# Patient Record
Sex: Male | Born: 1950 | Race: Black or African American | Hispanic: No | Marital: Single | Smoking: Never smoker
Health system: Southern US, Community
[De-identification: ages and names within clinical notes are randomized; demographics above are authoritative.]

## PROBLEM LIST (undated history)

## (undated) DIAGNOSIS — G629 Polyneuropathy, unspecified: Secondary | ICD-10-CM

## (undated) DIAGNOSIS — Z9989 Dependence on other enabling machines and devices: Secondary | ICD-10-CM

## (undated) DIAGNOSIS — I428 Other cardiomyopathies: Secondary | ICD-10-CM

## (undated) DIAGNOSIS — F329 Major depressive disorder, single episode, unspecified: Secondary | ICD-10-CM

## (undated) DIAGNOSIS — E875 Hyperkalemia: Secondary | ICD-10-CM

## (undated) DIAGNOSIS — R350 Frequency of micturition: Secondary | ICD-10-CM

## (undated) DIAGNOSIS — N400 Enlarged prostate without lower urinary tract symptoms: Secondary | ICD-10-CM

## (undated) DIAGNOSIS — G4733 Obstructive sleep apnea (adult) (pediatric): Secondary | ICD-10-CM

## (undated) DIAGNOSIS — N289 Disorder of kidney and ureter, unspecified: Secondary | ICD-10-CM

## (undated) DIAGNOSIS — E119 Type 2 diabetes mellitus without complications: Secondary | ICD-10-CM

## (undated) DIAGNOSIS — I10 Essential (primary) hypertension: Secondary | ICD-10-CM

## (undated) DIAGNOSIS — I5022 Chronic systolic (congestive) heart failure: Secondary | ICD-10-CM

## (undated) DIAGNOSIS — I509 Heart failure, unspecified: Secondary | ICD-10-CM

## (undated) DIAGNOSIS — R3915 Urgency of urination: Secondary | ICD-10-CM

## (undated) DIAGNOSIS — M2550 Pain in unspecified joint: Secondary | ICD-10-CM

## (undated) DIAGNOSIS — G4730 Sleep apnea, unspecified: Secondary | ICD-10-CM

## (undated) DIAGNOSIS — I4891 Unspecified atrial fibrillation: Principal | ICD-10-CM

## (undated) DIAGNOSIS — J9620 Acute and chronic respiratory failure, unspecified whether with hypoxia or hypercapnia: Secondary | ICD-10-CM

## (undated) DIAGNOSIS — I5032 Chronic diastolic (congestive) heart failure: Secondary | ICD-10-CM

## (undated) DIAGNOSIS — I259 Chronic ischemic heart disease, unspecified: Secondary | ICD-10-CM

## (undated) DIAGNOSIS — I2510 Atherosclerotic heart disease of native coronary artery without angina pectoris: Secondary | ICD-10-CM

## (undated) DIAGNOSIS — E785 Hyperlipidemia, unspecified: Secondary | ICD-10-CM

## (undated) DIAGNOSIS — F32A Depression, unspecified: Secondary | ICD-10-CM

## (undated) DIAGNOSIS — E0590 Thyrotoxicosis, unspecified without thyrotoxic crisis or storm: Secondary | ICD-10-CM

## (undated) HISTORY — DX: Atherosclerotic heart disease of native coronary artery without angina pectoris: I25.10

## (undated) HISTORY — DX: Sleep apnea, unspecified: G47.30

## (undated) HISTORY — DX: Disorder of kidney and ureter, unspecified: N28.9

## (undated) HISTORY — DX: Depression, unspecified: F32.A

## (undated) HISTORY — DX: Chronic ischemic heart disease, unspecified: I25.9

## (undated) HISTORY — DX: Major depressive disorder, single episode, unspecified: F32.9

## (undated) HISTORY — DX: Hyperkalemia: E87.5

## (undated) HISTORY — DX: Essential (primary) hypertension: I10

## (undated) HISTORY — DX: Hyperlipidemia, unspecified: E78.5

## (undated) HISTORY — DX: Unspecified atrial fibrillation: I48.91

## (undated) HISTORY — DX: Heart failure, unspecified: I50.9

## (undated) HISTORY — DX: Other cardiomyopathies: I42.8

## (undated) HISTORY — DX: Thyrotoxicosis, unspecified without thyrotoxic crisis or storm: E05.90

## (undated) HISTORY — DX: Chronic diastolic (congestive) heart failure: I50.32

## (undated) HISTORY — DX: Benign prostatic hyperplasia without lower urinary tract symptoms: N40.0

## (undated) HISTORY — DX: Acute and chronic respiratory failure, unspecified whether with hypoxia or hypercapnia: J96.20

## (undated) HISTORY — DX: Chronic systolic (congestive) heart failure: I50.22

---

## 2009-07-20 HISTORY — PX: CARDIAC CATHETERIZATION: SHX172

## 2009-08-04 ENCOUNTER — Ambulatory Visit: Payer: Self-pay | Admitting: Internal Medicine

## 2009-08-04 ENCOUNTER — Inpatient Hospital Stay (HOSPITAL_COMMUNITY): Admission: EM | Admit: 2009-08-04 | Discharge: 2009-08-12 | Payer: Self-pay | Admitting: Emergency Medicine

## 2009-08-05 ENCOUNTER — Encounter (INDEPENDENT_AMBULATORY_CARE_PROVIDER_SITE_OTHER): Payer: Self-pay | Admitting: Internal Medicine

## 2009-08-12 ENCOUNTER — Encounter: Payer: Self-pay | Admitting: Physician Assistant

## 2009-08-15 ENCOUNTER — Telehealth: Payer: Self-pay | Admitting: Internal Medicine

## 2009-08-15 ENCOUNTER — Encounter: Payer: Self-pay | Admitting: Internal Medicine

## 2009-08-26 ENCOUNTER — Telehealth: Payer: Self-pay | Admitting: Internal Medicine

## 2009-08-26 ENCOUNTER — Ambulatory Visit: Payer: Self-pay | Admitting: Physician Assistant

## 2009-08-26 DIAGNOSIS — I10 Essential (primary) hypertension: Secondary | ICD-10-CM | POA: Insufficient documentation

## 2009-08-26 DIAGNOSIS — E785 Hyperlipidemia, unspecified: Secondary | ICD-10-CM

## 2009-08-26 DIAGNOSIS — G4730 Sleep apnea, unspecified: Secondary | ICD-10-CM | POA: Insufficient documentation

## 2009-08-26 DIAGNOSIS — N259 Disorder resulting from impaired renal tubular function, unspecified: Secondary | ICD-10-CM | POA: Insufficient documentation

## 2009-08-26 DIAGNOSIS — I482 Chronic atrial fibrillation, unspecified: Secondary | ICD-10-CM

## 2009-08-26 DIAGNOSIS — I2510 Atherosclerotic heart disease of native coronary artery without angina pectoris: Secondary | ICD-10-CM | POA: Insufficient documentation

## 2009-08-26 DIAGNOSIS — I5022 Chronic systolic (congestive) heart failure: Secondary | ICD-10-CM

## 2009-08-26 DIAGNOSIS — E119 Type 2 diabetes mellitus without complications: Secondary | ICD-10-CM

## 2009-08-26 DIAGNOSIS — I4891 Unspecified atrial fibrillation: Secondary | ICD-10-CM

## 2009-08-26 HISTORY — DX: Sleep apnea, unspecified: G47.30

## 2009-08-26 HISTORY — DX: Chronic systolic (congestive) heart failure: I50.22

## 2009-08-26 HISTORY — DX: Unspecified atrial fibrillation: I48.91

## 2009-08-26 LAB — CONVERTED CEMR LAB: Hgb A1c MFr Bld: 7.4 %

## 2009-08-27 ENCOUNTER — Encounter: Payer: Self-pay | Admitting: Physician Assistant

## 2009-08-27 LAB — CONVERTED CEMR LAB
CO2: 22 meq/L (ref 19–32)
Calcium: 8.8 mg/dL (ref 8.4–10.5)
Glucose, Bld: 141 mg/dL — ABNORMAL HIGH (ref 70–99)
Potassium: 4.4 meq/L (ref 3.5–5.3)
Sodium: 143 meq/L (ref 135–145)

## 2009-08-31 ENCOUNTER — Encounter: Payer: Self-pay | Admitting: Internal Medicine

## 2009-09-01 ENCOUNTER — Encounter: Payer: Self-pay | Admitting: Internal Medicine

## 2009-09-02 ENCOUNTER — Telehealth: Payer: Self-pay | Admitting: Internal Medicine

## 2009-09-03 ENCOUNTER — Ambulatory Visit: Payer: Self-pay | Admitting: Internal Medicine

## 2009-09-12 ENCOUNTER — Telehealth: Payer: Self-pay | Admitting: Physician Assistant

## 2009-09-16 ENCOUNTER — Telehealth: Payer: Self-pay | Admitting: Physician Assistant

## 2009-09-23 ENCOUNTER — Telehealth (INDEPENDENT_AMBULATORY_CARE_PROVIDER_SITE_OTHER): Payer: Self-pay | Admitting: *Deleted

## 2009-09-23 ENCOUNTER — Ambulatory Visit: Payer: Self-pay | Admitting: Internal Medicine

## 2009-09-23 ENCOUNTER — Ambulatory Visit: Payer: Self-pay | Admitting: Physician Assistant

## 2009-09-23 DIAGNOSIS — R059 Cough, unspecified: Secondary | ICD-10-CM | POA: Insufficient documentation

## 2009-09-23 DIAGNOSIS — R05 Cough: Secondary | ICD-10-CM | POA: Insufficient documentation

## 2009-10-07 ENCOUNTER — Ambulatory Visit: Payer: Self-pay | Admitting: Physician Assistant

## 2009-10-07 ENCOUNTER — Encounter: Payer: Self-pay | Admitting: Physician Assistant

## 2009-10-07 DIAGNOSIS — R079 Chest pain, unspecified: Secondary | ICD-10-CM

## 2009-10-07 LAB — CONVERTED CEMR LAB
BUN: 17 mg/dL (ref 6–23)
CO2: 23 meq/L (ref 19–32)
Chloride: 101 meq/L (ref 96–112)
Creatinine, Ser: 1.06 mg/dL (ref 0.40–1.50)
Potassium: 4 meq/L (ref 3.5–5.3)

## 2009-10-08 ENCOUNTER — Encounter: Payer: Self-pay | Admitting: Physician Assistant

## 2009-10-08 ENCOUNTER — Ambulatory Visit: Payer: Self-pay | Admitting: Internal Medicine

## 2009-10-08 DIAGNOSIS — I259 Chronic ischemic heart disease, unspecified: Secondary | ICD-10-CM

## 2009-10-08 HISTORY — DX: Chronic ischemic heart disease, unspecified: I25.9

## 2009-10-17 ENCOUNTER — Encounter: Payer: Self-pay | Admitting: Physician Assistant

## 2009-10-19 ENCOUNTER — Encounter: Payer: Self-pay | Admitting: Physician Assistant

## 2009-10-21 ENCOUNTER — Encounter: Payer: Self-pay | Admitting: Physician Assistant

## 2009-10-27 ENCOUNTER — Encounter: Payer: Self-pay | Admitting: Physician Assistant

## 2009-11-11 ENCOUNTER — Encounter: Payer: Self-pay | Admitting: Physician Assistant

## 2009-12-08 ENCOUNTER — Telehealth: Payer: Self-pay | Admitting: Physician Assistant

## 2009-12-10 ENCOUNTER — Ambulatory Visit: Payer: Self-pay | Admitting: Physician Assistant

## 2009-12-10 DIAGNOSIS — L03019 Cellulitis of unspecified finger: Secondary | ICD-10-CM

## 2009-12-10 LAB — CONVERTED CEMR LAB: Hgb A1c MFr Bld: 7.7 %

## 2009-12-11 ENCOUNTER — Encounter: Payer: Self-pay | Admitting: Physician Assistant

## 2009-12-15 ENCOUNTER — Ambulatory Visit: Payer: Self-pay | Admitting: Physician Assistant

## 2009-12-15 ENCOUNTER — Telehealth: Payer: Self-pay | Admitting: Physician Assistant

## 2010-01-16 ENCOUNTER — Encounter: Payer: Self-pay | Admitting: Physician Assistant

## 2010-01-16 ENCOUNTER — Telehealth: Payer: Self-pay | Admitting: Physician Assistant

## 2010-02-11 ENCOUNTER — Telehealth: Payer: Self-pay | Admitting: Physician Assistant

## 2010-02-12 ENCOUNTER — Encounter (INDEPENDENT_AMBULATORY_CARE_PROVIDER_SITE_OTHER): Payer: Self-pay | Admitting: *Deleted

## 2010-03-09 ENCOUNTER — Encounter: Payer: Self-pay | Admitting: Physician Assistant

## 2010-03-30 ENCOUNTER — Encounter: Payer: Self-pay | Admitting: Internal Medicine

## 2010-05-04 ENCOUNTER — Telehealth: Payer: Self-pay | Admitting: Internal Medicine

## 2010-05-26 ENCOUNTER — Encounter: Payer: Self-pay | Admitting: Internal Medicine

## 2010-06-11 ENCOUNTER — Encounter (INDEPENDENT_AMBULATORY_CARE_PROVIDER_SITE_OTHER): Payer: Self-pay | Admitting: *Deleted

## 2010-07-21 ENCOUNTER — Encounter: Payer: Self-pay | Admitting: Physician Assistant

## 2010-07-30 ENCOUNTER — Telehealth: Payer: Self-pay | Admitting: Physician Assistant

## 2010-07-30 ENCOUNTER — Telehealth: Payer: Self-pay | Admitting: Internal Medicine

## 2010-08-03 ENCOUNTER — Encounter (INDEPENDENT_AMBULATORY_CARE_PROVIDER_SITE_OTHER): Payer: Self-pay | Admitting: *Deleted

## 2010-08-03 ENCOUNTER — Encounter: Payer: Self-pay | Admitting: Physician Assistant

## 2010-08-12 ENCOUNTER — Telehealth: Payer: Self-pay | Admitting: Physician Assistant

## 2010-08-13 ENCOUNTER — Encounter: Payer: Self-pay | Admitting: Physician Assistant

## 2010-08-13 ENCOUNTER — Ambulatory Visit: Payer: Self-pay | Admitting: Cardiology

## 2010-08-13 ENCOUNTER — Ambulatory Visit (HOSPITAL_COMMUNITY): Admission: RE | Admit: 2010-08-13 | Discharge: 2010-08-13 | Payer: Self-pay | Admitting: Internal Medicine

## 2010-08-13 ENCOUNTER — Encounter: Payer: Self-pay | Admitting: Internal Medicine

## 2010-08-13 ENCOUNTER — Ambulatory Visit: Payer: Self-pay

## 2010-08-21 ENCOUNTER — Encounter: Payer: Self-pay | Admitting: Physician Assistant

## 2010-09-03 ENCOUNTER — Ambulatory Visit: Payer: Self-pay | Admitting: Physician Assistant

## 2010-09-03 LAB — CONVERTED CEMR LAB: Blood Glucose, Fingerstick: 125

## 2010-09-04 LAB — CONVERTED CEMR LAB
ALT: 16 units/L (ref 0–53)
AST: 17 units/L (ref 0–37)
Albumin: 3.6 g/dL (ref 3.5–5.2)
Basophils Absolute: 0 10*3/uL (ref 0.0–0.1)
Basophils Relative: 0 % (ref 0–1)
CO2: 30 meq/L (ref 19–32)
Calcium: 8.7 mg/dL (ref 8.4–10.5)
Chloride: 103 meq/L (ref 96–112)
Creatinine, Urine: 231.7 mg/dL
Lymphocytes Relative: 33 % (ref 12–46)
MCHC: 32.3 g/dL (ref 30.0–36.0)
Microalb Creat Ratio: 39.5 mg/g — ABNORMAL HIGH (ref 0.0–30.0)
Microalb, Ur: 9.15 mg/dL — ABNORMAL HIGH (ref 0.00–1.89)
Neutro Abs: 3.4 10*3/uL (ref 1.7–7.7)
Neutrophils Relative %: 56 % (ref 43–77)
Potassium: 4 meq/L (ref 3.5–5.3)
RBC: 4.65 M/uL (ref 4.22–5.81)
RDW: 14.1 % (ref 11.5–15.5)
Total Protein: 7.6 g/dL (ref 6.0–8.3)

## 2010-09-07 ENCOUNTER — Encounter: Payer: Self-pay | Admitting: Physician Assistant

## 2010-09-23 ENCOUNTER — Ambulatory Visit: Payer: Self-pay | Admitting: Internal Medicine

## 2010-09-28 ENCOUNTER — Encounter: Payer: Self-pay | Admitting: Physician Assistant

## 2010-10-16 ENCOUNTER — Telehealth: Payer: Self-pay | Admitting: Internal Medicine

## 2010-10-22 ENCOUNTER — Telehealth (INDEPENDENT_AMBULATORY_CARE_PROVIDER_SITE_OTHER): Payer: Self-pay | Admitting: Nurse Practitioner

## 2010-11-24 ENCOUNTER — Ambulatory Visit: Payer: Self-pay | Admitting: Internal Medicine

## 2010-12-24 ENCOUNTER — Ambulatory Visit: Admit: 2010-12-24 | Payer: Self-pay | Admitting: Internal Medicine

## 2011-01-19 NOTE — Miscellaneous (Signed)
  Clinical Lists Changes  Observations: Added new observation of PAST MED HX: Current Problems:  CORONARY ARTERY DISEASE, S/P PTCA (ICD-414.9) 08/07/2009 RENAL INSUFFICIENCY (ICD-588.9) SLEEP APNEA (ICD-780.57) MORBID OBESITY (ICD-278.01) ATRIAL FIBRILLATION (ICD-427.31) CHRONIC SYSTOLIC HEART FAILURE (ICD-428.22) CARDIOMYOPATHY, PRIMARY (ICD-425.4) CORONARY ATHEROSCLEROSIS NATIVE CORONARY ARTERY (ICD-414.01) DYSLIPIDEMIA (ICD-272.4) ESSENTIAL HYPERTENSION, BENIGN (ICD-401.1) DIABETES MELLITUS, TYPE II (ICD-250.00) Echo 8.25.2011:  Mod LVH; EF 50%; Mod LAE; RV dilation and ? dysfxn; mild RAE;    (08/21/2010 16:13)       Past History:  Past Medical History: Current Problems:  CORONARY ARTERY DISEASE, S/P PTCA (ICD-414.9) 08/07/2009 RENAL INSUFFICIENCY (ICD-588.9) SLEEP APNEA (ICD-780.57) MORBID OBESITY (ICD-278.01) ATRIAL FIBRILLATION (ICD-427.31) CHRONIC SYSTOLIC HEART FAILURE (ICD-428.22) CARDIOMYOPATHY, PRIMARY (ICD-425.4) CORONARY ATHEROSCLEROSIS NATIVE CORONARY ARTERY (ICD-414.01) DYSLIPIDEMIA (ICD-272.4) ESSENTIAL HYPERTENSION, BENIGN (ICD-401.1) DIABETES MELLITUS, TYPE II (ICD-250.00) Echo 8.25.2011:  Mod LVH; EF 50%; Mod LAE; RV dilation and ? dysfxn; mild RAE;

## 2011-01-19 NOTE — Letter (Signed)
Summary: Appointment - Missed  [facility]     [place], [place]    Phone:   Fax:      June 11, 2010 MRN: [id number]   NISHANTH MCCAUGHAN [address] [place], [place]  [address]   Dear Mr. GUERCIO,  Our records indicate you missed your appointment on June 7,2011with Dr. Gala Romney. It is very important that we reach you to reschedule this appointment. We look forward to participating in your health care needs. Please contact us at the number listed above at your earliest convenience to reschedule this appointment.     Sincerely,   Lorne Skeens  [facility] Scheduling Team

## 2011-01-19 NOTE — Progress Notes (Signed)
Summary: prior auth for Pradaxa  Phone Note From Pharmacy   Summary of Call: received fax from [facility] that pts Pradaxa needed prior auth, called [phone] and received autherization, pharmacy is aware  Initial call taken by: Meredith Staggers, RN,  October 16, 2010 3:39 PM

## 2011-01-19 NOTE — Miscellaneous (Signed)
Summary: [facility]  [facility]   Imported By: Kassie Mends 12/23/2009 10:06:57  _____________________________________________________________________  External Attachment:    Type:   Image     Comment:   External Document

## 2011-01-19 NOTE — Medication Information (Signed)
Summary: [facility]  [facility]   Imported By: Arta Bruce 08/03/2010 11:17:20  _____________________________________________________________________  External Attachment:    Type:   Image     Comment:   External Document

## 2011-01-19 NOTE — Progress Notes (Signed)
Summary: set up for echo  Phone Note Call from Patient Call back at Home Phone [phone] Call back at [phone]   Caller: Patient Reason for Call: Talk to Nurse Details for Reason: pt would like set up for echo  Initial call taken by: Lorne Skeens,  May 04, 2010 2:39 PM  Follow-up for Phone Call        This pt needs to be scheduled for an echocardiogram (Dx 428.22, 414.9, 427.31, 401.1) and follow-up appt with Dr Gala Romney. Order placed for echo.  I will forward this information to [place] to contact the pt with an appt.  Follow-up by: Julieta Gutting, RN, BSN,  May 04, 2010 2:42 PM

## 2011-01-19 NOTE — Letter (Signed)
Summary: *HSN Results Follow up  [facility]  [address] [place], [place] [address]   Phone: [phone]  Fax: [phone]      08/03/2010   [name] [address] [place], [place]  [address]   Dear  [name],                            ____S.Drinkard,FNP   ____D. Gore,FNP       ____B. McPherson,MD   ____V. Rankins,MD    ____E. Mulberry,MD    ____N. Daphine Deutscher, FNP  ____D. Reche Dixon, MD    ____K. Philipp Deputy, MD    ____Other     This letter is to inform you that your recent test(s):  _______Pap Smear    _______Lab Test     _______X-ray    _______ is within acceptable limits  _______ requires a medication change  _______ requires a follow-up lab visit  ____X___ requires a follow-up visit with your provider   Comments: We have been trying to reach you.  Please contact the office for a schedule appointment.       _________________________________________________________ If you have any questions, please contact our office                     Sincerely,  [name] [facility]

## 2011-01-19 NOTE — Progress Notes (Signed)
  Phone Note Outgoing Call   Summary of Call: Rec'd DME form for diabetic supplies. I have not seen this patient in months and have requested a f/u. Will not complete forms until we know his status (i.e. is he going to come back for f/u?). Forms in your basket.  Initial call taken by: Brynda Rim,  August 12, 2010 10:37 PM  Follow-up for Phone Call        ATTEMPTING TO CONTACT # D/C Follow-up by: Michelle Nasuti,  August 13, 2010 4:44 PM  Additional Follow-up for Phone Call Additional follow up Details #1::        number is disconnected Additional Follow-up by: Armenia Shannon,  August 14, 2010 8:48 AM    Additional Follow-up for Phone Call Additional follow up Details #2::    spoke with pt and he will come see you on sept 15.... i will keep paper work Follow-up by: Armenia Shannon,  August 18, 2010 2:11 PM

## 2011-01-19 NOTE — Letter (Signed)
Summary: [facility]  [facility]   Imported By: Arta Bruce 07/21/2010 08:22:24  _____________________________________________________________________  External Attachment:    Type:   Image     Comment:   External Document

## 2011-01-19 NOTE — Assessment & Plan Note (Signed)
Summary: rov/ gd   Primary Provider:  Tereso Newcomer, PA-C   History of Present Illness: [name] is a 60 year old morbidly obese male with DM2, chronic AF, CAD, systolic HF returns for f/u.   Admitted in 8/10 found to be in rapid atrial fibrillation. Echo showed a depressed ejection fraction with mild to moderate reduced EF. 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 He had markedly elevated LV filling pressures and pulmonary venous hypertension with normal pulmonary vascular resistance suggesting he will normalize his pulmonary pressures with adequate diuresis. Diuresed > 50 pounds.  Doing well. Very compliant with medicines. Continues to watch diet closely. Denies chest pain or palpitations. No significant dyspnea or wheezing. Weight down to 371 (was previously over 400). No orthopnea, PND, orthopnea or palpitations. Not treated with coumadin due to poor f/u. Now on ASA 325.   Echo last month with EF 50% LV dilated with some RV dysfunction (not seen well)   Current Medications (verified): 1)  Potassium Chloride Crys Cr 20 Meq Cr-Tabs (Potassium Chloride Crys Cr) .... Take One Table Three Time Daily 2)  Furosemide 80 Mg Tabs (Furosemide) .... One Tab By Mouth Twice Daily 3)  Digoxin 0.25 Mg Tabs (Digoxin) .... One Tab By Mouth Everyday 4)  Aspirin 325 Mg Tabs (Aspirin) .... One Tab Daily 5)  Norvasc 5 Mg Tabs (Amlodipine Besylate) .... One Tab  By Mouth Every Day 6)  Cozaar 100 Mg Tabs (Losartan Potassium) .... Take 1 Tablet By Mouth Once A Day  (Stop Taking Lisinopril) 7)  Carvedilol 25 Mg Tabs (Carvedilol) .... Two Tabs By Mouth Twice Daily 8)  Lantus 100 Unit/ml Soln (Insulin Glargine) .... 40 Units At Bedtime 9)  Novolog 100 Unit/ml Soln (Insulin Aspart) .... As Directed in Sliding Scale (Stop Taking Novolin) 10)  Bd Insulin Syringe 27.5g X 5/8" 2 Ml Misc (Insulin Syringe-Needle U-100) .... As Directed 11)   Pravastatin Sodium 80 Mg Tabs (Pravastatin Sodium) .... Take 1 Tab By Mouth At Bedtime  Allergies (verified): No Known Drug Allergies  Past History:  Past Medical History: Last updated: 08/21/2010 Current Problems:  CORONARY ARTERY DISEASE, S/P PTCA (ICD-414.9) 08/07/2009 RENAL INSUFFICIENCY (ICD-588.9) SLEEP APNEA (ICD-780.57) MORBID OBESITY (ICD-278.01) ATRIAL FIBRILLATION (ICD-427.31) CHRONIC SYSTOLIC HEART FAILURE (ICD-428.22) CARDIOMYOPATHY, PRIMARY (ICD-425.4) CORONARY ATHEROSCLEROSIS NATIVE CORONARY ARTERY (ICD-414.01) DYSLIPIDEMIA (ICD-272.4) ESSENTIAL HYPERTENSION, BENIGN (ICD-401.1) DIABETES MELLITUS, TYPE II (ICD-250.00) Echo 8.25.2011:  Mod LVH; EF 50%; Mod LAE; RV dilation and ? dysfxn; mild RAE;   Review of Systems       As per HPI and past medical history; otherwise all systems negative.   Vital Signs:  Patient profile:   60 year old male Height:      64 inches Weight:      371 pounds BMI:     63.91 Pulse rate:   64 / minute Resp:     18 per minute BP sitting:   118 / 78  (right arm)  Vitals Entered By: Marrion Coy, CNA (September 23, 2010 2:33 PM)  Physical Exam  General:  Morbidly obese. No resp difficulty HEENT: normal Neck: supple. no obvious JVD. Carotids 2+ bilat; not bruits. No lymphadenopathy or thryomegaly appreciated. Cor: PMI nonpalpable. IrregularNo rubs, gallops, murmur. Lungs: clear Abdomen: obese. nontender, nondistended.  Extremities: no cyanosis, clubbing, rash, edema Neuro: alert & orientedx3, cranial nerves grossly intact. moves all 4 extremities w/o difficulty. affect pleasant    Impression & Recommendations:  Problem # 1:  CHRONIC SYSTOLIC HEART FAILURE (  ZHY-865.78) Doing very well. EF recovered. Volume status well controlled. On good meds. Continue current regimen.   Problem # 2:  ATRIAL FIBRILLATION (ICD-427.31) Well rate controlled. CHADS score 3. Given decreased need for f/u I think Pradaxa will be very good option for  him. Will stop ASA and start Pradaxa.  Problem # 3:  MORBID OBESITY (ICD-278.01) Congratulated him on his weight loss and urged him to continue these efforts.  Patient Instructions: 1)  Your physician recommends that you schedule a follow-up appointment in: 6 months with Dr Gala Romney 2)  Your physician recommends that you return for lab work in: 10 days and 6 months  CBC  427.31  v58.69 3)  Your physician has recommended you make the following change in your medication: Stop Asa and start Pradaxa 150 mg one twice a day Prescriptions: PRADAXA 150 MG CAPS (DABIGATRAN ETEXILATE MESYLATE) one twice a day  #60 x 11   Entered by:   Charolotte Capuchin, RN   Authorized by:   Dolores Patty, MD, [facility]   Signed by:   Charolotte Capuchin, RN on 09/23/2010   Method used:   Faxed to ...       [facility] (retail)       [address]       [place], [place]  [address]       Ph: [phone]       Fax: [phone]   RxID:   [id number]

## 2011-01-19 NOTE — Progress Notes (Signed)
  Phone Note Outgoing Call   Summary of Call: Got a form to sign so patient can get a hospital bed. Why does he need a hospital bed.? Initial call taken by: Brynda Rim,  February 11, 2010 9:44 PM  Follow-up for Phone Call        spoke with pt and disgard form pt said he doesn't need a bed but does need diabetic shoes Follow-up by: Armenia Shannon,  February 12, 2010 5:11 PM  Additional Follow-up for Phone Call Additional follow up Details #1::        Whoever he wants shoes from, they should send me a Rx to sign. Additional Follow-up by: Tereso Newcomer PA-C,  February 12, 2010 5:15 PM    Additional Follow-up for Phone Call Additional follow up Details #2::    spoke with pt and he will do Follow-up by: Armenia Shannon,  February 19, 2010 11:50 AM

## 2011-01-19 NOTE — Progress Notes (Signed)
  Phone Note Outgoing Call   Summary of Call: Pt getting meds thru [facility]. Mfg for Digoxin is different. Patient needs to come in for ECG 2 weeks after starting new bottle of Digoxin and have a digoxin level checked. Initial call taken by: Brynda Rim,  January 16, 2010 8:45 AM  Follow-up for Phone Call        pt is aware, pt has not picked up med yet Follow-up by: Armenia Shannon,  January 19, 2010 10:17 AM  Additional Follow-up for Phone Call Additional follow up Details #1::        Left message on answering machine for pt to return call  Additional Follow-up by: Vesta Mixer CMA,  January 19, 2010 10:18 AM

## 2011-01-19 NOTE — Letter (Signed)
Summary: DISABILITY VERIFICATION FORM/[facility]  DISABILITY VERIFICATION FORM/[facility]   Imported By: Arta Bruce 01/26/2010 16:48:47  _____________________________________________________________________  External Attachment:    Type:   Image     Comment:   External Document

## 2011-01-19 NOTE — Assessment & Plan Note (Signed)
Summary: F/U PER PA WEAVER / NS   Vital Signs:  Patient profile:   60 year old male Height:      64 inches Weight:      376 pounds BMI:     64.77 Temp:     98.2 degrees F oral Pulse rate:   76 / minute Pulse rhythm:   regular Resp:     18 per minute BP sitting:   132 / 82  (left arm) Cuff size:   large  Vitals Entered By: Armenia Shannon (September 03, 2010 2:37 PM) CC: f/u..... meds reviewed...Marland KitchenMarland Kitchen pt wants to know sugar ranges, Hypertension Management Is Patient Diabetic? Yes Pain Assessment Patient in pain? no      CBG Result 125  Does patient need assistance? Functional Status Self care Ambulation Normal   Primary Care Provider:  Tereso Newcomer, PA-C  CC:  f/u..... meds reviewed...Marland KitchenMarland Kitchen pt wants to know sugar ranges and Hypertension Management.  History of Present Illness: Here for f/u. Sugars less than 200 by his report. Just takes a set dose of Novolog . . .does not understand sliding scale.  Discussed with him and he seems to think he can follow. Seems to be following a good diet.  Wants to lose weight.  Discussed weight watchers and increasing activity.   Hypertension History:      He denies chest pain, dyspnea with exertion, PND, peripheral edema, and syncope.  He notes no problems with any antihypertensive medication side effects.        Positive major cardiovascular risk factors include male age 65 years old or older, diabetes, hyperlipidemia, and hypertension.  Negative major cardiovascular risk factors include non-tobacco-user status.        Positive history for target organ damage include ASHD (either angina/prior MI/prior CABG) and cardiac end organ damage (either CHF or LVH).     Problems Prior to Update: 1)  Paronychia, Finger  (ICD-681.02) 2)  Coronary Artery Disease, S/p Ptca  (ICD-414.9) 3)  Chest Pain  (ICD-786.50) 4)  Cough  (ICD-786.2) 5)  Renal Insufficiency  (ICD-588.9) 6)  Sleep Apnea  (ICD-780.57) 7)  Morbid Obesity  (ICD-278.01) 8)  Atrial  Fibrillation  (ICD-427.31) 9)  Chronic Systolic Heart Failure  (ICD-428.22) 10)  Cardiomyopathy, Primary  (ICD-425.4) 11)  Coronary Atherosclerosis Native Coronary Artery  (ICD-414.01) 12)  Dyslipidemia  (ICD-272.4) 13)  Essential Hypertension, Benign  (ICD-401.1) 14)  Diabetes Mellitus, Type II  (ICD-250.00)  Current Medications (verified): 1)  Potassium Chloride Crys Cr 20 Meq Cr-Tabs (Potassium Chloride Crys Cr) .... Take One Table Three Time Daily 2)  Furosemide 80 Mg Tabs (Furosemide) .... One Tab By Mouth Twice Daily 3)  Digoxin 0.25 Mg Tabs (Digoxin) .... One Tab By Mouth Everyday 4)  Aspirin 325 Mg Tabs (Aspirin) .... One Tab Daily 5)  Norvasc 5 Mg Tabs (Amlodipine Besylate) .... One Tab  By Mouth Every Day 6)  Cozaar 100 Mg Tabs (Losartan Potassium) .... Take 1 Tablet By Mouth Once A Day  (Stop Taking Lisinopril) 7)  Carvedilol 25 Mg Tabs (Carvedilol) .... Two Tabs By Mouth Twice Daily 8)  Lantus 100 Unit/ml Soln (Insulin Glargine) .... 35 Units At Bedtime 9)  Novolog 100 Unit/ml Soln (Insulin Aspart) .... As Directed in Sliding Scale (Stop Taking Novolin) 10)  Bd Insulin Syringe 27.5g X 5/8" 2 Ml Misc (Insulin Syringe-Needle U-100) .... As Directed 11)  Pravastatin Sodium 80 Mg Tabs (Pravastatin Sodium) .... Take 1 Tab By Mouth At Bedtime  Allergies (verified): No Known Drug  Allergies  Past History:  Past Medical History: Last updated: 08/21/2010 Current Problems:  CORONARY ARTERY DISEASE, S/P PTCA (ICD-414.9) 08/07/2009 RENAL INSUFFICIENCY (ICD-588.9) SLEEP APNEA (ICD-780.57) MORBID OBESITY (ICD-278.01) ATRIAL FIBRILLATION (ICD-427.31) CHRONIC SYSTOLIC HEART FAILURE (ICD-428.22) CARDIOMYOPATHY, PRIMARY (ICD-425.4) CORONARY ATHEROSCLEROSIS NATIVE CORONARY ARTERY (ICD-414.01) DYSLIPIDEMIA (ICD-272.4) ESSENTIAL HYPERTENSION, BENIGN (ICD-401.1) DIABETES MELLITUS, TYPE II (ICD-250.00) Echo 8.25.2011:  Mod LVH; EF 50%; Mod LAE; RV dilation and ? dysfxn; mild RAE;   Past  Surgical History: Last updated: 08/26/2009 Denies surgical history  Physical Exam  General:  alert, well-developed, and well-nourished.   Head:  normocephalic and atraumatic.   Neck:  no jvd at 90 degrees  Lungs:  normal breath sounds, no crackles, and no wheezes.   Heart:  normal rate and irregular rhythm.   Abdomen:  soft.   Neurologic:  alert & oriented X3 and cranial nerves II-XII intact.   Psych:  normally interactive.    Diabetes Management Exam:    Foot Exam (with socks and/or shoes not present):       Sensory-Monofilament:          Left foot: normal          Right foot: normal       Inspection:          Left foot: abnormal             Comments: minimal callus formation          Right foot: abnormal             Comments: minimal callus formation        Nails:          Left foot: fungal infection          Right foot: fungal infection   Impression & Recommendations:  Problem # 1:  DIABETES MELLITUS, TYPE II (ICD-250.00) A1C high with concomitant medical probs and CAD . . .goal 7-7.5 adjust Lantus to 40  His updated medication list for this problem includes:    Aspirin 325 Mg Tabs (Aspirin) ..... One tab daily    Cozaar 100 Mg Tabs (Losartan potassium) .Marland Kitchen... Take 1 tablet by mouth once a day  (stop taking lisinopril)    Lantus 100 Unit/ml Soln (Insulin glargine) .Marland KitchenMarland KitchenMarland KitchenMarland Kitchen 40 units at bedtime    Novolog 100 Unit/ml Soln (Insulin aspart) .Marland Kitchen... As directed in sliding scale (stop taking novolin)  Orders: Capillary Blood Glucose/CBG (04540) Hemoglobin A1C (98119) T-Comprehensive Metabolic Panel (14782-95621) T-Urinalysis (30865-78469) T-Urine Microalbumin w/creat. ratio [phone])  Problem # 2:  DYSLIPIDEMIA (ICD-272.4) schedule FLP in 3 mos  His updated medication list for this problem includes:    Pravastatin Sodium 80 Mg Tabs (Pravastatin sodium) .Marland Kitchen... Take 1 tab by mouth at bedtime  Orders: T-Comprehensive Metabolic Panel (02725-36644)  Problem # 3:   ESSENTIAL HYPERTENSION, BENIGN (ICD-401.1) close to goal  previously optimal continue current meds  His updated medication list for this problem includes:    Furosemide 80 Mg Tabs (Furosemide) ..... One tab by mouth twice daily    Norvasc 5 Mg Tabs (Amlodipine besylate) ..... One tab  by mouth every day    Cozaar 100 Mg Tabs (Losartan potassium) .Marland Kitchen... Take 1 tablet by mouth once a day  (stop taking lisinopril)    Carvedilol 25 Mg Tabs (Carvedilol) .Marland Kitchen..Marland Kitchen Two tabs by mouth twice daily  Orders: T-CBC w/Diff (03474-25956) T-TSH [phone])  Complete Medication List: 1)  Potassium Chloride Crys Cr 20 Meq Cr-tabs (Potassium chloride crys cr) .... Take one table three time daily  2)  Furosemide 80 Mg Tabs (Furosemide) .... One tab by mouth twice daily 3)  Digoxin 0.25 Mg Tabs (Digoxin) .... One tab by mouth everyday 4)  Aspirin 325 Mg Tabs (Aspirin) .... One tab daily 5)  Norvasc 5 Mg Tabs (Amlodipine besylate) .... One tab  by mouth every day 6)  Cozaar 100 Mg Tabs (Losartan potassium) .... Take 1 tablet by mouth once a day  (stop taking lisinopril) 7)  Carvedilol 25 Mg Tabs (Carvedilol) .... Two tabs by mouth twice daily 8)  Lantus 100 Unit/ml Soln (Insulin glargine) .... 40 units at bedtime 9)  Novolog 100 Unit/ml Soln (Insulin aspart) .... As directed in sliding scale (stop taking novolin) 10)  Bd Insulin Syringe 27.5g X 5/8" 2 Ml Misc (Insulin syringe-needle u-100) .... As directed 11)  Pravastatin Sodium 80 Mg Tabs (Pravastatin sodium) .... Take 1 tab by mouth at bedtime  Hypertension Assessment/Plan:      The patient's hypertensive risk group is category C: Target organ damage and/or diabetes.  Today's blood pressure is 132/82.    Patient Instructions: 1)  Schedule FLP and LFTs in 3 mos (272.4) 2)  Schedule CPE with Scott in 4 months. 3)  Increase Lantus to 40 units at bedtime. 4)  Follow the sliding scale sheet for your Novolog.  Check sugar before each meal and give yourself  the units listed on your sheet. Prescriptions: LANTUS 100 UNIT/ML SOLN (INSULIN GLARGINE) 40 units at bedtime  #1 mo supply x 11   Entered and Authorized by:   Tereso Newcomer PA-C   Signed by:   Tereso Newcomer PA-C on 09/03/2010   Method used:   Print then Give to Patient   RxID:   [id number]           Diabetic Foot Exam    10-g (5.07) Semmes-Weinstein Monofilament Test Performed by: Armenia Shannon          Right Foot          Left Foot Visual Inspection               Test Control      normal         normal Site 1         normal         normal Site 2         normal         normal Site 3         normal         normal Site 4         normal         normal Site 5         normal         normal Site 6         normal         normal Site 7         normal         normal Site 8         normal         normal Site 9         normal         normal Site 10         normal         normal  Impression      normal         normal   Laboratory Results   Blood Tests  HGBA1C: 8.0%   (Normal Range: Non-Diabetic - 3-6%   Control Diabetic - 6-8%) CBG Random:: 125mg /dL

## 2011-01-19 NOTE — Letter (Signed)
Summary: [facility]  [facility]   Imported By: Arta Bruce 10/07/2010 10:35:36  _____________________________________________________________________  External Attachment:    Type:   Image     Comment:   External Document

## 2011-01-19 NOTE — Progress Notes (Signed)
Summary: Query:  Refill mail-order six months?  Phone Note Outgoing Call   Summary of Call: Do you want to refill his meds for 6 months from [facility]?  Last seen 9/11. Initial call taken by: Dutch Quint RN,  October 22, 2010 5:29 PM  Follow-up for Phone Call        yes, ok to refill rx in your basket - fax back Follow-up by: Lehman Prom FNP,  October 22, 2010 6:00 PM  Additional Follow-up for Phone Call Additional follow up Details #1::        Rxs faxed.  Dutch Quint RN  October 26, 2010 11:08 AM     Prescriptions: PRAVASTATIN SODIUM 80 MG TABS (PRAVASTATIN SODIUM) Take 1 tab by mouth at bedtime  #30 x 5   Entered and Authorized by:   Lehman Prom FNP   Signed by:   Lehman Prom FNP on 10/23/2010   Method used:   Historical   RxID:   [id number] POTASSIUM CHLORIDE CRYS CR 20 MEQ CR-TABS (POTASSIUM CHLORIDE CRYS CR) Take one table three time daily  #90 x 5   Entered and Authorized by:   Lehman Prom FNP   Signed by:   Lehman Prom FNP on 10/23/2010   Method used:   Historical   RxID:   [id number] NOVOLOG 100 UNIT/ML SOLN (INSULIN ASPART) as directed in sliding scale (stop taking Novolin)  #1 vial x 5   Entered and Authorized by:   Lehman Prom FNP   Signed by:   Lehman Prom FNP on 10/23/2010   Method used:   Historical   RxID:   [id number] LANTUS 100 UNIT/ML SOLN (INSULIN GLARGINE) 40 units at bedtime  #1 mo supply x 5   Entered and Authorized by:   Lehman Prom FNP   Signed by:   Lehman Prom FNP on 10/23/2010   Method used:   Historical   RxID:   [id number] FUROSEMIDE 80 MG TABS (FUROSEMIDE) one tab by mouth twice daily  #60 x 5   Entered and Authorized by:   Lehman Prom FNP   Signed by:   Lehman Prom FNP on 10/23/2010   Method used:   Historical   RxID:   [id number] DIGOXIN 0.25 MG TABS (DIGOXIN) one tab by mouth everyday  #30 x 5   Entered and Authorized by:   Lehman Prom  FNP   Signed by:   Lehman Prom FNP on 10/23/2010   Method used:   Historical   RxID:   [id number] COZAAR 100 MG TABS (LOSARTAN POTASSIUM) Take 1 tablet by mouth once a day  (stop taking lisinopril)  #30 x 5   Entered and Authorized by:   Lehman Prom FNP   Signed by:   Lehman Prom FNP on 10/23/2010   Method used:   Historical   RxID:   [id number] CARVEDILOL 25 MG TABS (CARVEDILOL) two tabs by mouth twice daily  #120 x 5   Entered and Authorized by:   Lehman Prom FNP   Signed by:   Lehman Prom FNP on 10/23/2010   Method used:   Historical   RxID:   [id number] NORVASC 5 MG TABS (AMLODIPINE BESYLATE) one tab  by mouth every day  #30 x 5   Entered and Authorized by:   Lehman Prom FNP   Signed by:   Lehman Prom FNP on 10/23/2010   Method used:   Historical   RxID:   [id number]

## 2011-01-19 NOTE — Letter (Signed)
Summary: DISABILITY VERIFICATION FORM FOR SECTION 8 PROPERTIES  DISABILITY VERIFICATION FORM FOR SECTION 8 PROPERTIES   Imported By: Arta Bruce 02/03/2010 11:15:13  _____________________________________________________________________  External Attachment:    Type:   Image     Comment:   External Document

## 2011-01-19 NOTE — Miscellaneous (Signed)
Summary: echo orders  Clinical Lists Changes  Orders: Added new Referral order of Echocardiogram (Echo) - Signed

## 2011-01-19 NOTE — Letter (Signed)
Summary: Appointment - Missed  [facility]     [place], [place]    Phone:   Fax:      February 12, 2010 MRN: [id number]   [name] [address] [place], [place]  [address]   Dear [name],  Our records indicate you missed your appointment on  2-22-2011with  Dr.  Gala Romney  It is very important that we reach you to reschedule this appointment. We look forward to participating in your health care needs. Please contact us at the number listed above at your earliest convenience to reschedule this appointment.     Sincerely,      Lorne Skeens  [facility] Scheduling Team

## 2011-01-19 NOTE — Progress Notes (Signed)
Summary: needs f/u  Phone Note Outgoing Call   Summary of Call: I have not seen him since Dec. He needs follow up for his diabetes. Sent refills to his pharmacy for 3 mos only. Rx in you basket. Initial call taken by: Tereso Newcomer PA-C,  July 30, 2010 1:17 PM  Follow-up for Phone Call        Left message on answering machine for pt to call back.Marland KitchenMarland KitchenMarland KitchenArmenia Shannon  July 30, 2010 4:40 PM  number is disconnected. ... [place] Shannon  July 31, 2010 4:10 PM  number is disconnected. will mail letter [place] Shannon  August 03, 2010 9:15 AM     New/Updated Medications: PRAVASTATIN SODIUM 80 MG TABS (PRAVASTATIN SODIUM) Take 1 tab by mouth at bedtime Prescriptions: ASPIRIN 325 MG TABS (ASPIRIN) one tab daily  #30 x 5   Entered and Authorized by:   Tereso Newcomer PA-C   Signed by:   Tereso Newcomer PA-C on 07/30/2010   Method used:   Printed then faxed to ...         RxID:   [id number] PRAVASTATIN SODIUM 80 MG TABS (PRAVASTATIN SODIUM) Take 1 tab by mouth at bedtime  #30 x 2   Entered and Authorized by:   Tereso Newcomer PA-C   Signed by:   Tereso Newcomer PA-C on 07/30/2010   Method used:   Printed then faxed to ...         RxID:   [id number]

## 2011-01-19 NOTE — Progress Notes (Signed)
Summary: Want to R/S echo  Phone Note Call from Patient Call back at [phone]   Caller: Patient Summary of Call: Pt calling want to R/S his echo Initial call taken by: Judie Grieve,  July 30, 2010 10:43 AM  Follow-up for Phone Call        he missed his echo appt on 6/2, can you please call him and resch it he just needs it before his appt w/Dr Bensimhon in Oct. thanks Meredith Staggers, RN  July 30, 2010 10:51 AM   echo resch to 8/25

## 2011-01-19 NOTE — Letter (Signed)
Summary: NOVOLOG INSULIN SLIDING SCALE  NOVOLOG INSULIN SLIDING SCALE   Imported By: Arta Bruce 09/04/2010 12:32:12  _____________________________________________________________________  External Attachment:    Type:   Image     Comment:   External Document

## 2011-01-19 NOTE — Letter (Signed)
Summary: Generic Letter  Architectural technologist, Main Office  [address]   [place], [place] [address]   Phone: [phone]  Fax: [phone]        May 26, 2010 MRN: [id number]    [name] [address] [place], [place]  [address]    Dear [name],  Our records indicate that you missed your appointment for your echocardiogram and your follow-up with Dr Gala Romney.  It is Dr Chloeann Alfred's recommendation that you have this test and follow-up with him.  Please call our office as soon as possible to reschedule your appointments.    Sincerely,  Meredith Staggers, RN Arvilla Meres, MD  This letter has been electronically signed by your physician.

## 2011-01-19 NOTE — Letter (Signed)
Summary: [employer]//SHOES  [employer]//SHOES   Imported By: Arta Bruce 03/09/2010 11:32:47  _____________________________________________________________________  External Attachment:    Type:   Image     Comment:   External Document

## 2011-01-19 NOTE — Medication Information (Signed)
Summary: RX Folder//[facility]  RX F[facility]//[facility]   Imported By: Arta Bruce 02/09/2010 12:37:44  _____________________________________________________________________  External Attachment:    Type:   Image     Comment:   External Document

## 2011-01-19 NOTE — Letter (Signed)
Summary: Generic Letter  Architectural technologist, Main Office  [address]   [place], [place] [address]   Phone: [phone]  Fax: [phone]        March 30, 2010 MRN: [id number]    [name] [address] [place], [place]  [address]    Dear Mr. CUTRONE,  Our records indicate that you missed your appointment for your echocardiogram.  It is Dr Jerrell Hart's recommendation that you have this test.  Please give our office a call to reschedule the appointment.      Sincerely,  Meredith Staggers, RN Arvilla Meres, MD  This letter has been electronically signed by your physician.

## 2011-01-19 NOTE — Letter (Signed)
Summary: [place]/TRANSTHORACIC ECHOCARDIOGRAM  [place]/TRANSTHORACIC ECHOCARDIOGRAM   Imported By: Arta Bruce 08/27/2010 12:48:19  _____________________________________________________________________  External Attachment:    Type:   Image     Comment:   External Document

## 2011-02-22 ENCOUNTER — Encounter: Payer: Self-pay | Admitting: Nurse Practitioner

## 2011-02-22 ENCOUNTER — Encounter (INDEPENDENT_AMBULATORY_CARE_PROVIDER_SITE_OTHER): Payer: Self-pay | Admitting: Nurse Practitioner

## 2011-02-22 DIAGNOSIS — M25519 Pain in unspecified shoulder: Secondary | ICD-10-CM | POA: Insufficient documentation

## 2011-02-22 LAB — CONVERTED CEMR LAB
Blood in Urine, dipstick: NEGATIVE
HDL goal, serum: 40 mg/dL
Nitrite: NEGATIVE
OCCULT 1: NEGATIVE
Protein, U semiquant: NEGATIVE
Specific Gravity, Urine: 1.015
Urobilinogen, UA: 0.2

## 2011-02-23 ENCOUNTER — Encounter (INDEPENDENT_AMBULATORY_CARE_PROVIDER_SITE_OTHER): Payer: Self-pay | Admitting: Internal Medicine

## 2011-02-23 ENCOUNTER — Encounter (INDEPENDENT_AMBULATORY_CARE_PROVIDER_SITE_OTHER): Payer: Self-pay | Admitting: Nurse Practitioner

## 2011-02-23 DIAGNOSIS — E0590 Thyrotoxicosis, unspecified without thyrotoxic crisis or storm: Secondary | ICD-10-CM | POA: Insufficient documentation

## 2011-02-23 HISTORY — DX: Thyrotoxicosis, unspecified without thyrotoxic crisis or storm: E05.90

## 2011-02-23 LAB — CONVERTED CEMR LAB
PSA: 0.81 ng/mL (ref <=4.00)
TSH: <0.008 microintl units/mL — ABNORMAL LOW (ref 0.350–4.500)

## 2011-02-24 ENCOUNTER — Other Ambulatory Visit (HOSPITAL_COMMUNITY): Payer: Self-pay | Admitting: Internal Medicine

## 2011-02-24 DIAGNOSIS — E0590 Thyrotoxicosis, unspecified without thyrotoxic crisis or storm: Secondary | ICD-10-CM

## 2011-02-25 ENCOUNTER — Encounter (INDEPENDENT_AMBULATORY_CARE_PROVIDER_SITE_OTHER): Payer: Self-pay | Admitting: Nurse Practitioner

## 2011-02-26 ENCOUNTER — Encounter (INDEPENDENT_AMBULATORY_CARE_PROVIDER_SITE_OTHER): Payer: Self-pay | Admitting: Nurse Practitioner

## 2011-02-26 LAB — CONVERTED CEMR LAB
LDL Cholesterol: 73 mg/dL (ref 0–99)
Triglycerides: 90 mg/dL (ref <150)
VLDL: 18 mg/dL (ref 0–40)

## 2011-03-01 ENCOUNTER — Encounter (INDEPENDENT_AMBULATORY_CARE_PROVIDER_SITE_OTHER): Payer: Self-pay | Admitting: Nurse Practitioner

## 2011-03-02 NOTE — Letter (Signed)
Summary: TEST ORDER FORM//ULTRASOUND  TEST ORDER FORM//ULTRASOUND   Imported By: Arta Bruce 02/25/2011 10:53:15  _____________________________________________________________________  External Attachment:    Type:   Image     Comment:   External Document

## 2011-03-02 NOTE — Letter (Signed)
Summary: MEDICAID TRAMSPORTATION VERIFICATION  MEDICAID TRAMSPORTATION VERIFICATION   Imported By: Arta Bruce 02/24/2011 10:48:47  _____________________________________________________________________  External Attachment:    Type:   Image     Comment:   External Document

## 2011-03-02 NOTE — Letter (Signed)
Summary: MED/SOLUTIONS APPROVED  MED/SOLUTIONS APPROVED   Imported By: Arta Bruce 02/24/2011 10:38:21  _____________________________________________________________________  External Attachment:    Type:   Image     Comment:   External Document

## 2011-03-02 NOTE — Letter (Signed)
Summary: TRANSPORTATION MEDICAID  TRANSPORTATION MEDICAID   Imported By: Arta Bruce 02/26/2011 11:09:46  _____________________________________________________________________  External Attachment:    Type:   Image     Comment:   External Document

## 2011-03-02 NOTE — Assessment & Plan Note (Signed)
Summary: Complete Physical Exam   Vital Signs:  Patient profile:   60 year old male Weight:      367.4 pounds BMI:     63.29 Temp:     97.5 degrees F oral Pulse rate:   60 / minute Pulse rhythm:   regular Resp:     20 per minute BP sitting:   138 / 84  (left arm) Cuff size:   large  Vitals Entered By: Levon Hedger (February 22, 2011 2:12 PM)  Nutrition Counseling: Patient's BMI is greater than 25 and therefore counseled on weight management options. CC: CPE, Hypertension Management, Lipid Management, CHF Management Is Patient Diabetic? Yes Pain Assessment Patient in pain? yes     Location: shoulder CBG Result 209  Does patient need assistance? Functional Status Self care Ambulation Normal  Vision Screening:Left eye w/o correction: 20 / 20-1 Right Eye w/o correction: 20 / 20 Both eyes w/o correction:  20/ 20+5        Vision Entered By: Levon Hedger (February 22, 2011 2:31 PM)   Primary Care Provider:  Tereso Newcomer, PA-C  CC:  CPE, Hypertension Management, Lipid Management, and CHF Management.  History of Present Illness:  Pt into the office for a complete physical exam  Optho - pt wears glasses for reading glasses.  no c/o blurred vision or problems with his eye.  He has not had a recent eye exam  Dental - no recent dental exam.  no current problems with teeth  tdap - unsure of the exact date of last injection  Colonscopy - never had before.  No family history of colon cancer.   No problems with bowel.    CHF History:      He denies headache, chest pain, palpitations, and peripheral edema.  Daily weights are being checked.  He understands fluid management and sodium restriction and is following this regimen.  The patient expresses understanding of the treatment plan and his medications.  He admits to being compliant with his medications.  ADL's are being done without symptoms or restrictions.  The patient has not been enrolled in the CHF Eduction Program.   He admits to side effects from medications.    Diabetes Management History:      The patient is a 60 years old male who comes in for evaluation of DM Type 2.  He is (or has been) enrolled in the "Diabetic Education Program".  He states lack of understanding of dietary principles and is not following his diet appropriately.  No sensory loss is reported.  Self foot exams are not being performed.  He is checking home blood sugars.  He says that he is exercising.        Hypoglycemic symptoms are not occurring.  No hyperglycemic symptoms are reported.  Other comments include: pt checks his blood sugar at least once per day.        No changes have been made to his treatment plan since last visit.    Hypertension History:      He complains of palpitations, but denies headache and chest pain.  He notes no problems with any antihypertensive medication side effects.        Positive major cardiovascular risk factors include male age 61 years old or older, diabetes, hyperlipidemia, and hypertension.  Negative major cardiovascular risk factors include non-tobacco-user status.        Positive history for target organ damage include ASHD (either angina/prior MI/prior CABG) and cardiac end organ damage (  either CHF or LVH).  Further assessment for target organ damage reveals no history of stroke/TIA, peripheral vascular disease, renal insufficiency, or hypertensive retinopathy.    Lipid Management History:      Positive NCEP/ATP III risk factors include male age 4 years old or older, diabetes, hypertension, and ASHD (either angina/prior MI/prior CABG).  Negative NCEP/ATP III risk factors include non-tobacco-user status, no prior stroke/TIA, and no peripheral vascular disease.        He expresses no side effects from his lipid-lowering medication.  The patient denies any symptoms to suggest myopathy or liver disease.  Comments: pt is NOT fasting today for labs.     Habits & Providers  Alcohol-Tobacco-Diet      Alcohol drinks/day: 0     Tobacco Status: never  Exercise-Depression-Behavior     Does Patient Exercise: no     Exercise Counseling: to improve exercise regimen     Have you felt down or hopeless? no     Have you felt little pleasure in things? no     Depression Counseling: not indicated; screening negative for depression     Drug Use: no  Medications Prior to Update: 1)  Potassium Chloride Crys Cr 20 Meq Cr-Tabs (Potassium Chloride Crys Cr) .... Take One Table Three Time Daily 2)  Furosemide 80 Mg Tabs (Furosemide) .... One Tab By Mouth Twice Daily 3)  Digoxin 0.25 Mg Tabs (Digoxin) .... One Tab By Mouth Everyday 4)  Norvasc 5 Mg Tabs (Amlodipine Besylate) .... One Tab  By Mouth Every Day 5)  Cozaar 100 Mg Tabs (Losartan Potassium) .... Take 1 Tablet By Mouth Once A Day  (Stop Taking Lisinopril) 6)  Carvedilol 25 Mg Tabs (Carvedilol) .... Two Tabs By Mouth Twice Daily 7)  Lantus 100 Unit/ml Soln (Insulin Glargine) .... 40 Units At Bedtime 8)  Novolog 100 Unit/ml Soln (Insulin Aspart) .... As Directed in Sliding Scale (Stop Taking Novolin) 9)  Bd Insulin Syringe 27.5g X 5/8" 2 Ml Misc (Insulin Syringe-Needle U-100) .... As Directed 10)  Pravastatin Sodium 80 Mg Tabs (Pravastatin Sodium) .... Take 1 Tab By Mouth At Bedtime 11)  Pradaxa 150 Mg Caps (Dabigatran Etexilate Mesylate) .... One Twice A Day  Current Medications (verified): 1)  Potassium Chloride Crys Cr 20 Meq Cr-Tabs (Potassium Chloride Crys Cr) .... Take One Table Three Time Daily 2)  Furosemide 80 Mg Tabs (Furosemide) .... One Tab By Mouth Twice Daily 3)  Digoxin 0.25 Mg Tabs (Digoxin) .... One Tab By Mouth Everyday 4)  Norvasc 5 Mg Tabs (Amlodipine Besylate) .... One Tab  By Mouth Every Day 5)  Cozaar 100 Mg Tabs (Losartan Potassium) .... Take 1 Tablet By Mouth Once A Day  (Stop Taking Lisinopril) 6)  Carvedilol 25 Mg Tabs (Carvedilol) .... Two Tabs By Mouth Twice Daily 7)  Lantus 100 Unit/ml Soln (Insulin Glargine) .... 40  Units At Bedtime 8)  Novolog 100 Unit/ml Soln (Insulin Aspart) .... As Directed in Sliding Scale (Stop Taking Novolin) 9)  Bd Insulin Syringe 27.5g X 5/8" 2 Ml Misc (Insulin Syringe-Needle U-100) .... As Directed 10)  Pravastatin Sodium 80 Mg Tabs (Pravastatin Sodium) .... Take 1 Tab By Mouth At Bedtime 11)  Pradaxa 150 Mg Caps (Dabigatran Etexilate Mesylate) .... One Twice A Day  Allergies (verified): No Known Drug Allergies  Social History: Does Patient Exercise:  no  Review of Systems General:  Denies fever. Eyes:  Denies blurring. ENT:  Denies earache. CV:  Denies chest pain or discomfort. Resp:  Denies cough.  GI:  Denies abdominal pain, constipation, nausea, and vomiting. GU:  Complains of nocturia and urinary frequency; denies discharge; takes lasix twice per day. MS:  Complains of joint pain; bil shoulder R>L. Derm:  Denies dryness. Neuro:  Denies headaches. Psych:  Denies anxiety and depression.  Physical Exam  General:  alert.  morbid obesity Head:  normocephalic.   Eyes:  pupils round.   Ears:  R ear normal and L ear normal.   Nose:  no nasal discharge.   Mouth:  fair dentition.   Neck:  supple.   Chest Wall:  no masses.   Breasts:  no gynecomastia.   Lungs:  normal breath sounds.   Heart:  normal rate and irregular rhythm.   Abdomen:  obese Rectal:  no external abnormalities.   Genitalia:  uncircumcised and no scrotal masses.  difficult exam due to increased body habitus Prostate:  no gland enlargement and tender.   Extremities:  trace left pedal edema and trace right pedal edema.   Neurologic:  alert & oriented X3.   Skin:  varicosities to lower extremities dry skin to lower legs  Psych:  Oriented X3.     Shoulder/Elbow Exam  General:    obese.    Shoulder Exam:    Right:    Inspection:  Normal    Palpation:  Abnormal       Location:  right deltoid    Stability:  stable    Tenderness:  no    Swelling:  no    Erythema:  no    Left:     Inspection:  Normal       Location:  left deltoid    Stability:  stable    Tenderness:  no    Swelling:  no    Erythema:  no   Impression & Recommendations:  Problem # 1:  HEALTH MAINTENANCE EXAM (ICD-V70.0) rec vision - pt can come get retasure routine dental exam Orders: Hemoccult Guaiac-1 spec.(in office) (82270) Vision Screening (04540)  Problem # 2:  DIABETES MELLITUS, TYPE II (ICD-250.00)  His updated medication list for this problem includes:    Cozaar 100 Mg Tabs (Losartan potassium) .Marland Kitchen... Take 1 tablet by mouth once a day  (stop taking lisinopril)    Lantus 100 Unit/ml Soln (Insulin glargine) .Marland KitchenMarland KitchenMarland KitchenMarland Kitchen 40 units at bedtime    Novolog 100 Unit/ml Soln (Insulin aspart) .Marland Kitchen... As directed in sliding scale (stop taking novolin)  Orders: UA Dipstick w/o Micro (manual) (98119) T-PSA [phone]) T- Hemoglobin A1C (30865-78469) Capillary Blood Glucose/CBG (62952)  Problem # 3:  ESSENTIAL HYPERTENSION, BENIGN (ICD-401.1)  His updated medication list for this problem includes:    Furosemide 80 Mg Tabs (Furosemide) ..... One tab by mouth twice daily    Norvasc 5 Mg Tabs (Amlodipine besylate) ..... One tab  by mouth every day    Cozaar 100 Mg Tabs (Losartan potassium) .Marland Kitchen... Take 1 tablet by mouth once a day  (stop taking lisinopril)    Carvedilol 25 Mg Tabs (Carvedilol) .Marland Kitchen..Marland Kitchen Two tabs by mouth twice daily  Orders: EKG w/ Interpretation (93000)  Problem # 4:  ATRIAL FIBRILLATION (ICD-427.31)  His updated medication list for this problem includes:    Digoxin 0.25 Mg Tabs (Digoxin) ..... One tab by mouth everyday    Norvasc 5 Mg Tabs (Amlodipine besylate) ..... One tab  by mouth every day    Carvedilol 25 Mg Tabs (Carvedilol) .Marland Kitchen..Marland Kitchen Two tabs by mouth twice daily  Problem # 5:  MORBID OBESITY (ICD-278.01)  Orders:  T-TSH [id number]) T- Hemoglobin A1C (09811-91478)  Problem # 6:  SHOULDER PAIN, BILATERAL (ICD-719.41) will give short course of anti-inflammatories His  updated medication list for this problem includes:    Diclofenac Sodium 75 Mg Tbec (Diclofenac sodium) ..... One tablet by mouth two times a day for shoulder for 1 week  Complete Medication List: 1)  Potassium Chloride Crys Cr 20 Meq Cr-tabs (Potassium chloride crys cr) .... Take one table three time daily 2)  Furosemide 80 Mg Tabs (Furosemide) .... One tab by mouth twice daily 3)  Digoxin 0.25 Mg Tabs (Digoxin) .... One tab by mouth everyday 4)  Norvasc 5 Mg Tabs (Amlodipine besylate) .... One tab  by mouth every day 5)  Cozaar 100 Mg Tabs (Losartan potassium) .... Take 1 tablet by mouth once a day  (stop taking lisinopril) 6)  Carvedilol 25 Mg Tabs (Carvedilol) .... Two tabs by mouth twice daily 7)  Lantus 100 Unit/ml Soln (Insulin glargine) .... 40 units at bedtime 8)  Novolog 100 Unit/ml Soln (Insulin aspart) .... As directed in sliding scale (stop taking novolin) 9)  Bd Insulin Syringe 27.5g X 5/8" 2 Ml Misc (Insulin syringe-needle u-100) .... As directed 10)  Pravastatin Sodium 80 Mg Tabs (Pravastatin sodium) .... Take 1 tab by mouth at bedtime 11)  Pradaxa 150 Mg Caps (Dabigatran etexilate mesylate) .... One twice a day 12)  Diclofenac Sodium 75 Mg Tbec (Diclofenac sodium) .... One tablet by mouth two times a day for shoulder for 1 week  CHF Assessment/Plan:      The patient's current weight is 367.4 pounds.  His previous weight was 371 pounds.    Diabetes Management Assessment/Plan:      The following lipid goals have been established for the patient: Total cholesterol goal of 200; LDL cholesterol goal of 70; HDL cholesterol goal of 40; Triglyceride goal of 150.  His blood pressure goal is < 130/80.    Hypertension Assessment/Plan:      The patient's hypertensive risk group is category C: Target organ damage and/or diabetes.  Today's blood pressure is 138/84.  His blood pressure goal is < 130/80.  Lipid Assessment/Plan:      Based on NCEP/ATP III, the patient's risk factor category  is "history of coronary disease, peripheral vascular disease, cerebrovascular disease, or aortic aneurysm along with either diabetes, current smoker, or LDL > 130 plus HDL < 40 plus triglycerides > 200".  The patient's lipid goals are as follows: Total cholesterol goal is 200; LDL cholesterol goal is 70; HDL cholesterol goal is 40; Triglyceride goal is 150.    Patient Instructions: 1)  Schedule a fasting lab appointment for lipids - 272.4 2)  No food after midnight before this visit. 3)  Schedule an appointment for retasure testing.   4)  (both lab and retasure can be done on the same day if possible) 5)  Diabetes - Your Hgba1c will be checked today. 6)  it should be less than 8.0 7)  Blood pressure - 138/84 today.  keep taking medication as ordered 8)  Remember that you are due for a colonscopy.  This is a screening test that is due at age 69 so you are a few years behind.  When you are ready for this test then inform this office  9)  Tetanus - likely this is due.  You should get every 10 years. none available in office today so you can get at next visist. 10)  Follow up with provider in 4 months for  diabetes.  will need u/a, hgba1c, cbg.  will also need tdap. Prescriptions: DICLOFENAC SODIUM 75 MG TBEC (DICLOFENAC SODIUM) One tablet by mouth two times a day for shoulder for 1 week  #14 x 0   Entered and Authorized by:   Lehman Prom FNP   Signed by:   Lehman Prom FNP on 02/22/2011   Method used:   Printed then faxed to ...       [facility] (retail)       [address]       [place], [place]  [address]       Ph: [phone]       Fax: [phone]   RxID:   [id number]    Orders Added: 1)  Est. Patient age 54-64 [43] 2)  UA Dipstick w/o Micro (manual) [81002] 3)  T-PSA [84132-44010] 4)  T-TSH [27253-66440] 5)  T- Hemoglobin A1C [83036-23375] 6)  EKG w/ Interpretation [93000] 7)  Hemoccult Guaiac-1 spec.(in office) [82270] 8)  Vision Screening  [99173] 9)  Capillary Blood Glucose/CBG [82948]    Laboratory Results   Urine Tests  Date/Time Received: February 22, 2011 2:39 PM   Routine Urinalysis   Color: yellow Glucose: negative   (Normal Range: Negative) Bilirubin: negative   (Normal Range: Negative) Ketone: trace (5)   (Normal Range: Negative) Spec. Gravity: 1.015   (Normal Range: 1.003-1.035) Blood: negative   (Normal Range: Negative) pH: 5.0   (Normal Range: 5.0-8.0) Protein: negative   (Normal Range: Negative) Urobilinogen: 0.2   (Normal Range: 0-1) Nitrite: negative   (Normal Range: Negative) Leukocyte Esterace: negative   (Normal Range: Negative)     Blood Tests     CBG Random:: 209mg /dL  Date/Time Received: February 22, 2011 3:18 PM   Stool - Occult Blood Hemmoccult #1: negative Date: 02/22/2011    Prevention & Chronic Care Immunizations   Influenza vaccine: Fluvax 3+  (09/23/2009)   Influenza vaccine deferral: Not available  (02/22/2011)    Tetanus booster: Not documented   Td booster deferral: Not available  (02/22/2011)    Pneumococcal vaccine: Not documented  Colorectal Screening   Hemoccult: Not documented    Colonoscopy: Not documented   Colonoscopy action/deferral: Deferred  (02/22/2011)  Other Screening   PSA: Not documented   PSA ordered.   Smoking status: never  (02/22/2011)  Diabetes Mellitus   HgbA1C: 8.0  (09/03/2010)    Eye exam: Not documented    Foot exam: yes  (09/03/2010)   High risk foot: Not documented   Foot care education: Not documented    Urine microalbumin/creatinine ratio: 39.5  (09/03/2010)  Lipids   Total Cholesterol: Not documented   LDL: Not documented   LDL Direct: Not documented   HDL: Not documented   Triglycerides: Not documented    SGOT (AST): 17  (09/03/2010)   SGPT (ALT): 16  (09/03/2010)   Alkaline phosphatase: 49  (09/03/2010)   Total bilirubin: 0.7  (09/03/2010)  Hypertension   Last Blood Pressure: 138 / 84  (02/22/2011)    Serum creatinine: 0.60  (09/03/2010)   Serum potassium 4.0  (09/03/2010)  Self-Management Support :    Diabetes self-management support: Not documented    Hypertension self-management support: Not documented    Lipid self-management support: Not documented     EKG  Procedure date:  02/22/2011  Findings:      atrial fibrillation abnormal left axis deviation incomplete right bundle branch block

## 2011-03-03 ENCOUNTER — Ambulatory Visit (HOSPITAL_COMMUNITY)
Admission: RE | Admit: 2011-03-03 | Discharge: 2011-03-03 | Disposition: A | Discharge status: Home / Self Care | Payer: Medicaid Other | Source: Ambulatory Visit | Attending: Internal Medicine | Admitting: Internal Medicine

## 2011-03-03 DIAGNOSIS — E041 Nontoxic single thyroid nodule: Secondary | ICD-10-CM | POA: Insufficient documentation

## 2011-03-03 DIAGNOSIS — E0590 Thyrotoxicosis, unspecified without thyrotoxic crisis or storm: Secondary | ICD-10-CM

## 2011-03-09 ENCOUNTER — Telehealth (INDEPENDENT_AMBULATORY_CARE_PROVIDER_SITE_OTHER): Payer: Self-pay | Admitting: Nurse Practitioner

## 2011-03-09 NOTE — Letter (Signed)
Summary: Lipid Letter  [facility]  [address] [place], [place] [address]   Phone: [phone]  Fax: [phone]    03/01/2011  [name] [address] [place], [place]  [address]  Dear [name]:  We have carefully reviewed your last lipid profile from 02/26/2011 and the results are noted below with a summary of recommendations for lipid management.    Cholesterol:       115     Goal: less than 200   HDL "good" Cholesterol:   24     Goal: greater than 40   LDL "bad" Cholesterol:   73     Goal: less than 70   Triglycerides:       90     Goal: less than 150     Cholesterol labs done during recent office visit are ok.  Keep taking cholesterol medications as ordered.     Current Medications: 1)    Potassium Chloride Crys Cr 20 Meq Cr-tabs (Potassium chloride crys cr) .... Take one table three time daily 2)    Furosemide 80 Mg Tabs (Furosemide) .... One tab by mouth twice daily 3)    Digoxin 0.25 Mg Tabs (Digoxin) .... One tab by mouth everyday 4)    Norvasc 5 Mg Tabs (Amlodipine besylate) .... One tab  by mouth every day 5)    Cozaar 100 Mg Tabs (Losartan potassium) .... Take 1 tablet by mouth once a day  (stop taking lisinopril) 6)    Carvedilol 25 Mg Tabs (Carvedilol) .... Two tabs by mouth twice daily 7)    Lantus 100 Unit/ml Soln (Insulin glargine) .... 40 units at bedtime 8)    Novolog 100 Unit/ml Soln (Insulin aspart) .... As directed in sliding scale (stop taking novolin) 9)    Bd Insulin Syringe 27.5g X 5/8" 2 Ml Misc (Insulin syringe-needle u-100) .... As directed 10)    Pravastatin Sodium 80 Mg Tabs (Pravastatin sodium) .... Take 1 tab by mouth at bedtime 11)    Pradaxa 150 Mg Caps (Dabigatran etexilate mesylate) .... One twice a day 12)    Diclofenac Sodium 75 Mg Tbec (Diclofenac sodium) .... One tablet by mouth two times a day for shoulder for 1 week  If you have any questions, please call. We appreciate being  able to work with you.   Sincerely,    [facility] Lehman Prom, FNP

## 2011-03-15 ENCOUNTER — Encounter: Payer: Self-pay | Admitting: Internal Medicine

## 2011-03-18 NOTE — Progress Notes (Signed)
Summary: Query:  Refill diclofenac?  Phone Note Outgoing Call   Summary of Call: Seen 02/22/11.  Diclofenac only filled x1.  Refill per protocol?  Received request for refills from [facility].   Initial call taken by: Dutch Quint RN,  March 09, 2011 4:33 PM  Follow-up for Phone Call        yes, ok to refill for 30 days supply. also pt has an open phone note - i need to see him to review u/s results Follow-up by: Lehman Prom FNP,  March 09, 2011 4:41 PM  Additional Follow-up for Phone Call Additional follow up Details #1::        Refill faxed to [facility].  Pt. notified.  Has f/u appt. 03/15/11.   Additional Follow-up by: Dutch Quint RN,  March 10, 2011 4:18 PM    New/Updated Medications: DICLOFENAC SODIUM 75 MG TBEC (DICLOFENAC SODIUM) One tablet by mouth two times a day for shoulder Prescriptions: DICLOFENAC SODIUM 75 MG TBEC (DICLOFENAC SODIUM) One tablet by mouth two times a day for shoulder  #30 x 0   Entered by:   Dutch Quint RN   Authorized by:   Lehman Prom FNP   Signed by:   Dutch Quint RN on 03/10/2011   Method used:   Historical   RxID:   [id number]

## 2011-03-22 ENCOUNTER — Other Ambulatory Visit: Payer: Self-pay | Admitting: *Deleted

## 2011-03-23 ENCOUNTER — Encounter: Payer: Self-pay | Admitting: Internal Medicine

## 2011-03-24 ENCOUNTER — Ambulatory Visit (INDEPENDENT_AMBULATORY_CARE_PROVIDER_SITE_OTHER): Payer: Medicaid Other | Admitting: Internal Medicine

## 2011-03-24 ENCOUNTER — Encounter: Payer: Self-pay | Admitting: Internal Medicine

## 2011-03-24 ENCOUNTER — Other Ambulatory Visit (INDEPENDENT_AMBULATORY_CARE_PROVIDER_SITE_OTHER): Payer: Medicaid Other | Admitting: *Deleted

## 2011-03-24 VITALS — BP 128/82 | HR 60 | Resp 18 | Ht 64.0 in | Wt 365.0 lb

## 2011-03-24 DIAGNOSIS — I4891 Unspecified atrial fibrillation: Secondary | ICD-10-CM

## 2011-03-24 DIAGNOSIS — I5022 Chronic systolic (congestive) heart failure: Secondary | ICD-10-CM

## 2011-03-24 DIAGNOSIS — I10 Essential (primary) hypertension: Secondary | ICD-10-CM

## 2011-03-24 DIAGNOSIS — I2510 Atherosclerotic heart disease of native coronary artery without angina pectoris: Secondary | ICD-10-CM

## 2011-03-24 DIAGNOSIS — Z79899 Other long term (current) drug therapy: Secondary | ICD-10-CM

## 2011-03-24 LAB — CBC WITH DIFFERENTIAL/PLATELET
Basophils Absolute: 0 10*3/uL (ref 0.0–0.1)
Hemoglobin: 13.4 g/dL (ref 13.0–17.0)
Lymphocytes Relative: 27.3 % (ref 12.0–46.0)
Monocytes Relative: 10.3 % (ref 3.0–12.0)
Neutro Abs: 3.5 10*3/uL (ref 1.4–7.7)
Platelets: 207 10*3/uL (ref 150.0–400.0)
RDW: 14.1 % (ref 11.5–14.6)

## 2011-03-24 NOTE — Assessment & Plan Note (Signed)
EF recovered. 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 Volume status looks good. Continue current medications.

## 2011-03-24 NOTE — Patient Instructions (Signed)
Your physician recommends that you schedule a follow-up appointment in: 6 months with Dr Bensimhon  

## 2011-03-24 NOTE — Progress Notes (Signed)
HPI: [name] is a 60 year old morbidly obese male with DM2, chronic AF, CAD, systolic HF returns for f/u.   Admitted in 8/10 found to be in rapid atrial fibrillation. Echo showed a depressed ejection fraction with mild to moderate reduced EF. 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 He had markedly elevated LV filling pressures and pulmonary venous hypertension with normal pulmonary vascular resistance.. Diuresed > 50 pounds. Echo 8/11 with EF 50% LV dilated with some RV dysfunction (not seen well)  Overall doing well. Very compliant with medicines. Continues to watch diet closely and not eating salt. Lost 10 pounds. Denies chest pain or palpitations. No significant dyspnea or wheezing. Weight down to 365 (was previously over 400). No orthopnea, PND, orthopnea or palpitations. Not treated with coumadin due to poor f/u. Now on Pradaxa. Not interested in CPAP.   Thyroid u/s March 12 showed complex left thyroid nodule. Has f/u with Dr. Daphine Deutscher later this month.   ROS: All systems negative except as listed in HPI, PMH and Problem List.  Past Medical History  Diagnosis Date  . 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 He had markedly elevated LV filling pressures and pulmonary venous hypertension with normal pulmonary vascular resistance suggesting he will normalize his pulmonary pressures with adequate diuresis.   . Renal insufficiency   . Sleep apnea   . Morbid obesity   . A-fib   . Chronic diastolic heart failure     Echo 8.25.2011:  Mod LVH; EF 50%; Mod LAE; RV dilation and ? dysfxn; mild RAE;   . Other primary cardiomyopathies   . Dyslipidemia   . Essential hypertension, benign   . Diabetes mellitus without mention of complication     Current Outpatient Prescriptions  Medication Sig Dispense Refill    . amLODipine (NORVASC) 5 MG tablet Take 5 mg by mouth daily.        . carvedilol (COREG) 25 MG tablet 25 mg. 2 tab po bid       . dabigatran (PRADAXA) 150 MG CAPS Take 150 mg by mouth every 12 (twelve) hours.        . diclofenac (VOLTAREN) 75 MG EC tablet Take 75 mg by mouth 2 (two) times daily.        . digoxin (LANOXIN) 0.25 MG tablet Take 250 mcg by mouth daily.        . furosemide (LASIX) 80 MG tablet Take 80 mg by mouth 2 (two) times daily.        . insulin aspart protamine-insulin aspart (NOVOLOG 70/30) (70-30) 100 UNIT/ML injection Inject into the skin. As directed       . insulin glargine (LANTUS) 100 UNIT/ML injection Inject into the skin. As directed       . losartan (COZAAR) 100 MG tablet Take 100 mg by mouth daily.        . potassium chloride SA (K-DUR,KLOR-CON) 20 MEQ tablet Take 20 mEq by mouth 3 (three) times daily.       . pravastatin (PRAVACHOL) 80 MG tablet Take 80 mg by mouth daily.           PHYSICAL EXAM: Filed Vitals:   03/24/11 0958  BP: 128/82  Pulse: 60  Resp: 18   General:  Well appearing. No resp difficulty HEENT: normal Neck: supple. JVP flat. Carotids 2+ bilaterally; no bruits. No lymphadenopathy or thryomegaly appreciated. Cor: PMI normal. Regular  rate & rhythm. No rubs, gallops or murmurs. Lungs: clear Abdomen: soft, nontender, nondistended. No hepatosplenomegaly. No bruits or masses. Good bowel sounds. Extremities: no cyanosis, clubbing, rash, edema Neuro: alert & orientedx3, cranial nerves grossly intact. Moves all 4 extremities w/o difficulty. Affect pleasant.    ECG: Atrial fib 62 iRBBB. Non-specific ST-T wave abnormalities.     ASSESSMENT & PLAN:

## 2011-03-24 NOTE — Assessment & Plan Note (Signed)
Chads-Vasc = 4. Rate controlled. On Pradaxa. Continue regimen.

## 2011-03-24 NOTE — Assessment & Plan Note (Signed)
Blood pressure well controlled. Continue current regimen.  

## 2011-03-24 NOTE — Assessment & Plan Note (Signed)
Non-obstructive. Continue RF management.

## 2011-03-24 NOTE — Assessment & Plan Note (Signed)
Congratulated him on weight loss to date and urged him to continue efforts with goal of 1/2 pound loss per week.

## 2011-03-27 LAB — POCT I-STAT 3, VENOUS BLOOD GAS (G3P V)
Acid-Base Excess: 9 mmol/L — ABNORMAL HIGH (ref 0.0–2.0)
Bicarbonate: 35.5 mEq/L — ABNORMAL HIGH (ref 20.0–24.0)
Bicarbonate: 36.4 mEq/L — ABNORMAL HIGH (ref 20.0–24.0)
O2 Saturation: 49 %
TCO2: 37 mmol/L (ref 0–100)
TCO2: 38 mmol/L (ref 0–100)
pH, Ven: 7.401 — ABNORMAL HIGH (ref 7.250–7.300)
pO2, Ven: 28 mmHg — CL (ref 30.0–45.0)

## 2011-03-27 LAB — BASIC METABOLIC PANEL
BUN: 14 mg/dL (ref 6–23)
BUN: 15 mg/dL (ref 6–23)
BUN: 16 mg/dL (ref 6–23)
BUN: 17 mg/dL (ref 6–23)
BUN: 25 mg/dL — ABNORMAL HIGH (ref 6–23)
CO2: 30 mEq/L (ref 19–32)
CO2: 31 mEq/L (ref 19–32)
CO2: 32 mEq/L (ref 19–32)
CO2: 32 mEq/L (ref 19–32)
CO2: 32 mEq/L (ref 19–32)
CO2: 35 mEq/L — ABNORMAL HIGH (ref 19–32)
Calcium: 8.9 mg/dL (ref 8.4–10.5)
Calcium: 8.9 mg/dL (ref 8.4–10.5)
Calcium: 9 mg/dL (ref 8.4–10.5)
Calcium: 9 mg/dL (ref 8.4–10.5)
Chloride: 100 mEq/L (ref 96–112)
Chloride: 102 mEq/L (ref 96–112)
Chloride: 91 mEq/L — ABNORMAL LOW (ref 96–112)
Chloride: 93 mEq/L — ABNORMAL LOW (ref 96–112)
Chloride: 97 mEq/L (ref 96–112)
Creatinine, Ser: 1.11 mg/dL (ref 0.4–1.5)
Creatinine, Ser: 1.21 mg/dL (ref 0.4–1.5)
Creatinine, Ser: 1.27 mg/dL (ref 0.4–1.5)
Creatinine, Ser: 1.66 mg/dL — ABNORMAL HIGH (ref 0.4–1.5)
GFR calc Af Amer: 52 mL/min — ABNORMAL LOW (ref >60)
GFR calc Af Amer: >60 mL/min (ref >60)
GFR calc Af Amer: >60 mL/min (ref >60)
GFR calc non Af Amer: 43 mL/min — ABNORMAL LOW (ref >60)
GFR calc non Af Amer: >60 mL/min (ref >60)
GFR calc non Af Amer: >60 mL/min (ref >60)
Glucose, Bld: 114 mg/dL — ABNORMAL HIGH (ref 70–99)
Glucose, Bld: 117 mg/dL — ABNORMAL HIGH (ref 70–99)
Glucose, Bld: 123 mg/dL — ABNORMAL HIGH (ref 70–99)
Glucose, Bld: 125 mg/dL — ABNORMAL HIGH (ref 70–99)
Glucose, Bld: 148 mg/dL — ABNORMAL HIGH (ref 70–99)
Potassium: 2.9 mEq/L — ABNORMAL LOW (ref 3.5–5.1)
Potassium: 3.6 mEq/L (ref 3.5–5.1)
Potassium: 3.7 mEq/L (ref 3.5–5.1)
Potassium: 3.7 mEq/L (ref 3.5–5.1)
Potassium: 4.1 mEq/L (ref 3.5–5.1)
Sodium: 135 mEq/L (ref 135–145)
Sodium: 138 mEq/L (ref 135–145)
Sodium: 138 mEq/L (ref 135–145)
Sodium: 139 mEq/L (ref 135–145)
Sodium: 141 mEq/L (ref 135–145)

## 2011-03-27 LAB — GLUCOSE, CAPILLARY
Glucose-Capillary: 104 mg/dL — ABNORMAL HIGH (ref 70–99)
Glucose-Capillary: 106 mg/dL — ABNORMAL HIGH (ref 70–99)
Glucose-Capillary: 106 mg/dL — ABNORMAL HIGH (ref 70–99)
Glucose-Capillary: 111 mg/dL — ABNORMAL HIGH (ref 70–99)
Glucose-Capillary: 122 mg/dL — ABNORMAL HIGH (ref 70–99)
Glucose-Capillary: 123 mg/dL — ABNORMAL HIGH (ref 70–99)
Glucose-Capillary: 129 mg/dL — ABNORMAL HIGH (ref 70–99)
Glucose-Capillary: 143 mg/dL — ABNORMAL HIGH (ref 70–99)
Glucose-Capillary: 144 mg/dL — ABNORMAL HIGH (ref 70–99)
Glucose-Capillary: 149 mg/dL — ABNORMAL HIGH (ref 70–99)
Glucose-Capillary: 160 mg/dL — ABNORMAL HIGH (ref 70–99)
Glucose-Capillary: 165 mg/dL — ABNORMAL HIGH (ref 70–99)

## 2011-03-27 LAB — CBC
HCT: 39.9 % (ref 39.0–52.0)
HCT: 40 % (ref 39.0–52.0)
HCT: 41.3 % (ref 39.0–52.0)
HCT: 43.9 % (ref 39.0–52.0)
HCT: 44.1 % (ref 39.0–52.0)
Hemoglobin: 12.9 g/dL — ABNORMAL LOW (ref 13.0–17.0)
Hemoglobin: 13 g/dL (ref 13.0–17.0)
Hemoglobin: 14.1 g/dL (ref 13.0–17.0)
Hemoglobin: 14.4 g/dL (ref 13.0–17.0)
MCHC: 32.2 g/dL (ref 30.0–36.0)
MCHC: 32.3 g/dL (ref 30.0–36.0)
MCHC: 32.6 g/dL (ref 30.0–36.0)
MCV: 93.7 fL (ref 78.0–100.0)
MCV: 93.9 fL (ref 78.0–100.0)
MCV: 94.1 fL (ref 78.0–100.0)
Platelets: 204 10*3/uL (ref 150–400)
Platelets: 214 10*3/uL (ref 150–400)
RBC: 4.68 MIL/uL (ref 4.22–5.81)
RDW: 15 % (ref 11.5–15.5)
RDW: 15.1 % (ref 11.5–15.5)
RDW: 15.3 % (ref 11.5–15.5)
RDW: 15.3 % (ref 11.5–15.5)
WBC: 5.8 10*3/uL (ref 4.0–10.5)
WBC: 7.2 10*3/uL (ref 4.0–10.5)

## 2011-03-27 LAB — POCT I-STAT 3, ART BLOOD GAS (G3+)
Bicarbonate: 31.8 mEq/L — ABNORMAL HIGH (ref 20.0–24.0)
TCO2: 33 mmol/L (ref 0–100)
pCO2 arterial: 44.7 mmHg (ref 35.0–45.0)
pH, Arterial: 7.46 — ABNORMAL HIGH (ref 7.350–7.450)

## 2011-03-27 LAB — MAGNESIUM: Magnesium: 1.8 mg/dL (ref 1.5–2.5)

## 2011-03-27 LAB — DIFFERENTIAL
Lymphocytes Relative: 19 % (ref 12–46)
Lymphs Abs: 1.4 10*3/uL (ref 0.7–4.0)
Neutrophils Relative %: 69 % (ref 43–77)

## 2011-03-27 LAB — BRAIN NATRIURETIC PEPTIDE: Pro B Natriuretic peptide (BNP): 299 pg/mL — ABNORMAL HIGH (ref 0.0–100.0)

## 2011-03-27 LAB — CK TOTAL AND CKMB (NOT AT ARMC): Relative Index: 1.4 (ref 0.0–2.5)

## 2011-03-27 LAB — LIPID PANEL
Total CHOL/HDL Ratio: 5.9 RATIO
VLDL: 16 mg/dL (ref 0–40)

## 2011-03-27 LAB — CARDIAC PANEL(CRET KIN+CKTOT+MB+TROPI): Troponin I: 0.06 ng/mL (ref 0.00–0.06)

## 2011-03-27 LAB — PHOSPHORUS: Phosphorus: 4 mg/dL (ref 2.3–4.6)

## 2011-03-27 LAB — PROTIME-INR
INR: 1.2 (ref 0.00–1.49)
Prothrombin Time: 15.5 seconds — ABNORMAL HIGH (ref 11.6–15.2)

## 2011-05-04 NOTE — H&P (Signed)
NAMEASHON, ROSENBERG            ACCOUNT NO.:  [id number]   MEDICAL RECORD NO.:  [id number]          PATIENT TYPE:  EMS   LOCATION:  MAJO                         FACILITY:  [facility]   PHYSICIAN:  Cherene Altes, MD     DATE OF BIRTH:  03-Nov-1951   DATE OF ADMISSION:  08/04/2009  DATE OF DISCHARGE:                              HISTORY & PHYSICAL   REASON FOR ADMISSION:  Atrial fibrillation with rapid ventricular  response.   CHIEF COMPLAINT:  I had left arm discomfort for 2 days.   HISTORY OF PRESENT ILLNESS:  Mr. Guttman is a 60 year old African  American man with a past medical history of congestive heart failure,  atrial fibrillation with RVR, diabetes, and medication noncompliance.  The patient had an admission in [place], [place] approximately 3 weeks  ago for congestive heart failure and atrial fibrillation with rapid  ventricular response.  He states he had been doing well initially.  However, when he relocated to the [place] area approximately 5 days  ago, he left all of his medications with his son.  His son has just  brought those medications today.  He does state that he thinks he took  his simvastatin and Lasix for the past 2 days because he had a few of  those on hand but did not have any others.  The patient states his left  arm discomfort has been intermittent and not associated with any  activity.  He denies any chest pain.  He does complain of shortness of  breath and peripheral edema, but states his edema is not as severe as  that prompting his hospitalization at the end of July.  He denies  palpitations, syncope, or near-syncope.  He does complain of orthopnea  and PND that are stable and chronic.   REVIEW OF SYSTEMS:  All 14 systems reviewed in detail and are negative  except as described in the HPI.   PAST MEDICAL HISTORY:  1. Type 2 diabetes on insulin.  2. Congestive heart failure by history, unknown EF.  3. Atrial fibrillation with rapid ventricular  response.  The patient      states that he was stopped on Coumadin due to inability to keep      appointments to follow INR.  4. Hypertension.  5. Hyperlipidemia.  6. Morbid obesity.  7. Medication noncompliance.   SOCIAL HISTORY:  Denies tobacco, alcohol, or drugs.  Recently was  relocated to the [place] area.   MEDICATIONS:  1. Lasix 80 mg a day.  2. Simvastatin 80 mg nightly.  3. Lisinopril 10 mg daily.  4. Diovan 80 mg daily.  5. Kerlix 50 mg b.i.d.  6. Klor-Con daily.  7. Lantus insulin 30 units nightly.  8. NovoLog sliding scale.  The patient states he rarely takes this.   ALLERGIES:  No known drug allergies.   PHYSICAL EXAM:  VITAL SIGNS:  Temperature 97.9, blood pressure 141/122,  pulse 81, respiratory rate 22, O2 saturation 92% on room air.  GENERAL:  A chronically ill-appearing disheveled morbidly obese African  American male in mild respiratory distress.  HEENT:  Oropharynx clear.  Mucous  membranes are moist.  NECK:  Unable to assess JVP due to redundant neck tissue.  CARDIOVASCULAR:  Irregularly irregular rhythm.  No murmur, gallop, or  rub.  LUNGS:  Poor air entry bilaterally.  Faint rales in the bilateral bases.  ABDOMEN:  Obese, distended, nontender.  Bowel sounds normoactive x4.  EXTREMITIES:  Skin changes consistent with chronic venous stasis.  3+  pitting peripheral edema bilaterally.  NEURO:  No gross deficits.  SKIN:  Warm and dry.  Chronic venous stasis changes as above.  PULSES:  2+ DP pulses bilaterally.   LABORATORY STUDIES:  BNP 299, troponin 0.08, CK-MB and CPK normal.  BMP  sodium 138, potassium 2.9, chloride 96, bicarb 33, glucose 89, BUN 14,  creatinine 1.18.  Calcium 8.7.  INR 1.2.  CBC with differential is  within normal limits.  EKG shows atrial fibrillation with a rate of 110.   IMPRESSION AND PLAN:  1. Atrial fibrillation with rapid ventricular response.  Mr. Leyda      states that he has been out of his medicines for at least the  past      5 days due to his moving and leaving his medicines at his previous      address.  His son has just delivered these.  However, he has not      taken any at all.  His rate has been easily controlled with a      Diltiazem bolus at 20 mg and now a drip at 5 mg an hour with a      heart rate in the 80s to 90s.  We will restart his home dose of the      Coreg 25 mg p.o. b.i.d.  The patient is on aspirin 325 mg daily.      He states he was discontinued from Coumadin due to his inability to      keep appointments to follow his INR.  We will therefore not start      full dose anticoagulation here.  However, if the patient shows that      he can consistently follow up in clinic as he wishes to establish a      primary care physician and cardiologist, we will readdress Coumadin      at that time.  Due to his comorbidities, he is at increased risk      for cerebrovascular accident.  We will discontinue the Diltiazem      drip once the patient's heart rate is normal on his home Coreg.  2. Congestive heart failure.  The patient was admitted approximately 3      weeks ago to a hospital in [place] with this.  The details of this      are unknown and the patient's ejection fraction is unknown.  His      BNP is mildly elevated at 299 and the patient does have total body      fluid overload, as evident by peripheral edema and faint bibasilar      rales.  He has been noncompliant with his Lasix, his ACE inhibitor,      and his ARB.  We will restart his lisinopril at 10 mg and his      Diovan at 80 mg.  We will give him Lasix 80 mg IV q.12 hours x2      doses and then restart his home maintenance dose at 80 mg daily.      Echocardiogram is pending for the morning.  3. Diabetes.  The patient states that he takes Lantus insulin 30 units      nightly and sliding scale NovoLog, though he rarely takes sliding      scale.  We will put him on Lantus 30 units nightly and put him on a      glycemic control  protocol with NovoLog 6 units with each meal and      sliding scale as needed.  The patient will be placed on a moderate      consistent carbohydrate diet and we will obtain diabetes education      while in house.  A hemoglobin A1c is pending.  4. Hyperlipidemia.  The patient will continue his simvastatin and      fasting lipid panel is pending for the morning.  5. Hypertension.  The patient's blood pressure is elevated most likely      due to him not taking his medicines for several days.  We will      resume his home medication regimen and titrate as needed for a goal      blood pressure      less than 130/80.  6. Morbid obesity.  The patient does express desire to lose weight.      We will obtain a dietician consult while in house, and if the      patient is willing to follow up as an outpatient, we will continue      on an outpatient basis.      Cherene Altes, MD  Electronically Signed     PS/MEDQ  D:  08/04/2009  T:  08/04/2009  Job:  [id number]

## 2011-05-04 NOTE — Cardiovascular Report (Signed)
NAMEEMARI, DEMMER            ACCOUNT NO.:  [id number]   MEDICAL RECORD NO.:  [id number]          PATIENT TYPE:  INP   LOCATION:  2007                         FACILITY:  [facility]   PHYSICIAN:  Bevelyn Buckles. Bensimhon, MDDATE OF BIRTH:  1950/12/23   DATE OF PROCEDURE:  08/06/2009  DATE OF DISCHARGE:                            CARDIAC CATHETERIZATION   PATIENT IDENTIFICATION:  Mr. Goshert is a 60 year old male with a history  of morbid obesity, diabetes, paroxysmal atrial fibrillation, and heart  failure.  He was admitted with chest pain and dyspnea.  He was found to  be in rapid AFib.  Echocardiogram showed a depressed ejection fraction  with mild to moderately reduced EF.  He is thus referred for cardiac  catheterization.   PROCEDURES PERFORMED:  1. Right heart catheterization.  2. Selective coronary angiography.  3. Left heart catheterization.  4. Left ventriculogram.  5. StarClose femoral artery closure.   DESCRIPTION OF PROCEDURE:  The risks and indication were explained to  him and his family.  Consent was signed and placed on the chart.  The  right groin area was prepped and draped in routine sterile fashion.  Given the size, we did have some difficulty cannulating femoral vessels.  We put a long 5-French arterial sheath in the right femoral artery using  a modified Seldinger technique.  Standard catheters including JL-5, JR-  4, and angled pigtail were used for the procedure.  All catheter  exchanges made over wire.  We used a 7-French venous sheath for the  right femoral vein and a standard Swan-Ganz catheter was used for right  heart cath.  There were no apparent complications.   At the end of the procedure, we attempted to close his right femoral  artery using a StarClose device.  We got an incomplete closure.  I held  manual pressure personally for nearly 15 minutes.  There was good  hemostasis.   Right atrial pressure mean of 24, RV pressure 63/22 with an EDP of 24,  pulmonary artery pressure 67/32 with a mean of 40, central aortic  pressure 139/109 with a mean of 128, LV pressure 155/28 with an EDP of  44.  Fick cardiac output 4.7 liters with a cardiac index of 1.8 liters  per minute per meter square.  Pulmonary vascular resistance 1.9 Woods  units.   Left main was a large-calibered vessel and was angiographically normal.   LAD was a large-calibered vessel, gave off a large first diagonal and a  small second diagonal.  There were minor luminal irregularities  throughout the proximal and midsection.  In the distal LAD, there was  evidence of diabetic vasculopathy with significant distal tapering of  about 50%.   Left circumflex gave off a large OM-1 and a small OM-2.  There was a 30%  lesion in the mid left circumflex.  In the OM-1 and the distal left  circumflex, there was again diabetic vasculopathy with distal tapering  of about 50-60%.   Right coronary artery was a large dominant vessel gave off a very large  branching acute marginal branch and a moderate-sized PDA.  There was  a  30% lesion in the midsection of the RCA and a 40% lesion in the large  acute marginal branch.   Left ventriculogram was done by hand injection and we were unable to  opacify the vessel well.  Thus, we could not estimate the ejection  fraction.   ASSESSMENT:  1. 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  2. Markedly elevated left ventricular filling pressures.  3. 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   PLAN:  Medical therapy of his coronary artery disease and aggressive  diuresis.      Bevelyn Buckles. Bensimhon, MD  Electronically Signed     DRB/MEDQ  D:  08/06/2009  T:  08/07/2009  Job:  [id number]

## 2011-05-04 NOTE — Discharge Summary (Signed)
[name], [name]            ACCOUNT NO.:  [id number]   MEDICAL RECORD NO.:  [id number]          PATIENT TYPE:  INP   LOCATION:  2007                         FACILITY:  [facility]   PHYSICIAN:  Beckey Rutter, MD  DATE OF BIRTH:  [birth year]   DATE OF ADMISSION:  08/04/2009  DATE OF DISCHARGE:  08/12/2009                               DISCHARGE SUMMARY   PRIMARY CARE PHYSICIAN:  Unassigned.   CHIEF COMPLAINT ON ADMISSION:  Left arm discomfort.   BRIEF HISTORY OF PRESENT ILLNESS:  A 60 year old African American male  who has had decreased arm discomfort, was found to have AFib with RVR,  diabetes, noncompliance.   HOSPITAL PROCEDURES:  1. Cardiac cath on August 06, 2009, by Dr. Gala Romney.  Impression is      nonobstructive coronary artery disease described as cardiac      vasculopathy.  2. Marked elevation in left ventricular filling pressure.  3. Pulmonary venous hypertension.  Normal vascular system suggesting      that he will normalize with pulmonary pressure with adequate      diuresis.  The suggested plan is medical therapy for his coronary      artery disease and aggressive diuresis.   His BNP today is 111, his sodium is 133, potassium 3.6, chloride 91,  bicarb 32, glucose 125, BUN is 32, creatinine is 1.6.  Last CBC showing  white blood cell count 2.3, hemoglobin 14.1, hematocrit  43.9, platelet  count is 224.   HOSPITAL COURSE BY PROBLEM:  1. The patient was found to have CHF with RVR as discussed above.  The      patient is known to have atrial fibrillation and he was a candidate      for chronic anticoagulation because he is noncompliance.  At this      time, the patient had a 2-D echo on August 05, 2009, and the      patient's ejection fraction appeared mild-to-moderately reduced.      The cavity size was normal.  Wall thickness was increased in      pattern of mild LDH.  The patient then was taken to the cardiac      cath lab on the 18th to rule out coronary  artery disease and      further evaluate the results as discussed above.  The patient was      kept in the hospital with diuresis, he was taking 80 mg Lasix 3      times.  The diuresis is minimal lately.  Zaroxolyn medicine was      added by cardiologist for further diuresis benefit, but his      creatinine has started to increase to 1.5, today is 1.6, so      Zaroxolyn was discontinued.  The plan now is to continue Lasix.      The patient should have his renal function tested by the end of the      week and ensure the patient does not have acute renal failure.  ID      to evaluate renal function.   HOSPITAL CONSULTATION:  Cardiology  kindly done by  Dr. Gala Romney with  [facility].   Obstructive sleep apnea.  The patient was diagnosed with obstructive  sleep apnea before and he was advised continuing CPAP in [place],  [place].  The patient nevertheless noncompliant and had a lengthy  discussion with him in this regard.  He does not require CPAP at this  time.   The patient would benefit from full pulmonary function test and sleep  study, but again he is noncompliant and at this time he is not  interested in doing it.  I had the patient monitored overnight his pulse  oximetry during sleep and he had dropped his oxygen saturation to 85%  around 3 o'clock in the morning while he was asleep.  The patient has  numbers as follows at 12 noon is 91% on room air and at 1:30 in the  morning the saturation is 93% on room air, 2:30 is 88% on room air, 3:30  is 85% on room air, 4 o'clock is 89% on room air, 4:30 is 92% with 2 L  of oxygen.  The patient was was ordered for CPAP but he was not wanting  to use it in the hospital so it was discontinued.   DISCHARGE MEDICATIONS:  1. Lasix 80 mg b.i.d.  2. Potassium chloride 20 mEq b.i.d.  3. Coreg 50 mg b.i.d.  4. Norvasc 5 mg daily.  5. Aspirin 81 mg p.o. daily.  6. Digoxin 0.25 daily.  7. Zocor 40 mg daily.  8. Lisinopril 30 mg  daily.   The patient is stable for discharge today.  The patient was residing in  a hotel currently. He will be discharged to the hotel with home health  PT.  The patient is aware of the need to followup of his renal function.  There is home health PT and home care to follow up with his physician  and the home care should followup with CHF program on August 15, 2009 as  scheduled.     Time spent more than 40 minutes.      Beckey Rutter, MD  Electronically Signed     EME/MEDQ  D:  08/12/2009  T:  08/13/2009  Job:  [id number]

## 2011-05-04 NOTE — Consult Note (Signed)
NAMEROLDAN, LAFOREST            ACCOUNT NO.:  [id number]   MEDICAL RECORD NO.:  [id number]          PATIENT TYPE:  INP   LOCATION:  2007                         FACILITY:  [facility]   PHYSICIAN:  Bevelyn Buckles. Bensimhon, MDDATE OF BIRTH:  1951/06/01   DATE OF CONSULTATION:  DATE OF DISCHARGE:                                 CONSULTATION   PRIMARY CARDIOLOGIST:  New to [facility], being seen by Dr.  Gala Romney.   PRIMARY CARE PHYSICIAN:  Salil J. Allena Katz, MD, in [place], [place].   PATIENT PROFILE:  A 60 year old obese African American male with prior  history of atrial fibrillation and CHF who presents with left arm and  chest discomfort as well as atrial fibrillation with rapid ventricular  response.   PROBLEM LIST:  1. History of CHF.  2. History of atrial fibrillation.      a.     The patient has never been cardioverted.      b.     Previously maintained on Coumadin which was discontinued       secondary to noncompliance with Coumadin followup or INR followup.  3. Hypertension.  4. Hyperlipidemia.  5. Diabetes mellitus.  6. History of noncompliance.  7. Morbid obesity.   HISTORY OF PRESENT ILLNESS:  A 60 year old African American male with  the above problem list.  His cardiac history dates back approximately 2-  3 years ago when he was diagnosed with heart failure and also atrial  fibrillation.  He has been followed by Dr. Allena Katz in [place],  [place] since that time.  He says he has never undergone cardiac  catheterization and also has never been cardioverted.  He is not sure  whether or not he stays in atrial fibrillation or has come out of it at  some point in the past.  He was recently admitted to a hospital in  [place], [place] in July of this year for CHF.  He recently moved to  [facility] and 2-3 days ago he began having constant left upper arm  throbbing and intermittent chest pain associated with mild shortness of  breath.  He denies tachy  palpitations or presyncope.  As symptoms  persisted, he presented to the [facility] ED on August 04, 2009, and was  found to be in atrial fibrillation at a rate of 110.  He was started on  IV diltiazem with improved rate control and even bradycardia with rates  in the 30s causing the diltiazem to be discontinued.  Currently, his  heart rates in 80s to 100s range.  He denies any arm or chest pain.  His  troponin I is mildly elevated at 0.08.   ALLERGIES:  No known drug allergies.   CURRENT MEDICATIONS:  1. Aspirin 325 mg daily.  2. Coreg 50 mg b.i.d.  3. Lasix 80 mg daily.  4. Sliding scale insulin Lantus 30 units nightly.  5. Lisinopril 20 mg daily.  6. Benicar 10 mg daily.   FAMILY HISTORY:  His mother died in her 52s or 64s, he is not sure of  what.  Father died of unknown cause at unknown age.  Brother  died at  unknown age of unknown cause.   SOCIAL HISTORY:  He lives in [place] by himself, currently living in  a hotel until Social Security places him in an apartment.  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  He denies tobacco, alcohol, or drug use.  He is  not routinely exercising.   REVIEW OF SYSTEMS:  Positive chest and arm pain with shortness of  breath.  He also has chronic dyspnea on exertion after walking  approximately 50 yards and has occurred over the past 2-3 years.  He  does experience lower extremity edema which he says has improved.  He is  diabetic.  Otherwise, all systems reviewed and negative.  He is a full  code.   PHYSICAL EXAMINATION:  VITAL SIGNS:  Temperature 97.7, heart rate 103,  respirations 20, blood pressure 141/105, and pulse ox 94%.  GENERAL:  A pleasant African American male, in no acute distress, awake,  alert, and oriented x3.  HEENT:  Normal.  NEURO:  Grossly intact and nonfocal.  SKIN:  Warm and dry with chronic venous stasis changes of bilateral  lower extremities.  MUSCULOSKELETAL:  Grossly normal without deformity or effusion.  NECK:   Obese and supple.  There are no bruits.  Difficult to assess JVP  secondary to obesity.  LUNGS:  Respirations regular and unlabored, diminished breath sounds  bilaterally.  Otherwise, clear to auscultation.  CARDIAC:  Irregularly irregular, distant S1 and S2.  No S3 or S4  murmurs.  ABDOMEN:  Obese, soft, nontender, and nondistended.  Bowel sounds  present x4.  EXTREMITIES:  Warm and dry.  No clubbing or cyanosis.  He has trace  bilateral lower extremity edema in his ankles.  Dorsalis pedis and  posterior tibial pulses 1+ and equal bilaterally.   His chest x-ray showed moderate cardiomegaly with suboptimal inspiration  and mild basilar atelectasis.  No definite active process.  EKG shows  atrial fibrillation at a rate of 110 and poor R-wave progression.   LAB WORK:  Hemoglobin 13.0, hematocrit 39.9, WBC 7.2, and platelets 335.  Sodium 141, potassium 4.2, chloride 100, CO2 of 36, BUN 14, creatinine  1.14, and glucose 148.  BNP 299.  CK 143, MB 2.0, and troponin I of  0.08.  Calcium 8.9.  Total cholesterol 118, triglycerides 81, HDL 20,  and LDL 82.   ASSESSMENT/PLAN:  1. Atrial fibrillation, question duration:  The patient does not feel      palpitations and it is possible that he is always in atrial      fibrillation which has previously been rate controlled.  We need      outside records to confirm.  Continue high-dose beta-blocker and      add heparin and digoxin for improved rate control.  Check thyroid      function.  Echocardiogram performed today at the bedside      preliminary shows an EF of 35-40%.  He has CHADS score up to 3.      Unfortunately, he is a poor Coumadin candidate secondary to history      of skipping followup.  At this point, we would prefer to continue      rate control and to that end we will add digoxin therapy.  2. Chest and left arm pain:  This is now resolved.  He has typical and      atypical features.  In the setting of mildly elevated troponin with       reduced  EF as well as multiple risk factors, we would plan right      and left heart cardiac catheterization if the patient is willing.      He wants to think about this.  We will add heparin for now.      Continue beta-blocker and ACE inhibitor.  We will add back statin      therapy.  His LDL was 82.  3. Nonsustained ventricular tachycardia:  The patient had a 9-beat run      of asymptomatic nonsustained ventricular tachycardia earlier this      morning.  Continue beta-blocker therapy.  Again, recommend cath to      evaluate for possibility of obstructive coronary artery disease      leading to ischemia.  4. Hypertension:  Blood pressure remains elevated.  Notes that he is      on both low-dose ACE and ARB.  To consolidate this and try and      improve compliance, we will discontinue his ARB in favor of the      generic ACE inhibitor.  We will increase his lisinopril to 40 mg      daily.  If pressure remains elevated, we would plan to add      amlodipine next.  5. Hyperlipidemia:  LDL of 82.  He was previously taking simvastatin      80 mg.  We will add Crestor while he is in-house.  6. Diabetes mellitus:  Per primary team.  7. Morbid obesity:  The patient has been seen by Diet Education and he      will need continued education reinforcement.      Nicolasa Ducking, ANP      Bevelyn Buckles. Bensimhon, MD  Electronically Signed    CB/MEDQ  D:  08/05/2009  T:  08/06/2009  Job:  [id number]

## 2011-08-26 ENCOUNTER — Other Ambulatory Visit: Payer: Self-pay | Admitting: *Deleted

## 2011-08-26 MED ORDER — DABIGATRAN ETEXILATE MESYLATE 150 MG PO CAPS: 150.0000 mg | ORAL_CAPSULE | Freq: Two times a day (BID) | ORAL | Status: DC

## 2011-11-22 ENCOUNTER — Emergency Department (HOSPITAL_BASED_OUTPATIENT_CLINIC_OR_DEPARTMENT_OTHER)
Admission: EM | Admit: 2011-11-22 | Discharge: 2011-11-22 | Disposition: A | Discharge status: Home / Self Care | Payer: Medicaid Other | Attending: Emergency Medicine | Admitting: Emergency Medicine

## 2011-11-22 ENCOUNTER — Emergency Department (INDEPENDENT_AMBULATORY_CARE_PROVIDER_SITE_OTHER): Payer: Medicaid Other

## 2011-11-22 ENCOUNTER — Other Ambulatory Visit: Payer: Self-pay

## 2011-11-22 ENCOUNTER — Encounter (HOSPITAL_BASED_OUTPATIENT_CLINIC_OR_DEPARTMENT_OTHER): Payer: Self-pay | Admitting: *Deleted

## 2011-11-22 DIAGNOSIS — Z79899 Other long term (current) drug therapy: Secondary | ICD-10-CM | POA: Insufficient documentation

## 2011-11-22 DIAGNOSIS — I10 Essential (primary) hypertension: Secondary | ICD-10-CM

## 2011-11-22 DIAGNOSIS — G4730 Sleep apnea, unspecified: Secondary | ICD-10-CM | POA: Insufficient documentation

## 2011-11-22 DIAGNOSIS — E785 Hyperlipidemia, unspecified: Secondary | ICD-10-CM | POA: Insufficient documentation

## 2011-11-22 DIAGNOSIS — M7989 Other specified soft tissue disorders: Secondary | ICD-10-CM | POA: Insufficient documentation

## 2011-11-22 DIAGNOSIS — I2510 Atherosclerotic heart disease of native coronary artery without angina pectoris: Secondary | ICD-10-CM | POA: Insufficient documentation

## 2011-11-22 DIAGNOSIS — R05 Cough: Secondary | ICD-10-CM

## 2011-11-22 DIAGNOSIS — I509 Heart failure, unspecified: Secondary | ICD-10-CM

## 2011-11-22 DIAGNOSIS — R0989 Other specified symptoms and signs involving the circulatory and respiratory systems: Secondary | ICD-10-CM

## 2011-11-22 DIAGNOSIS — J40 Bronchitis, not specified as acute or chronic: Secondary | ICD-10-CM | POA: Insufficient documentation

## 2011-11-22 DIAGNOSIS — R059 Cough, unspecified: Secondary | ICD-10-CM | POA: Insufficient documentation

## 2011-11-22 DIAGNOSIS — I517 Cardiomegaly: Secondary | ICD-10-CM

## 2011-11-22 LAB — DIFFERENTIAL
Basophils Absolute: 0 10*3/uL (ref 0.0–0.1)
Basophils Relative: 0 % (ref 0–1)
Eosinophils Relative: 1 % (ref 0–5)
Lymphocytes Relative: 20 % (ref 12–46)
Monocytes Relative: 12 % (ref 3–12)
Neutro Abs: 5.2 10*3/uL (ref 1.7–7.7)

## 2011-11-22 LAB — CARDIAC PANEL(CRET KIN+CKTOT+MB+TROPI)
CK, MB: 1.9 ng/mL (ref 0.3–4.0)
Total CK: 48 U/L (ref 7–232)

## 2011-11-22 LAB — BASIC METABOLIC PANEL
BUN: 14 mg/dL (ref 6–23)
CO2: 31 mEq/L (ref 19–32)
Chloride: 101 mEq/L (ref 96–112)
GFR calc Af Amer: >90 mL/min (ref >90)
Glucose, Bld: 201 mg/dL — ABNORMAL HIGH (ref 70–99)
Potassium: 4.1 mEq/L (ref 3.5–5.1)

## 2011-11-22 LAB — CBC
HCT: 35.7 % — ABNORMAL LOW (ref 39.0–52.0)
Hemoglobin: 11.7 g/dL — ABNORMAL LOW (ref 13.0–17.0)
WBC: 7.7 10*3/uL (ref 4.0–10.5)

## 2011-11-22 LAB — GLUCOSE, CAPILLARY: Glucose-Capillary: 347 mg/dL — ABNORMAL HIGH (ref 70–99)

## 2011-11-22 LAB — PRO B NATRIURETIC PEPTIDE: Pro B Natriuretic peptide (BNP): 1999 pg/mL — ABNORMAL HIGH (ref 0–125)

## 2011-11-22 MED ORDER — DOXYCYCLINE HYCLATE 100 MG PO TABS
100.0000 mg | ORAL_TABLET | Freq: Once | ORAL | Status: AC
  Administered 2011-11-22: 100 mg via ORAL
  Filled 2011-11-22: qty 1

## 2011-11-22 MED ORDER — DOXYCYCLINE HYCLATE 100 MG PO CAPS: 100.0000 mg | ORAL_CAPSULE | Freq: Two times a day (BID) | ORAL | Status: AC

## 2011-11-22 MED ORDER — ALBUTEROL SULFATE HFA 108 (90 BASE) MCG/ACT IN AERS
2.0000 | INHALATION_SPRAY | Freq: Once | RESPIRATORY_TRACT | Status: AC
  Administered 2011-11-22: 2 via RESPIRATORY_TRACT
  Filled 2011-11-22: qty 6.7

## 2011-11-22 MED ORDER — ALBUTEROL SULFATE HFA 108 (90 BASE) MCG/ACT IN AERS: 1.0000 | INHALATION_SPRAY | Freq: Four times a day (QID) | RESPIRATORY_TRACT | Status: DC | PRN

## 2011-11-22 NOTE — ED Notes (Signed)
BS 347

## 2011-11-22 NOTE — ED Notes (Signed)
Pt c/o cough, fever, and body aches x 3 weeks

## 2011-11-22 NOTE — ED Provider Notes (Signed)
History  This chart was scribed for Dustin Octave, MD by Bennett Scrape. This patient was seen in room MH09/MH09 and the patient's care was started at 5:18PM.  CSN: [id number] Arrival date & time: 11/22/2011  4:54 PM   First MD Initiated Contact with Patient 11/22/11 1715      Chief Complaint  Patient presents with  . Cough  . Fever     The history is provided by the patient. No language interpreter was used.   [name] is a 60 y.o. male who presents to the Emergency Department complaining of 3 weeks of gradual onset, gradually improving productive cough described as white sputum with associated chest pain while coughing. Pt states that he took robitussin with no improvement in his symptoms. Pt states that had fevers and myalgias, but that they have resolved over the past 7 days. Temperature was measured at 99.2 in the ED. Pt denies nausea, vomiting, diarrhea, SOB, and abdominal pain as associated symptoms.Pt also c/o left leg swelling but with no pain. Pt states that at baseline both of his lower legs are swollen, but that the left leg has become more swollen than usual over the past 3 days.  Pt has a h/o diabetes. Pt states that he normally takes his diabetic medication as prescribed but forgot to take it last night. Pt states that he took his normal dose this morning.   Past Medical History  Diagnosis Date  . 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 He had markedly elevated LV filling pressures and pulmonary venous hypertension with normal pulmonary vascular resistance suggesting he will normalize his pulmonary pressures with adequate diuresis.   . Renal insufficiency   . Sleep apnea   . Morbid obesity   . A-fib   . Chronic diastolic heart failure     Echo 8.25.2011:  Mod LVH; EF 50%; Mod LAE; RV dilation and ? dysfxn; mild RAE;   . Other primary cardiomyopathies     . Dyslipidemia   . Essential hypertension, benign   . Diabetes mellitus without mention of complication     History reviewed. No pertinent past surgical history.  Family History  Problem Relation Age of Onset  . Hypertension      Aunts    History  Substance Use Topics  . Smoking status: Never Smoker   . Smokeless tobacco: Not on file  . Alcohol Use: No      Review of Systems A complete 10 system review of systems was obtained and is otherwise negative except as noted in the HPI.   Allergies  Review of patient's allergies indicates no known allergies.  Home Medications   Current Outpatient Rx  Name Route Sig Dispense Refill  . AMLODIPINE BESYLATE 5 MG PO TABS Oral Take 5 mg by mouth daily.      Marland Kitchen CARVEDILOL 25 MG PO TABS Oral Take 50 mg by mouth 2 (two) times daily with a meal.     . DABIGATRAN ETEXILATE MESYLATE 150 MG PO CAPS Oral Take 1 capsule (150 mg total) by mouth every 12 (twelve) hours. 60 capsule 3  . DICLOFENAC SODIUM 75 MG PO TBEC Oral Take 75 mg by mouth daily.     Marland Kitchen DIGOXIN 0.25 MG PO TABS Oral Take 250 mcg by mouth daily.      . FUROSEMIDE 80 MG PO TABS Oral Take 80 mg by mouth 2 (two) times daily.      Marland Kitchen  GUAIFENESIN 100 MG/5ML PO SYRP Oral Take 200 mg by mouth 3 (three) times daily as needed. For congestion     . GUAIFENESIN 100 MG/5ML PO SYRP Oral Take 200 mg by mouth 3 (three) times daily as needed. For congestion     . INSULIN ASPART 100 UNIT/ML [place] SOLN Subcutaneous Inject 3-10 Units into the skin 3 (three) times daily before meals. On Sliding Scale     . INSULIN GLARGINE 100 UNIT/ML [place] SOLN Subcutaneous Inject 4 Units into the skin at bedtime. As directed    . LOSARTAN POTASSIUM 100 MG PO TABS Oral Take 100 mg by mouth daily.      Marland Kitchen ONE-DAILY MULTI VITAMINS PO TABS Oral Take 1 tablet by mouth daily.      Marland Kitchen POTASSIUM CHLORIDE CRYS CR 20 MEQ PO TBCR Oral Take 20 mEq by mouth 3 (three) times daily.     Marland Kitchen PRAVASTATIN SODIUM 80 MG PO TABS Oral Take 80 mg  by mouth daily.      . ALBUTEROL SULFATE HFA 108 (90 BASE) MCG/ACT IN AERS Inhalation Inhale 1-2 puffs into the lungs every 6 (six) hours as needed for wheezing. 1 Inhaler 0  . DOXYCYCLINE HYCLATE 100 MG PO CAPS Oral Take 1 capsule (100 mg total) by mouth 2 (two) times daily. 20 capsule 0  . INSULIN ASPART PROT & ASPART (70-30) 100 UNIT/ML [place] SUSP Subcutaneous Inject into the skin. As directed      Triage Vitals: BP 129/86  Pulse 101  Temp(Src) 99.2 F (37.3 C) (Oral)  Resp 16  Ht 5\' 3"  (1.6 m)  Wt 364 lb (165.109 kg)  BMI 64.48 kg/m2  SpO2 99%  Physical Exam  Nursing note and vitals reviewed. Constitutional: He is oriented to person, place, and time. He appears well-developed and well-nourished.       Pt is obese  HENT:  Head: Normocephalic and atraumatic.  Eyes: Conjunctivae and EOM are normal. Pupils are equal, round, and reactive to light.  Neck: Normal range of motion. Neck supple.  Cardiovascular: Normal rate, regular rhythm and normal heart sounds.        Good distal pulses.  Pulmonary/Chest: Effort normal and breath sounds normal.  Abdominal: Soft. Bowel sounds are normal. There is no tenderness.  Musculoskeletal: Normal range of motion. He exhibits edema (swollen legs at baseline, left leg is more swollen than usual).  Neurological: He is alert and oriented to person, place, and time.  Skin: Skin is warm and dry.    ED Course  Procedures (including critical care time)  DIAGNOSTIC STUDIES: Oxygen Saturation is 99% on room air, normal by my interpretation.    COORDINATION OF CARE: 5:20PM-Discussed treatment plan with pt at bedside and pt agreed to plan. 8:29PM-Pt states that he feels better and is comfortable being discharged home. Will be sent home with inhaler.    Labs Reviewed  GLUCOSE, CAPILLARY - Abnormal; Notable for the following:    Glucose-Capillary 347 (*)    All other components within normal limits  CBC - Abnormal; Notable for the following:     Hemoglobin 11.7 (*)    HCT 35.7 (*)    All other components within normal limits  BASIC METABOLIC PANEL - Abnormal; Notable for the following:    Glucose, Bld 201 (*)    All other components within normal limits  PROTIME-INR - Abnormal; Notable for the following:    Prothrombin Time 16.3 (*)    All other components within normal limits  PRO B  NATRIURETIC PEPTIDE - Abnormal; Notable for the following:    BNP, POC 1999.0 (*)    All other components within normal limits  DIFFERENTIAL  CARDIAC PANEL(CRET KIN+CKTOT+MB+TROPI)  D-DIMER, QUANTITATIVE  POCT CBG MONITORING  PATHOLOGIST SMEAR REVIEW   Dg Chest 2 View  11/22/2011  *RADIOLOGY REPORT*  Clinical Data: Cough.  Congestion.  History of congestive heart failure.  CHEST - 2 VIEW  Comparison: 08/04/2009  Findings: Cardiac silhouette is enlarged.  Mediastinal shadows are otherwise unremarkable.  There is venous hypertension possibly with mild interstitial edema.  No effusions.  No consolidation or collapse.  No significant bony finding.  IMPRESSION: Cardiomegaly and pulmonary venous hypertension.  Possible mild interstitial edema.  Original Report Authenticated By: Thomasenia Sales, M.D.   US Venous Img Lower Bilateral  11/22/2011  *RADIOLOGY REPORT*  Clinical Data: Bilateral leg swelling with cough and congestion. History of congestive heart failure, hypertension, diabetes and obesity.  BILATERAL LOWER EXTREMITY VENOUS DUPLEX ULTRASOUND  Technique:  Gray-scale sonography with graded compression, as well as color Doppler and duplex ultrasound, were performed to evaluate the deep venous system of both lower extremities from the level of the common femoral vein through the popliteal and proximal calf veins.  Spectral Doppler was utilized to evaluate flow at rest and with distal augmentation maneuvers.  Comparison:  None.  Findings:  Study is limited by body habitus.  The calf veins are suboptimally evaluated.  Normal compressibility of bilateral common  femoral, superficial femoral, and popliteal veins is demonstrated, as well as the visualized proximal calf veins.  No filling defects to suggest DVT on grayscale or color Doppler imaging.  Doppler waveforms show normal direction of venous flow, normal respiratory phasicity and response to augmentation.  IMPRESSION: No evidence of deep vein thrombosis in either lower extremity.  The examination is limited by body habitus.  Original Report Authenticated By: Gerrianne Scale, M.D.     1. Bronchitis   2. Cough       MDM  Dry nonproductive cough for the past 3 weeks. Patient does admit to slight chest pain with coughing. He reports increase in his chronic left lower showing swelling.  Contrary to triage note he denies fever or body aches.  He does have a history of diastolic heart failure, nonobstructive coronary disease, morbid obesity, pulmonary hypertension. We'll obtain chest x-ray.  He is anticoagulated on pradaxa for Afib which should also lower his risk VTE.  Dopplers negative for DVTs.  Lungs clear.  Able to ambulate in ED without desaturation.   Mild interstitial edema on Xray.     Date: 11/22/2011  Rate: 90  Rhythm: atrial fibrillation  QRS Axis: normal  Intervals: normal  ST/T Wave abnormalities: nonspecific ST/T changes  Conduction Disutrbances:none  Narrative Interpretation:   Old EKG Reviewed: none available     I personally performed the services described in this documentation, which was scribed in my presence.  The recorded information has been reviewed and considered.    Dustin Octave, MD 11/23/11 [place]

## 2011-12-22 ENCOUNTER — Emergency Department (HOSPITAL_BASED_OUTPATIENT_CLINIC_OR_DEPARTMENT_OTHER)
Admission: EM | Admit: 2011-12-22 | Discharge: 2011-12-22 | Disposition: A | Discharge status: Home / Self Care | Payer: Medicaid Other | Attending: Emergency Medicine | Admitting: Emergency Medicine

## 2011-12-22 ENCOUNTER — Encounter (HOSPITAL_BASED_OUTPATIENT_CLINIC_OR_DEPARTMENT_OTHER): Payer: Self-pay | Admitting: *Deleted

## 2011-12-22 ENCOUNTER — Emergency Department (INDEPENDENT_AMBULATORY_CARE_PROVIDER_SITE_OTHER): Payer: Medicaid Other

## 2011-12-22 DIAGNOSIS — I517 Cardiomegaly: Secondary | ICD-10-CM

## 2011-12-22 DIAGNOSIS — G4730 Sleep apnea, unspecified: Secondary | ICD-10-CM | POA: Insufficient documentation

## 2011-12-22 DIAGNOSIS — E785 Hyperlipidemia, unspecified: Secondary | ICD-10-CM | POA: Insufficient documentation

## 2011-12-22 DIAGNOSIS — I2510 Atherosclerotic heart disease of native coronary artery without angina pectoris: Secondary | ICD-10-CM | POA: Insufficient documentation

## 2011-12-22 DIAGNOSIS — J811 Chronic pulmonary edema: Secondary | ICD-10-CM

## 2011-12-22 DIAGNOSIS — R079 Chest pain, unspecified: Secondary | ICD-10-CM

## 2011-12-22 DIAGNOSIS — M79609 Pain in unspecified limb: Secondary | ICD-10-CM | POA: Insufficient documentation

## 2011-12-22 DIAGNOSIS — I10 Essential (primary) hypertension: Secondary | ICD-10-CM | POA: Insufficient documentation

## 2011-12-22 DIAGNOSIS — Z79899 Other long term (current) drug therapy: Secondary | ICD-10-CM | POA: Insufficient documentation

## 2011-12-22 DIAGNOSIS — E119 Type 2 diabetes mellitus without complications: Secondary | ICD-10-CM | POA: Insufficient documentation

## 2011-12-22 DIAGNOSIS — M79606 Pain in leg, unspecified: Secondary | ICD-10-CM

## 2011-12-22 LAB — BASIC METABOLIC PANEL
BUN: 17 mg/dL (ref 6–23)
Calcium: 9.3 mg/dL (ref 8.4–10.5)
Chloride: 103 mEq/L (ref 96–112)
Creatinine, Ser: 0.9 mg/dL (ref 0.50–1.35)
GFR calc Af Amer: >90 mL/min (ref >90)
GFR calc non Af Amer: >90 mL/min (ref >90)

## 2011-12-22 MED ORDER — HYDROCODONE-ACETAMINOPHEN 5-500 MG PO TABS: 1.0000 | ORAL_TABLET | Freq: Four times a day (QID) | ORAL | Status: AC | PRN

## 2011-12-22 NOTE — ED Provider Notes (Signed)
Medical screening examination/treatment/procedure(s) were performed by non-physician practitioner and as supervising physician I was immediately available for consultation/collaboration.   Kailah Pennel, MD 12/22/11 2306 

## 2011-12-22 NOTE — Discharge Instructions (Signed)
Pain of Unknown Etiology (Pain Without a Known Cause) You have come to your caregiver because of pain. Pain can occur in any part of the body. Often there is not a definite cause. If your laboratory (blood or urine) work was normal and x-rays or other studies were normal, your caregiver may treat you without knowing the cause of the pain. An example of this is the headache. Most headaches are diagnosed by taking a history. This means your caregiver asks you questions about your headaches. Your caregiver determines a treatment based on your answers. Usually testing done for headaches is normal. Often testing is not done unless there is no response to medications. Regardless of where your pain is located today, you can be given medications to make you comfortable. If no physical cause of pain can be found, most cases of pain will gradually leave as suddenly as they came.  If you have a painful condition and no reason can be found for the pain, It is importantthat you follow up with your caregiver. If the pain becomes worse or does not go away, it may be necessary to repeat tests and look further for a possible cause.  Only take over-the-counter or prescription medicines for pain, discomfort, or fever as directed by your caregiver.   For the protection of your privacy, test results can not be given over the phone. Make sure you receive the results of your test. Ask as to how these results are to be obtained if you have not been informed. It is your responsibility to obtain your test results.   You may continue all activities unless the activities cause more pain. When the pain lessens, it is important to gradually resume normal activities. Resume activities by beginning slowly and gradually increasing the intensity and duration of the activities or exercise. During periods of severe pain, bed-rest may be helpful. Lay or sit in any position that is comfortable.   Ice used for acute (sudden) conditions may be  effective. Use a large plastic bag filled with ice and wrapped in a towel. This may provide pain relief.   See your caregiver for continued problems. They can help or refer you for exercises or physical therapy if necessary.  If you were given medications for your condition, do not drive, operate machinery or power tools, or sign legal documents for 24 hours. Do not drink alcohol, take sleeping pills, or take other medications that may interfere with treatment. See your caregiver immediately if you have pain that is becoming worse and not relieved by medications. Document Released: 08/31/2001 Document Revised: 08/18/2011 Document Reviewed: 12/06/2005 [employer] Patient Information 2012 [employer]. 

## 2011-12-22 NOTE — ED Provider Notes (Signed)
History     CSN: [id number]  Arrival date & time 12/22/11  1657   First MD Initiated Contact with Patient 12/22/11 1754      Chief Complaint  Patient presents with  . Leg Pain    (Consider location/radiation/quality/duration/timing/severity/associated sxs/prior treatment) HPI Comments: Pt states that the pain has been going on for 2 weeks, but he decided to come in today to make sure nothing serious is going on:pt states that he has not had any swelling redness or injury to the area:pt denies cp or sob  Patient is a 61 y.o. male presenting with leg pain. The history is provided by the patient. No language interpreter was used.  Leg Pain  The incident occurred more than 1 week ago. The incident occurred at home. There was no injury mechanism. The pain location is generalized. The quality of the pain is described as aching. The pain is mild. The pain has been constant since onset. Pertinent negatives include no numbness, no inability to bear weight, no loss of motion, no muscle weakness, no loss of sensation and no tingling. He reports no foreign bodies present. The symptoms are aggravated by activity. He has tried nothing for the symptoms.    Past Medical History  Diagnosis Date  . 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 He had markedly elevated LV filling pressures and pulmonary venous hypertension with normal pulmonary vascular resistance suggesting he will normalize his pulmonary pressures with adequate diuresis.   . Renal insufficiency   . Sleep apnea   . Morbid obesity   . A-fib   . Chronic diastolic heart failure     Echo 8.25.2011:  Mod LVH; EF 50%; Mod LAE; RV dilation and ? dysfxn; mild RAE;   . Other primary cardiomyopathies   . Dyslipidemia   . Essential hypertension, benign   . Diabetes mellitus without mention of complication     History reviewed. No pertinent  past surgical history.  Family History  Problem Relation Age of Onset  . Hypertension      Aunts    History  Substance Use Topics  . Smoking status: Never Smoker   . Smokeless tobacco: Not on file  . Alcohol Use: No      Review of Systems  Neurological: Negative for tingling and numbness.  All other systems reviewed and are negative.    Allergies  Review of patient's allergies indicates no known allergies.  Home Medications   Current Outpatient Rx  Name Route Sig Dispense Refill  . ALBUTEROL SULFATE HFA 108 (90 BASE) MCG/ACT IN AERS Inhalation Inhale 1-2 puffs into the lungs every 6 (six) hours as needed for wheezing. 1 Inhaler 0  . AMLODIPINE BESYLATE 5 MG PO TABS Oral Take 5 mg by mouth daily.      Marland Kitchen CARVEDILOL 25 MG PO TABS Oral Take 50 mg by mouth 2 (two) times daily with a meal.     . DABIGATRAN ETEXILATE MESYLATE 150 MG PO CAPS Oral Take 1 capsule (150 mg total) by mouth every 12 (twelve) hours. 60 capsule 3  . DICLOFENAC SODIUM 75 MG PO TBEC Oral Take 75 mg by mouth daily.     Marland Kitchen DIGOXIN 0.25 MG PO TABS Oral Take 250 mcg by mouth daily.      . FUROSEMIDE 80 MG PO TABS Oral Take 80 mg by mouth 2 (two) times daily.      . INSULIN ASPART  100 UNIT/ML [place] SOLN Subcutaneous Inject 3-10 Units into the skin 3 (three) times daily before meals. On Sliding Scale     . INSULIN GLARGINE 100 UNIT/ML [place] SOLN Subcutaneous Inject 40 Units into the skin at bedtime. As directed    . LOSARTAN POTASSIUM 100 MG PO TABS Oral Take 100 mg by mouth daily.      Marland Kitchen ONE-DAILY MULTI VITAMINS PO TABS Oral Take 1 tablet by mouth daily.      Marland Kitchen POTASSIUM CHLORIDE CRYS ER 20 MEQ PO TBCR Oral Take 20 mEq by mouth 3 (three) times daily.     Marland Kitchen PRAVASTATIN SODIUM 80 MG PO TABS Oral Take 80 mg by mouth daily.      Marland Kitchen HYDROCODONE-ACETAMINOPHEN 5-500 MG PO TABS Oral Take 1-2 tablets by mouth every 6 (six) hours as needed for pain. 6 tablet 0  . INSULIN ASPART PROT & ASPART (70-30) 100 UNIT/ML [place] SUSP  Subcutaneous Inject into the skin. As directed      BP 155/74  Pulse 64  Temp(Src) 97.8 F (36.6 C) (Oral)  Resp 22  Wt 353 lb 3 oz (160.205 kg)  SpO2 96%  Physical Exam  Nursing note and vitals reviewed. Constitutional: He is oriented to person, place, and time. He appears well-developed and well-nourished.  HENT:  Head: Normocephalic and atraumatic.  Cardiovascular: Normal rate and regular rhythm.   Pulmonary/Chest: Effort normal and breath sounds normal.  Musculoskeletal: Normal range of motion. He exhibits no edema and no tenderness.  Neurological: He is alert and oriented to person, place, and time. No cranial nerve deficit. He exhibits normal muscle tone. Coordination normal.  Skin: Skin is warm and dry.  Psychiatric: He has a normal mood and affect.    ED Course  Procedures (including critical care time)  Labs Reviewed  BASIC METABOLIC PANEL - Abnormal; Notable for the following:    Potassium 3.4 (*)    All other components within normal limits   Dg Chest 2 View  12/22/2011  *RADIOLOGY REPORT*  Clinical Data: This and chest pain.  History of diabetes, hypertension and congestive heart failure.  CHEST - 2 VIEW  Comparison: 11/22/2011 and 08/04/2009.  Findings: Cardiomegaly, chronic vascular congestion and probable mild interstitial edema are similar to the prior examination. There is no confluent airspace opacity or significant pleural effusion.  The mediastinal contours are unchanged.  IMPRESSION: Stable cardiomegaly and interstitial pulmonary edema.  No new findings identified.  Original Report Authenticated By: Gerrianne Scale, M.D.     1. Leg pain       MDM  No swelling noted:neurovascularly intact;electrolytes WUJ:WJXB treat symptomatically, will have follow up for continued symptoms        Teressa Lower, NP 12/22/11 2112

## 2012-01-27 ENCOUNTER — Encounter: Payer: Self-pay | Admitting: Cardiology

## 2012-01-31 ENCOUNTER — Telehealth: Payer: Self-pay | Admitting: Internal Medicine

## 2012-01-31 NOTE — Telephone Encounter (Signed)
LOV,12,Echo faxed to [facility] @ [phone] 01/31/12/KM

## 2012-02-21 ENCOUNTER — Ambulatory Visit: Payer: Medicaid Other | Admitting: Cardiology

## 2012-02-21 ENCOUNTER — Encounter: Payer: Self-pay | Admitting: Cardiology

## 2012-03-03 ENCOUNTER — Encounter: Payer: Self-pay | Admitting: Cardiology

## 2012-05-18 ENCOUNTER — Encounter: Payer: Medicaid Other | Admitting: Cardiology

## 2012-05-29 ENCOUNTER — Encounter: Payer: Self-pay | Admitting: Cardiology

## 2012-06-09 ENCOUNTER — Ambulatory Visit (INDEPENDENT_AMBULATORY_CARE_PROVIDER_SITE_OTHER): Payer: Medicaid Other | Admitting: Cardiology

## 2012-06-09 ENCOUNTER — Encounter: Payer: Self-pay | Admitting: Cardiology

## 2012-06-09 VITALS — BP 180/120 | HR 84

## 2012-06-09 DIAGNOSIS — I5022 Chronic systolic (congestive) heart failure: Secondary | ICD-10-CM

## 2012-06-09 DIAGNOSIS — N259 Disorder resulting from impaired renal tubular function, unspecified: Secondary | ICD-10-CM

## 2012-06-09 DIAGNOSIS — I10 Essential (primary) hypertension: Secondary | ICD-10-CM

## 2012-06-09 NOTE — Assessment & Plan Note (Signed)
He seems to be euvolemic.  At this point, no change in therapy is indicated.  We have reviewed salt and fluid restrictions.  No further cardiovascular testing is indicated.   

## 2012-06-09 NOTE — Patient Instructions (Addendum)
The current medical regimen is effective;  continue present plan and medications.  Please have blood pressure checked every day for one week and faxed to Dr Antoine Poche at [phone].  Follow up in 1 year with Dr Antoine Poche.  You will receive a letter in the mail 2 months before you are due.  Please call us when you receive this letter to schedule your follow up appointment.

## 2012-06-09 NOTE — Assessment & Plan Note (Signed)
His blood pressure is quite elevated today but he didn't take his medications.  I have given written instructions for his rehabilitation center to take his blood pressure twice daily for a week and give these readings to me to further suggest med changes.

## 2012-06-09 NOTE — Progress Notes (Signed)
HPI The patient presents for followup of diastolic and systolic heart failure. He actually lives right now at a rehabilitation center as he's had inability to walk towards related to neuropathy. He came in a stretcher today via an ambulance service. He says he's had no acute cardiovascular problems since he was last seen. He says he'll occasionally get short of breath but this is mild. He does not describe PND or orthopnea. He does not describe chest pressure, neck or arm discomfort. He doesn't have any palpitations, presyncope or syncope. He says he weighs about 338 pounds. This will be down 13 pounds from previous but we were unable to weigh him today.  No Known Allergies  Current Outpatient Prescriptions  Medication Sig Dispense Refill  . acetaminophen (TYLENOL 8 HOUR) 650 MG CR tablet Take 650 mg by mouth every 8 (eight) hours as needed.      Marland Kitchen albuterol (PROVENTIL HFA;VENTOLIN HFA) 108 (90 BASE) MCG/ACT inhaler Inhale 1-2 puffs into the lungs every 6 (six) hours as needed for wheezing.  1 Inhaler  0  . bisacodyl (BISACODYL) 5 MG EC tablet Take 5 mg by mouth daily as needed.      . carvedilol (COREG) 25 MG tablet Take 50 mg by mouth 2 (two) times daily with a meal.       . Cholecalciferol 1000 UNITS tablet Take 1,000 Units by mouth daily.      . cloNIDine (CATAPRES) 0.1 MG tablet Take 0.1 mg by mouth 2 (two) times daily.      . cyanocobalamin 1000 MCG tablet Take 100 mcg by mouth daily.      . dabigatran (PRADAXA) 150 MG CAPS Take 1 capsule (150 mg total) by mouth every 12 (twelve) hours.  60 capsule  3  . dextrose (GLUTOSE) 40 % GEL Take 1 Tube by mouth.      . digoxin (LANOXIN) 0.25 MG tablet Take 250 mcg by mouth daily.        Marland Kitchen docusate sodium (COLACE) 100 MG capsule Take 100 mg by mouth 2 (two) times daily.      . DULoxetine HCl (CYMBALTA PO) Take 40 mg by mouth daily.      . ferrous sulfate 325 (65 FE) MG EC tablet Take 325 mg by mouth 3 (three) times daily with meals.      .  fexofenadine (ALLEGRA) 60 MG tablet Take 60 mg by mouth 2 (two) times daily.      . fish oil-omega-3 fatty acids 1000 MG capsule Take 2 g by mouth daily.      . fluticasone (FLONASE) 50 MCG/ACT nasal spray Place 2 sprays into the nose daily.      Marland Kitchen glucagon 1 MG injection Inject 1 mg into the vein once as needed.      Marland Kitchen HYDROcodone-acetaminophen (VICODIN) 5-500 MG per tablet Take 1 tablet by mouth every 6 (six) hours as needed.      . insulin aspart (NOVOLOG) 100 UNIT/ML injection Inject 3-10 Units into the skin 3 (three) times daily before meals. On Sliding Scale       . insulin aspart protamine-insulin aspart (NOVOLOG 70/30) (70-30) 100 UNIT/ML injection Inject into the skin. As directed      . insulin glargine (LANTUS) 100 UNIT/ML injection Inject 40 Units into the skin at bedtime. As directed      . lidocaine (LIDODERM) 5 % Place 1 patch onto the skin daily. Remove & Discard patch within 12 hours or as directed by MD      .  lisinopril (PRINIVIL,ZESTRIL) 10 MG tablet Take 10 mg by mouth daily.      . magnesium oxide (MAG-OX) 400 MG tablet Take 400 mg by mouth 2 (two) times daily.      . methimazole (TAPAZOLE) 10 MG tablet Take 10 mg by mouth 2 (two) times daily.      . Multiple Vitamin (MULTIVITAMIN) tablet Take 1 tablet by mouth daily.        . nitroGLYCERIN (NITROSTAT) 0.4 MG SL tablet Place 0.4 mg under the tongue every 5 (five) minutes as needed.      Marland Kitchen omeprazole (PRILOSEC) 20 MG capsule Take 20 mg by mouth daily.      . polyethylene glycol (MIRALAX / GLYCOLAX) packet Take 17 g by mouth daily.      . potassium chloride SA (K-DUR,KLOR-CON) 20 MEQ tablet Take 20 mEq by mouth 3 (three) times daily.       . promethazine (PHENERGAN) 25 MG tablet Take 25 mg by mouth every 6 (six) hours as needed.      . Thiamine HCl (THIAMINE PO) Take 10 mg by mouth.        Past Medical History  Diagnosis Date  . Coronary artery disease     Cardiac catheterization August 06, 2009, showed nonobstructive  coronary artery disease with distal diabetic vasculopathy. He had markedly elevated LV filling pressures and pulmonary venous hypertension with normal pulmonary vascular resistance suggesting he will normalize his pulmonary pressures with adequate diuresis.   . Renal insufficiency   . Sleep apnea   . Morbid obesity   . A-fib   . Chronic diastolic heart failure     Echo 8.25.2011:  Mod LVH; EF 50%; Mod LAE; RV dilation and ? dysfxn; mild RAE;   . Other primary cardiomyopathies   . Dyslipidemia   . Essential hypertension, benign   . Diabetes mellitus without mention of complication     Past Surgical History  Procedure Date  . None     ROS:  As stated in the HPI and negative for all other systems.  PHYSICAL EXAM BP 180/120  Pulse 84 GENERAL:  Well appearing NECK:  No jugular venous distention, waveform within normal limits, carotid upstroke brisk and symmetric, no bruits, no thyromegaly LYMPHATICS:  No cervical, inguinal adenopathy LUNGS:  Clear to auscultation bilaterally BACK:  No CVA tenderness CHEST:  Unremarkable HEART:  PMI not displaced or sustained,S1 and S2 within normal limits, no S3, no S4, no clicks, no rubs, no murmurs ABD:  Flat, positive bowel sounds normal in frequency in pitch, no bruits, no rebound, no guarding, no midline pulsatile mass, no hepatomegaly, no splenomegaly morbidly obese,  EXT:  2 plus pulses throughout, no edema, no cyanosis no clubbing SKIN:  No rashes no nodules   EKG:  Atrial fibrillation, rate 84, left axis deviation, probable old inferior infarct, premature ventricular contractions, no acute ST-T wave changes. 06/09/2012   ASSESSMENT AND PLAN

## 2012-06-09 NOTE — Assessment & Plan Note (Signed)
He said he had labs done recently at [facility]. This was prior apparently to being in rehabilitation. I will defer followup of previous renal insufficiency to his primary providers. He should also have a CBC if one has not been done recently. I will try to look for these results.

## 2013-02-06 ENCOUNTER — Encounter (HOSPITAL_COMMUNITY): Payer: Self-pay | Admitting: Emergency Medicine

## 2013-02-06 ENCOUNTER — Inpatient Hospital Stay (HOSPITAL_COMMUNITY)
Admission: EM | Admit: 2013-02-06 | Discharge: 2013-02-15 | DRG: 291 | Disposition: A | Discharge status: Skilled Nursing Facility | Payer: Medicaid Other | Attending: Internal Medicine | Admitting: Internal Medicine

## 2013-02-06 ENCOUNTER — Emergency Department (HOSPITAL_COMMUNITY): Payer: Medicaid Other

## 2013-02-06 DIAGNOSIS — G9341 Metabolic encephalopathy: Secondary | ICD-10-CM | POA: Diagnosis present

## 2013-02-06 DIAGNOSIS — I10 Essential (primary) hypertension: Secondary | ICD-10-CM | POA: Diagnosis present

## 2013-02-06 DIAGNOSIS — I259 Chronic ischemic heart disease, unspecified: Secondary | ICD-10-CM

## 2013-02-06 DIAGNOSIS — I2510 Atherosclerotic heart disease of native coronary artery without angina pectoris: Secondary | ICD-10-CM | POA: Diagnosis present

## 2013-02-06 DIAGNOSIS — Z794 Long term (current) use of insulin: Secondary | ICD-10-CM

## 2013-02-06 DIAGNOSIS — Z79899 Other long term (current) drug therapy: Secondary | ICD-10-CM

## 2013-02-06 DIAGNOSIS — L03019 Cellulitis of unspecified finger: Secondary | ICD-10-CM

## 2013-02-06 DIAGNOSIS — G4730 Sleep apnea, unspecified: Secondary | ICD-10-CM | POA: Diagnosis present

## 2013-02-06 DIAGNOSIS — E119 Type 2 diabetes mellitus without complications: Secondary | ICD-10-CM | POA: Diagnosis present

## 2013-02-06 DIAGNOSIS — I482 Chronic atrial fibrillation, unspecified: Secondary | ICD-10-CM | POA: Diagnosis present

## 2013-02-06 DIAGNOSIS — I4891 Unspecified atrial fibrillation: Secondary | ICD-10-CM | POA: Diagnosis present

## 2013-02-06 DIAGNOSIS — N189 Chronic kidney disease, unspecified: Secondary | ICD-10-CM | POA: Diagnosis present

## 2013-02-06 DIAGNOSIS — E0590 Thyrotoxicosis, unspecified without thyrotoxic crisis or storm: Secondary | ICD-10-CM

## 2013-02-06 DIAGNOSIS — I5032 Chronic diastolic (congestive) heart failure: Secondary | ICD-10-CM | POA: Diagnosis present

## 2013-02-06 DIAGNOSIS — I5022 Chronic systolic (congestive) heart failure: Secondary | ICD-10-CM

## 2013-02-06 DIAGNOSIS — G4733 Obstructive sleep apnea (adult) (pediatric): Secondary | ICD-10-CM | POA: Diagnosis present

## 2013-02-06 DIAGNOSIS — I428 Other cardiomyopathies: Secondary | ICD-10-CM

## 2013-02-06 DIAGNOSIS — E785 Hyperlipidemia, unspecified: Secondary | ICD-10-CM

## 2013-02-06 DIAGNOSIS — J9610 Chronic respiratory failure, unspecified whether with hypoxia or hypercapnia: Secondary | ICD-10-CM

## 2013-02-06 DIAGNOSIS — I509 Heart failure, unspecified: Secondary | ICD-10-CM | POA: Diagnosis present

## 2013-02-06 DIAGNOSIS — Z6841 Body Mass Index (BMI) 40.0 and over, adult: Secondary | ICD-10-CM

## 2013-02-06 DIAGNOSIS — R05 Cough: Secondary | ICD-10-CM

## 2013-02-06 DIAGNOSIS — R001 Bradycardia, unspecified: Secondary | ICD-10-CM | POA: Diagnosis present

## 2013-02-06 DIAGNOSIS — M25519 Pain in unspecified shoulder: Secondary | ICD-10-CM

## 2013-02-06 DIAGNOSIS — J9600 Acute respiratory failure, unspecified whether with hypoxia or hypercapnia: Secondary | ICD-10-CM

## 2013-02-06 DIAGNOSIS — I129 Hypertensive chronic kidney disease with stage 1 through stage 4 chronic kidney disease, or unspecified chronic kidney disease: Secondary | ICD-10-CM | POA: Diagnosis present

## 2013-02-06 DIAGNOSIS — R079 Chest pain, unspecified: Secondary | ICD-10-CM

## 2013-02-06 DIAGNOSIS — Z7902 Long term (current) use of antithrombotics/antiplatelets: Secondary | ICD-10-CM

## 2013-02-06 DIAGNOSIS — I498 Other specified cardiac arrhythmias: Secondary | ICD-10-CM | POA: Diagnosis present

## 2013-02-06 DIAGNOSIS — I5033 Acute on chronic diastolic (congestive) heart failure: Secondary | ICD-10-CM

## 2013-02-06 DIAGNOSIS — E669 Obesity, unspecified: Secondary | ICD-10-CM | POA: Diagnosis present

## 2013-02-06 DIAGNOSIS — N259 Disorder resulting from impaired renal tubular function, unspecified: Secondary | ICD-10-CM

## 2013-02-06 DIAGNOSIS — J9620 Acute and chronic respiratory failure, unspecified whether with hypoxia or hypercapnia: Secondary | ICD-10-CM | POA: Diagnosis present

## 2013-02-06 DIAGNOSIS — E875 Hyperkalemia: Secondary | ICD-10-CM | POA: Diagnosis present

## 2013-02-06 LAB — CBC WITH DIFFERENTIAL/PLATELET
Basophils Absolute: 0 10*3/uL (ref 0.0–0.1)
Lymphocytes Relative: 28 % (ref 12–46)
Neutro Abs: 2.8 10*3/uL (ref 1.7–7.7)
Neutrophils Relative %: 63 % (ref 43–77)
Platelets: 191 10*3/uL (ref 150–400)
RDW: 17.5 % — ABNORMAL HIGH (ref 11.5–15.5)
WBC: 4.5 10*3/uL (ref 4.0–10.5)

## 2013-02-06 LAB — POCT I-STAT 3, ART BLOOD GAS (G3+)
Bicarbonate: 31.3 mEq/L — ABNORMAL HIGH (ref 20.0–24.0)
Patient temperature: 37
TCO2: 33 mmol/L (ref 0–100)
pH, Arterial: 7.363 (ref 7.350–7.450)
pO2, Arterial: 82 mmHg (ref 80.0–100.0)

## 2013-02-06 LAB — COMPREHENSIVE METABOLIC PANEL
ALT: 9 U/L (ref 0–53)
AST: 19 U/L (ref 0–37)
CO2: 30 mEq/L (ref 19–32)
Calcium: 9.1 mg/dL (ref 8.4–10.5)
Chloride: 103 mEq/L (ref 96–112)
GFR calc non Af Amer: 58 mL/min — ABNORMAL LOW (ref >90)
Sodium: 142 mEq/L (ref 135–145)
Total Bilirubin: 0.5 mg/dL (ref 0.3–1.2)

## 2013-02-06 LAB — GLUCOSE, CAPILLARY: Glucose-Capillary: 101 mg/dL — ABNORMAL HIGH (ref 70–99)

## 2013-02-06 MED ORDER — ALBUTEROL SULFATE (5 MG/ML) 0.5% IN NEBU: 2.5000 mg | INHALATION_SOLUTION | RESPIRATORY_TRACT | Status: DC | PRN

## 2013-02-06 MED ORDER — LABETALOL HCL 5 MG/ML IV SOLN: 20.0000 mg | INTRAVENOUS | Status: DC | PRN

## 2013-02-06 MED ORDER — IPRATROPIUM BROMIDE 0.02 % IN SOLN: 0.5000 mg | RESPIRATORY_TRACT | Status: DC | PRN

## 2013-02-06 MED ORDER — HYDRALAZINE HCL 20 MG/ML IJ SOLN
20.0000 mg | INTRAMUSCULAR | Status: DC | PRN
  Filled 2013-02-06: qty 1

## 2013-02-06 MED ORDER — GLUCAGON HCL (RDNA) 1 MG IJ SOLR
INTRAVENOUS | Status: DC
  Filled 2013-02-06 (×6): qty 50

## 2013-02-06 MED ORDER — ATROPINE SULFATE 1 MG/ML IJ SOLN
INTRAMUSCULAR | Status: AC
  Filled 2013-02-06: qty 1

## 2013-02-06 MED ORDER — GLUCAGON HCL (RDNA) 1 MG IJ SOLR
5.0000 mg | INTRAVENOUS | Status: AC
  Administered 2013-02-06: 5 mg via INTRAVENOUS
  Filled 2013-02-06: qty 5

## 2013-02-06 MED ORDER — GLUCAGON HCL (RDNA) 1 MG IJ SOLR
INTRAVENOUS | Status: DC
  Administered 2013-02-07 – 2013-02-08 (×4): via INTRAVENOUS
  Filled 2013-02-06 (×15): qty 250

## 2013-02-06 NOTE — Progress Notes (Signed)
Patient removed from BiPAP and placed on 4L nasal cannula per order Zubielevitsky, MD.

## 2013-02-06 NOTE — ED Notes (Signed)
Dr. Patsi Sears called and stated that he would insert foley catheter on the 2300 unit

## 2013-02-06 NOTE — ED Provider Notes (Signed)
History     CSN: [id number]  Arrival date & time 02/06/13  1707   First MD Initiated Contact with Patient 02/06/13 1708      Chief Complaint  Patient presents with  . Shortness of Breath    (Consider location/radiation/quality/duration/timing/severity/associated sxs/prior treatment) HPI Comments: 62 y/o male with a history of CHF, A-fib, morbid obesity, renal insufficiency, CAD among other medical problems presents to the ED via EMS from nursing facility where he resides due to AMS. Report from EMS states he was found this morning in his bed non-responsive verbally with increasing upper and lower extremity edema. Gave Lasix 80mg  this morning with no output and tried to insert a Catheter without success. O2 sat 80% with EMS and was started on 2L O2 via nasal cannula increasing O2 sat to 95%. EMS found a-dib on EKG with HR varying from 40-110. BP was stable at 127/94. Patient is un-responsive and qualifies as a level 5 caveat due to AMS.  Patient is a 62 y.o. male presenting with shortness of breath. The history is provided by the EMS personnel. The history is limited by the condition of the patient (AMS).  Shortness of Breath   Past Medical History  Diagnosis Date  . 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 He had markedly elevated LV filling pressures and pulmonary venous hypertension with normal pulmonary vascular resistance suggesting he will normalize his pulmonary pressures with adequate diuresis.   . Renal insufficiency   . Sleep apnea   . Morbid obesity   . A-fib   . Chronic diastolic heart failure     Echo 8.25.2011:  Mod LVH; EF 50%; Mod LAE; RV dilation and ? dysfxn; mild RAE;   . Other primary cardiomyopathies   . Dyslipidemia   . Essential hypertension, benign   . Diabetes mellitus without mention of complication     Past Surgical History  Procedure Laterality  Date  . None      Family History  Problem Relation Age of Onset  . Hypertension      Aunts    History  Substance Use Topics  . Smoking status: Never Smoker   . Smokeless tobacco: Not on file  . Alcohol Use: No      Review of Systems  Unable to perform ROS: Mental status change  Respiratory: Positive for shortness of breath.     Allergies  Review of patient's allergies indicates no known allergies.  Home Medications   Current Outpatient Rx  Name  Route  Sig  Dispense  Refill  . carvedilol (COREG) 25 MG tablet   Oral   Take 25 mg by mouth 2 (two) times daily with a meal.          . Cholecalciferol 1000 UNITS tablet   Oral   Take 1,000 Units by mouth daily.         . cloNIDine (CATAPRES) 0.1 MG tablet   Oral   Take 0.1 mg by mouth 2 (two) times daily.         . cyanocobalamin 1000 MCG tablet   Oral   Take 1,000 mcg by mouth daily.          . dabigatran (PRADAXA) 150 MG CAPS   Oral   Take 1 capsule (150 mg total) by mouth every 12 (twelve) hours.   60 capsule   3   . DIGOXIN PO   Oral   Take  0.375 mg by mouth daily.         . DULoxetine HCl (CYMBALTA PO)   Oral   Take 40 mg by mouth daily.         . ferrous sulfate 325 (65 FE) MG EC tablet   Oral   Take 325 mg by mouth daily.          . fexofenadine (ALLEGRA) 30 MG tablet   Oral   Take 30 mg by mouth as needed. For allergies         . furosemide (LASIX) 40 MG tablet   Oral   Take 40 mg by mouth daily.         Marland Kitchen gabapentin (NEURONTIN) 300 MG capsule   Oral   Take 300 mg by mouth 3 (three) times daily.         . hydrALAZINE (APRESOLINE) 10 MG tablet   Oral   Take 10 mg by mouth as needed.         . insulin glargine (LANTUS) 100 UNIT/ML injection   Subcutaneous   Inject 25 Units into the skin at bedtime.          Marland Kitchen ipratropium-albuterol (DUONEB) 0.5-2.5 (3) MG/3ML SOLN   Nebulization   Take 3 mLs by nebulization every 6 (six) hours as needed. For shortness of  breath or wheezing         . lidocaine (LIDODERM) 5 %   Transdermal   Place 1 patch onto the skin daily. Remove & Discard patch within 12 hours or as directed by MD         . lisinopril (PRINIVIL,ZESTRIL) 30 MG tablet   Oral   Take 30 mg by mouth daily.         . metFORMIN (GLUCOPHAGE) 500 MG tablet   Oral   Take 1,000 mg by mouth 2 (two) times daily with a meal.         . methimazole (TAPAZOLE) 10 MG tablet   Oral   Take 10 mg by mouth daily.          Marland Kitchen omeprazole (PRILOSEC) 20 MG capsule   Oral   Take 20 mg by mouth daily.         . potassium chloride SA (K-DUR,KLOR-CON) 20 MEQ tablet   Oral   Take 20 mEq by mouth daily.          . promethazine (PHENERGAN) 25 MG tablet   Oral   Take 25 mg by mouth every 6 (six) hours as needed for nausea.          . sodium chloride (OCEAN) 0.65 % nasal spray   Nasal   Place 1 spray into the nose 2 (two) times daily.         . Thiamine HCl (THIAMINE PO)   Oral   Take 10 mg by mouth daily.         Marland Kitchen acetaminophen (TYLENOL 8 HOUR) 650 MG CR tablet   Oral   Take 650 mg by mouth every 8 (eight) hours as needed.         . bisacodyl (BISACODYL) 5 MG EC tablet   Oral   Take 5 mg by mouth daily as needed.         . docusate sodium (COLACE) 100 MG capsule   Oral   Take 100 mg by mouth 2 (two) times daily.         . magnesium oxide (MAG-OX) 400 MG tablet   Oral  Take 400 mg by mouth 2 (two) times daily.         . nitroGLYCERIN (NITROSTAT) 0.4 MG SL tablet   Sublingual   Place 0.4 mg under the tongue every 5 (five) minutes as needed.         . polyethylene glycol (MIRALAX / GLYCOLAX) packet   Oral   Take 17 g by mouth daily.           BP 129/103  Pulse 60  SpO2 97%  Physical Exam  Nursing note and vitals reviewed. Constitutional: He appears distressed. Nasal cannula in place.  Morbidly obese, responsive to painful stimuli, non-communicative.  HENT:  Head: Normocephalic and atraumatic.   Mouth/Throat: Mucous membranes are dry.  Eyes: Conjunctivae are normal.  Neck: Neck supple.  Cardiovascular: An irregularly irregular rhythm present. Bradycardia present.  Exam reveals decreased pulses (distal).   +3 pitting edema LE bilateral and bilateral wrists  Pulmonary/Chest: Tachypnea noted. He is in respiratory distress. He has wheezes (scattered). He has rales (scattered).  Abdominal: Bowel sounds are normal.  Morbid obesity.  Neurological: He is unresponsive. GCS eye subscore is 2. GCS verbal subscore is 2. GCS motor subscore is 2.  Skin: Skin is warm and dry.  Psychiatric: He is noncommunicative.    ED Course  Procedures (including critical care time)  Labs Reviewed  CULTURE, BLOOD (ROUTINE X 2)  CULTURE, BLOOD (ROUTINE X 2)  CBC WITH DIFFERENTIAL  COMPREHENSIVE METABOLIC PANEL  LACTIC ACID, PLASMA  URINALYSIS, ROUTINE W REFLEX MICROSCOPIC  PRO B NATRIURETIC PEPTIDE  DIGOXIN LEVEL  BLOOD GAS, ARTERIAL   Date: 02/06/2013  Rate: 72  Rhythm: atrial fibrillation  QRS Axis: right  Intervals: low voltage  ST/T Wave abnormalities: nonspecific T wave changes  Conduction Disutrbances:none  Narrative Interpretation: afib, ventricular bigeminy, low voltage throughout, probable RVH, borderline t abnormalities, inferior leads  Old EKG Reviewed: unchanged   Dg Chest Port 1 View  02/06/2013  *RADIOLOGY REPORT*  Clinical Data: CHF  PORTABLE CHEST - 1 VIEW  Comparison: Prior chest x-ray 12/22/2011  Findings: Increased pulmonary vascular congestion now with mild interstitial edema.  Marked enlargement of the cardiopericardial silhouette similar to prior.  Enlarged at venous vascular stripe along the right the tracheal contour.  Small right pleural effusion.  No acute osseous abnormality.  IMPRESSION:  1.  Pulmonary edema and small right effusion consistent with CHF. 2.  Stable cardiomegaly.   Original Report Authenticated By: Malachy Moan, M.D.      1. CHF (congestive heart  failure)       MDM  62 y/o male in CHF. He was placed on biPAP. He will be admitted to critical care. Dr. Blinda Leatherwood also took part in this patient's care and agrees with plan of care.   CRITICAL CARE Performed by: Johnnette Gourd   Total critical care time: 47  Critical care time was exclusive of separately billable procedures and treating other patients.  Critical care was necessary to treat or prevent imminent or life-threatening deterioration.  Critical care was time spent personally by me on the following activities: development of treatment plan with patient and/or surrogate as well as nursing, discussions with consultants, evaluation of patient's response to treatment, examination of patient, obtaining history from patient or surrogate, ordering and performing treatments and interventions, ordering and review of laboratory studies, ordering and review of radiographic studies, pulse oximetry and re-evaluation of patient's condition.    Trevor Mace, PA-C 02/06/13 2131

## 2013-02-06 NOTE — ED Notes (Signed)
[facility] reports patient comes from nursing home facility with SOB and increased confused and not talking to nursing home staff.  Pt was reported to have increaesd edema in legs and hands.  80 mg PO of Lasix was given with no relief in swelling or urine output/pt also had decreased O2 saturation in the 80s.  Upon [facility] arrival pt was in a-fib with a CBG of 140; pt denies pain at this time.

## 2013-02-06 NOTE — ED Provider Notes (Signed)
Medical screening examination/treatment/procedure(s) were conducted as a shared visit with non-physician practitioner(s) and myself.  I personally evaluated the patient during the encounter.  Patient seen for shortness of breath and mental status changes. Workup is consistent with congestive heart failure decompensation. Patient to be admitted to the hospital for further management.  Gilda Crease, MD 02/06/13 2151

## 2013-02-06 NOTE — ED Notes (Signed)
Conversation with [facility] ([place]) charge RN in reference to DNR or POA records for patient. No records available [facility] [phone]

## 2013-02-06 NOTE — Progress Notes (Signed)
Patient has low title volumes and minute ventilation. Patient also in bradycardia. Felipa Eth, RN notified.

## 2013-02-07 ENCOUNTER — Inpatient Hospital Stay (HOSPITAL_COMMUNITY): Payer: Medicaid Other

## 2013-02-07 DIAGNOSIS — J9620 Acute and chronic respiratory failure, unspecified whether with hypoxia or hypercapnia: Secondary | ICD-10-CM

## 2013-02-07 DIAGNOSIS — R001 Bradycardia, unspecified: Secondary | ICD-10-CM | POA: Diagnosis present

## 2013-02-07 DIAGNOSIS — I5033 Acute on chronic diastolic (congestive) heart failure: Principal | ICD-10-CM

## 2013-02-07 DIAGNOSIS — J9610 Chronic respiratory failure, unspecified whether with hypoxia or hypercapnia: Secondary | ICD-10-CM | POA: Diagnosis present

## 2013-02-07 DIAGNOSIS — E875 Hyperkalemia: Secondary | ICD-10-CM

## 2013-02-07 DIAGNOSIS — I5032 Chronic diastolic (congestive) heart failure: Secondary | ICD-10-CM | POA: Diagnosis present

## 2013-02-07 HISTORY — DX: Acute and chronic respiratory failure, unspecified whether with hypoxia or hypercapnia: J96.20

## 2013-02-07 HISTORY — DX: Hyperkalemia: E87.5

## 2013-02-07 LAB — CBC
MCH: 29.1 pg (ref 26.0–34.0)
Platelets: 178 10*3/uL (ref 150–400)
RBC: 5.29 MIL/uL (ref 4.22–5.81)

## 2013-02-07 LAB — POCT I-STAT 3, ART BLOOD GAS (G3+)
Acid-Base Excess: 6 mmol/L — ABNORMAL HIGH (ref 0.0–2.0)
Acid-Base Excess: 7 mmol/L — ABNORMAL HIGH (ref 0.0–2.0)
Bicarbonate: 34.6 mEq/L — ABNORMAL HIGH (ref 20.0–24.0)
Bicarbonate: 34.2 mEq/L — ABNORMAL HIGH (ref 20.0–24.0)
O2 Saturation: 96 %
Patient temperature: 97.5
TCO2: 37 mmol/L (ref 0–100)
TCO2: 36 mmol/L (ref 0–100)

## 2013-02-07 LAB — PHOSPHORUS: Phosphorus: 4.1 mg/dL (ref 2.3–4.6)

## 2013-02-07 LAB — URINE MICROSCOPIC-ADD ON

## 2013-02-07 LAB — URINALYSIS, ROUTINE W REFLEX MICROSCOPIC
Bilirubin Urine: NEGATIVE
Protein, ur: 100 mg/dL — AB
Urobilinogen, UA: 1 mg/dL (ref 0.0–1.0)

## 2013-02-07 LAB — BASIC METABOLIC PANEL
BUN: 30 mg/dL — ABNORMAL HIGH (ref 6–23)
CO2: 27 mEq/L (ref 19–32)
Calcium: 8.9 mg/dL (ref 8.4–10.5)
Calcium: 8.6 mg/dL (ref 8.4–10.5)
GFR calc non Af Amer: 67 mL/min — ABNORMAL LOW (ref >90)
GFR calc non Af Amer: 63 mL/min — ABNORMAL LOW (ref >90)
Glucose, Bld: 80 mg/dL (ref 70–99)
Sodium: 140 mEq/L (ref 135–145)
Sodium: 144 mEq/L (ref 135–145)

## 2013-02-07 LAB — MAGNESIUM: Magnesium: 1.7 mg/dL (ref 1.5–2.5)

## 2013-02-07 LAB — GLUCOSE, CAPILLARY
Glucose-Capillary: 118 mg/dL — ABNORMAL HIGH (ref 70–99)
Glucose-Capillary: 97 mg/dL (ref 70–99)

## 2013-02-07 LAB — RAPID URINE DRUG SCREEN, HOSP PERFORMED
Barbiturates: NOT DETECTED
Opiates: NOT DETECTED
Tetrahydrocannabinol: NOT DETECTED

## 2013-02-07 MED ORDER — SODIUM CHLORIDE 0.9 % IJ SOLN
10.0000 mL | INTRAMUSCULAR | Status: DC | PRN
  Administered 2013-02-07 – 2013-02-09 (×3): 10 mL
  Administered 2013-02-09: 20 mL

## 2013-02-07 MED ORDER — CHLORHEXIDINE GLUCONATE 0.12 % MT SOLN
15.0000 mL | Freq: Two times a day (BID) | OROMUCOSAL | Status: DC
  Administered 2013-02-07 – 2013-02-08 (×2): 15 mL via OROMUCOSAL
  Filled 2013-02-07 (×2): qty 15

## 2013-02-07 MED ORDER — HEPARIN BOLUS VIA INFUSION
5000.0000 [IU] | Freq: Once | INTRAVENOUS | Status: AC
  Administered 2013-02-07: 5000 [IU] via INTRAVENOUS
  Filled 2013-02-07: qty 5000

## 2013-02-07 MED ORDER — PANTOPRAZOLE SODIUM 40 MG IV SOLR
40.0000 mg | INTRAVENOUS | Status: DC
  Administered 2013-02-07 – 2013-02-08 (×2): 40 mg via INTRAVENOUS
  Filled 2013-02-07 (×5): qty 40

## 2013-02-07 MED ORDER — FUROSEMIDE 10 MG/ML IJ SOLN
INTRAMUSCULAR | Status: AC
  Administered 2013-02-07: 80 mg
  Filled 2013-02-07: qty 8

## 2013-02-07 MED ORDER — HYDRALAZINE HCL 20 MG/ML IJ SOLN
20.0000 mg | Freq: Four times a day (QID) | INTRAMUSCULAR | Status: DC
  Administered 2013-02-07 – 2013-02-10 (×11): 20 mg via INTRAVENOUS
  Filled 2013-02-07 (×17): qty 1

## 2013-02-07 MED ORDER — HEPARIN (PORCINE) IN NACL 100-0.45 UNIT/ML-% IJ SOLN
1600.0000 [IU]/h | INTRAMUSCULAR | Status: DC
  Administered 2013-02-07 (×2): 1600 [IU]/h via INTRAVENOUS
  Filled 2013-02-07 (×5): qty 250

## 2013-02-07 MED ORDER — DEXTROSE 50 % IV SOLN
INTRAVENOUS | Status: AC
  Filled 2013-02-07: qty 50

## 2013-02-07 MED ORDER — BIOTENE DRY MOUTH MT LIQD
15.0000 mL | Freq: Two times a day (BID) | OROMUCOSAL | Status: DC
  Administered 2013-02-07: 15 mL via OROMUCOSAL

## 2013-02-07 MED ORDER — INSULIN ASPART 100 UNIT/ML ~~LOC~~ SOLN
2.0000 [IU] | SUBCUTANEOUS | Status: DC
  Administered 2013-02-08: 2 [IU] via SUBCUTANEOUS

## 2013-02-07 MED ORDER — CHLORHEXIDINE GLUCONATE CLOTH 2 % EX PADS
6.0000 | MEDICATED_PAD | Freq: Every day | CUTANEOUS | Status: AC
  Administered 2013-02-07 – 2013-02-11 (×5): 6 via TOPICAL

## 2013-02-07 MED ORDER — SODIUM CHLORIDE 0.9 % IJ SOLN
10.0000 mL | Freq: Two times a day (BID) | INTRAMUSCULAR | Status: DC
  Administered 2013-02-07: 30 mL
  Administered 2013-02-08 – 2013-02-09 (×3): 10 mL

## 2013-02-07 MED ORDER — FUROSEMIDE 10 MG/ML IJ SOLN
40.0000 mg | Freq: Four times a day (QID) | INTRAMUSCULAR | Status: DC
  Administered 2013-02-07 (×2): 40 mg via INTRAVENOUS
  Filled 2013-02-07 (×6): qty 4

## 2013-02-07 MED ORDER — FUROSEMIDE 10 MG/ML IJ SOLN
80.0000 mg | Freq: Four times a day (QID) | INTRAMUSCULAR | Status: AC
  Administered 2013-02-07 – 2013-02-08 (×6): 80 mg via INTRAVENOUS
  Filled 2013-02-07 (×6): qty 8

## 2013-02-07 MED ORDER — POTASSIUM CHLORIDE 10 MEQ/50ML IV SOLN
10.0000 meq | INTRAVENOUS | Status: AC
  Administered 2013-02-07 (×3): 10 meq via INTRAVENOUS
  Filled 2013-02-07: qty 150

## 2013-02-07 MED ORDER — MUPIROCIN 2 % EX OINT
1.0000 "application " | TOPICAL_OINTMENT | Freq: Two times a day (BID) | CUTANEOUS | Status: AC
  Administered 2013-02-07 – 2013-02-11 (×10): 1 via NASAL
  Filled 2013-02-07: qty 22

## 2013-02-07 NOTE — H&P (Signed)
PULMONARY  / CRITICAL CARE MEDICINE  Name: [name] MRN: [id number] DOB: 1951-09-17    ADMISSION DATE:  02/06/2013 CONSULTATION DATE:  02/06/2013  REFERRING MD :  EDP  CHIEF COMPLAINT:  Acute encephalopathy  BRIEF PATIENT DESCRIPTION: 62 yo morbidly obese with OSA/OHS (not on positive pressure therapy) sent form NH after found being "less responsive then usual".  In ED:  Placed on BiPAP, noted to be bradycardic without hypotension.  SIGNIFICANT EVENTS / STUDIES:  2/18  Admitted with acute encephalopathy, placed on BiPAP  LINES / TUBES: Foley 2/18 >>> DO NOT REMOVE (Placed by Urology)  CULTURES:  ANTIBIOTICS:  The patient is encephalopathic and unable to provide history, which was obtained for available medical records.  HISTORY OF PRESENT ILLNESS:  62 yo morbidly obese with OSA/OHS (not on positive pressure therapy) sent form NH after found being "less responsive then usual".  In ED:  Placed on BiPAP, noted to be bradycardic without hypotension.  PAST MEDICAL HISTORY :  Past Medical History  Diagnosis Date  . 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 He had markedly elevated LV filling pressures and pulmonary venous hypertension with normal pulmonary vascular resistance suggesting he will normalize his pulmonary pressures with adequate diuresis.   . Renal insufficiency   . Sleep apnea   . Morbid obesity   . A-fib   . Chronic diastolic heart failure     Echo 8.25.2011:  Mod LVH; EF 50%; Mod LAE; RV dilation and ? dysfxn; mild RAE;   . Other primary cardiomyopathies   . Dyslipidemia   . Essential hypertension, benign   . Diabetes mellitus without mention of complication    Past Surgical History  Procedure Laterality Date  . None     Prior to Admission medications   Medication Sig Start Date End Date Taking? Authorizing Provider  albuterol (PROVENTIL) (2.5  MG/3ML) 0.083% nebulizer solution Take 2.5 mg by nebulization every 2 (two) hours as needed for wheezing or shortness of breath.   Yes Historical Provider, MD  carvedilol (COREG) 25 MG tablet Take 25 mg by mouth 2 (two) times daily with a meal.    Yes Historical Provider, MD  Cholecalciferol 1000 UNITS tablet Take 1,000 Units by mouth daily.   Yes Historical Provider, MD  cloNIDine (CATAPRES) 0.1 MG tablet Take 0.1 mg by mouth 2 (two) times daily.   Yes Historical Provider, MD  cyanocobalamin 1000 MCG tablet Take 1,000 mcg by mouth daily.    Yes Historical Provider, MD  dabigatran (PRADAXA) 150 MG CAPS Take 1 capsule (150 mg total) by mouth every 12 (twelve) hours. 08/26/11  Yes Dolores Patty, MD  digoxin (LANOXIN) 0.125 MG tablet Take 0.375 mg by mouth daily.   Yes Historical Provider, MD  DULoxetine (CYMBALTA) 20 MG capsule Take 40 mg by mouth daily.   Yes Historical Provider, MD  ferrous sulfate 325 (65 FE) MG EC tablet Take 325 mg by mouth daily.    Yes Historical Provider, MD  furosemide (LASIX) 40 MG tablet Take 40 mg by mouth daily.   Yes Historical Provider, MD  FUROSEMIDE IJ Inject 80 mg into the muscle once.   Yes Historical Provider, MD  gabapentin (NEURONTIN) 300 MG capsule Take 300 mg by mouth 3 (three) times daily.   Yes Historical Provider, MD  hydrALAZINE (APRESOLINE) 10 MG tablet Take 10 mg by mouth as needed (if systolic blood pressure is over 160).  Yes Historical Provider, MD  insulin glargine (LANTUS) 100 UNIT/ML injection Inject 25 Units into the skin at bedtime.    Yes Historical Provider, MD  ipratropium-albuterol (DUONEB) 0.5-2.5 (3) MG/3ML SOLN Take 3 mLs by nebulization every 6 (six) hours as needed. For shortness of breath or wheezing   Yes Historical Provider, MD  ivermectin (STROMECTOL) 3 MG TABS Take 36 mg by mouth once.   Yes Historical Provider, MD  lidocaine (LIDODERM) 5 % Place 1 patch onto the skin daily. Remove & Discard patch within 12 hours or as directed  by MD   Yes Historical Provider, MD  lisinopril (PRINIVIL,ZESTRIL) 30 MG tablet Take 30 mg by mouth daily.   Yes Historical Provider, MD  magnesium oxide (MAG-OX) 400 MG tablet Take 400 mg by mouth 2 (two) times daily.   Yes Historical Provider, MD  metFORMIN (GLUCOPHAGE) 500 MG tablet Take 1,000 mg by mouth 2 (two) times daily with a meal.   Yes Historical Provider, MD  methimazole (TAPAZOLE) 10 MG tablet Take 10 mg by mouth daily.    Yes Historical Provider, MD  omeprazole (PRILOSEC) 20 MG capsule Take 20 mg by mouth daily.   Yes Historical Provider, MD  polyethylene glycol (MIRALAX / GLYCOLAX) packet Take 17 g by mouth at bedtime.    Yes Historical Provider, MD  potassium chloride SA (K-DUR,KLOR-CON) 20 MEQ tablet Take 20 mEq by mouth daily.    Yes Historical Provider, MD  promethazine (PHENERGAN) 25 MG tablet Take 25 mg by mouth every 6 (six) hours as needed for nausea.    Yes Historical Provider, MD  sodium chloride (OCEAN) 0.65 % nasal spray Place 1 spray into the nose 2 (two) times daily.   Yes Historical Provider, MD  Thiamine HCl (THIAMINE PO) Take 10 mg by mouth daily.   Yes Historical Provider, MD   No Known Allergies  FAMILY HISTORY:  Family History  Problem Relation Age of Onset  . Hypertension      Aunts   SOCIAL HISTORY:  reports that he has never smoked. He does not have any smokeless tobacco history on file. He reports that he does not drink alcohol or use illicit drugs.  REVIEW OF SYSTEMS:  Unable to provide.  INTERVAL HISTORY:  VITAL SIGNS: Temp:  [97.2 F (36.2 C)] 97.2 F (36.2 C) (02/18 1729) Pulse Rate:  [30-89] 48 (02/19 0000) Resp:  [15-21] 16 (02/19 0000) BP: (49-173)/(13-110) 151/97 mmHg (02/19 0000) SpO2:  [87 %-100 %] 98 % (02/19 0000) Weight:  [189.8 kg (418 lb 6.9 oz)] 189.8 kg (418 lb 6.9 oz) (02/18 2300) HEMODYNAMICS:   VENTILATOR SETTINGS:   INTAKE / OUTPUT: Intake/Output   None     PHYSICAL EXAMINATION: General:  Appears acutely ill,  mechanically ventilated with BiPAP, synchronous, morbidly obese Neuro:  Encephalopathic, nonfocal, cough / gag diminished HEENT:  PERRL Cardiovascular:  Regular, bradycardic Lungs:  Bilateral diminished air entry, no w/r/r Abdomen:  Obese, nontender Musculoskeletal:  Moves all extremities, anasarca Skin:  Intact  LABS:  Recent Labs Lab 02/06/13 1826 02/06/13 1830  HGB  --  14.0  WBC  --  4.5  PLT  --  191  NA  --  142  K  --  4.2  CL  --  103  CO2  --  30  GLUCOSE  --  111*  BUN  --  28*  CREATININE  --  1.30  CALCIUM  --  9.1  AST  --  19  ALT  --  9  ALKPHOS  --  61  BILITOT  --  0.5  PROT  --  7.9  ALBUMIN  --  2.6*  LATICACIDVEN  --  1.9  PROBNP  --  15989.0*  PHART 7.363  --   PCO2ART 55.1*  --   PO2ART 82.0  --     Recent Labs Lab 02/06/13 1828  GLUCAP 101*   CXR:  2/18 >>> Pulmonary edema and small right effusion  ASSESSMENT / PLAN:  PULMONARY A:  Severe OSA/OHS, not on positive pressure therapy.  Chronic respiratory failure. P:   Gaol SpO2>92, pH>7.30 BiPAP, will need to continue upon d/c Wold avoid endotracheal intubation as suspect difficult airway and in transition to what? Trend ABG / CXR Bronchodilators PRN  CARDIOVASCULAR A:  Bradycardia in setting of Coreg, Clonidine and Digoxin (normal level on admission).  Hemodynamically stable.  Exacerbation of chronic diastolic CHF. H/o AF, CAD, dyslipidemia. P:  Goal MAP>60 Hold Digoxin, Coreg, Lisinopril, Clonidine Glucagon bolus and gtt Dopamine if hypotensive   RENAL A:  Chronic renal failure.  Anasarca. P:   Trend BMP No IVF Lasix 40 q6h  GASTROINTESTINAL A:  No active issues. P:   NPO as on BiPAP GI px is not indicated  HEMATOLOGIC A:  On Pradaxa preadmission for AF. P:  Trend CBC Heparin gtt per Pharmacy as NPO  INFECTIOUS A:  No evidence of acute infection. P:   No interventions required  ENDOCRINE  A:  Morbid obesity. DM2, insulin dependent, controlled. P:   ICU  Glycemic Control Protocol, Phase 1 Hold Metformin  NEUROLOGIC A:  Acute encephalopathy, likely due to severe untreated OSA, suspect not too different from baseline. P:   Head CT Drug screen  TODAY'S SUMMARY: Morbidly obese with severe untreated OSA with acute / subacute encephalopathy.  Bradycardia on Coreg, Digoxin and Clonidine, without hypotension.  Diastolic CHF, anasarca.  Difficult Foley.  Tonight:  Head CT, BiPAP, Glucagon gtt, Dopamine if needed.  Would try to avoid intubation.  DO NOT REMOVE FOLEY.   I have personally obtained a history, examined the patient, evaluated laboratory and imaging results, formulated the assessment and plan and placed orders.  CRITICAL CARE:  The patient is critically ill with multiple organ systems failure and requires high complexity decision making for assessment and support, frequent evaluation and titration of therapies, application of advanced monitoring technologies and extensive interpretation of multiple databases. Critical Care Time devoted to patient care services described in this note is 45 minutes.   Lonia Farber, MD  Pulmonary and Critical Care Medicine [facility] Pager: [phone]  02/07/2013, 12:33 AM

## 2013-02-07 NOTE — Progress Notes (Signed)
CBG 70 at 1149. Repeated at 1350 without treatment. CBG 100.  Dr. Delford Field notified of diuresis results and repeat potassium level of 3.5. Will give 3 runs potassium 10 meq each in 50 D5W once picc line in place.

## 2013-02-07 NOTE — Consult Note (Signed)
Urology Consult  Referring physician: Pollina  Reason for referral: Acute Urinary Retention  Chief Complaint:  Stupor, urinary retention  History of Present Illness: 62 y/o male with a history of CHF, A-fib, morbid obesity, renal insufficiency, CAD, and DM,   found this morning in his bed at the nursing facility non-responsive verbally,  with increasing upper and lower extremity edema.  Lasix 80mg  this morning given, but with no output. Foley catheter insertion failed.    O2 sat 80% with EMS and was started on 2L O2 via nasal cannula increasing O2 sat to 95%. EMS found a-Fib on EKG with HR varying from 40-110. BP was stable at 127/94. Patient is un-responsive.   The history is provided by the EMS personnel. Urology is consulted because of inability to pass foley catheter in ED.     Past Medical History  Diagnosis Date  . 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 He had markedly elevated LV filling pressures and pulmonary venous hypertension with normal pulmonary vascular resistance suggesting he will normalize his pulmonary pressures with adequate diuresis.   . Renal insufficiency   . Sleep apnea   . Morbid obesity   . A-fib   . Chronic diastolic heart failure     Echo 8.25.2011:  Mod LVH; EF 50%; Mod LAE; RV dilation and ? dysfxn; mild RAE;   . Other primary cardiomyopathies   . Dyslipidemia   . Essential hypertension, benign   . Diabetes mellitus without mention of complication    Past Surgical History  Procedure Laterality Date  . None      Medications: I have reviewed the patient's current medications. Allergies: No Known Allergies  Family History  Problem Relation Age of Onset  . Hypertension      Aunts   Social History:  reports that he has never smoked. He does not have any smokeless tobacco history on file. He reports that he does not drink alcohol or use illicit  drugs.  ROS: All systems are reviewed and negative except as noted. Pt unable to speak. No family member with him. No Nursing Home notes. No nursing home personal available for ROS.   Physical Exam:  Vital signs in last 24 hours: Temp:  [97.2 F (36.2 C)] 97.2 F (36.2 C) (02/18 1729) Pulse Rate:  [30-89] 30 (02/18 2215) Resp:  [15-21] 20 (02/18 2202) BP: (49-173)/(13-110) 126/70 mmHg (02/18 2215) SpO2:  [87 %-100 %] 87 % (02/18 2215)  Cardiovascular: Skin warm; not flushed Respiratory: Breaths quiet; no shortness of breath Abdomen: No masses. Massive abdominal girth. Anasarca.  Neurological: Normal sensation to touch. Pt is partially responsive-to questions, and to pain.  Musculoskeletal: Normal motor function arms and legs Lymphatics: No inguinal adenopathy Skin: No rashes Genitourinary: uncircumcised. Balanitis. Phimosis. Scrotum normal. Edematous. Testes wnl.   Laboratory Data:  Results for orders placed during the hospital encounter of 02/06/13 (from the past 72 hour(s))  POCT I-STAT 3, BLOOD GAS (G3+)     Status: Abnormal   Collection Time    02/06/13  6:26 PM      Result Value Range   pH, Arterial 7.363  7.350 - 7.450   pCO2 arterial 55.1 (*) 35.0 - 45.0 mmHg   pO2, Arterial 82.0  80.0 - 100.0 mmHg   Bicarbonate 31.3 (*) 20.0 - 24.0 mEq/L   TCO2 33  0 - 100 mmol/L   O2 Saturation 95.0     Acid-Base  Excess 4.0 (*) 0.0 - 2.0 mmol/L   Patient temperature 37.0 C     Collection site RADIAL, ALLEN'S TEST ACCEPTABLE     Drawn by Operator     Sample type ARTERIAL    GLUCOSE, CAPILLARY     Status: Abnormal   Collection Time    02/06/13  6:28 PM      Result Value Range   Glucose-Capillary 101 (*) 70 - 99 mg/dL  CBC WITH DIFFERENTIAL     Status: Abnormal   Collection Time    02/06/13  6:30 PM      Result Value Range   WBC 4.5  4.0 - 10.5 K/uL   RBC 4.92  4.22 - 5.81 MIL/uL   Hemoglobin 14.0  13.0 - 17.0 g/dL   HCT 16.1  09.6 - 04.5 %   MCV 87.8  78.0 - 100.0 fL    MCH 28.5  26.0 - 34.0 pg   MCHC 32.4  30.0 - 36.0 g/dL   RDW 40.9 (*) 81.1 - 91.4 %   Platelets 191  150 - 400 K/uL   Neutrophils Relative 63  43 - 77 %   Neutro Abs 2.8  1.7 - 7.7 K/uL   Lymphocytes Relative 28  12 - 46 %   Lymphs Abs 1.3  0.7 - 4.0 K/uL   Monocytes Relative 9  3 - 12 %   Monocytes Absolute 0.4  0.1 - 1.0 K/uL   Eosinophils Relative 1  0 - 5 %   Eosinophils Absolute 0.0  0.0 - 0.7 K/uL   Basophils Relative 0  0 - 1 %   Basophils Absolute 0.0  0.0 - 0.1 K/uL  COMPREHENSIVE METABOLIC PANEL     Status: Abnormal   Collection Time    02/06/13  6:30 PM      Result Value Range   Sodium 142  135 - 145 mEq/L   Potassium 4.2  3.5 - 5.1 mEq/L   Chloride 103  96 - 112 mEq/L   CO2 30  19 - 32 mEq/L   Glucose, Bld 111 (*) 70 - 99 mg/dL   BUN 28 (*) 6 - 23 mg/dL   Creatinine, Ser 7.82  0.50 - 1.35 mg/dL   Calcium 9.1  8.4 - 95.6 mg/dL   Total Protein 7.9  6.0 - 8.3 g/dL   Albumin 2.6 (*) 3.5 - 5.2 g/dL   AST 19  0 - 37 U/L   ALT 9  0 - 53 U/L   Alkaline Phosphatase 61  39 - 117 U/L   Total Bilirubin 0.5  0.3 - 1.2 mg/dL   GFR calc non Af Amer 58 (*) >90 mL/min   GFR calc Af Amer 67 (*) >90 mL/min   Comment:            The eGFR has been calculated     using the CKD EPI equation.     This calculation has not been     validated in all clinical     situations.     eGFR's persistently     <90 mL/min signify     possible Chronic Kidney Disease.  LACTIC ACID, PLASMA     Status: None   Collection Time    02/06/13  6:30 PM      Result Value Range   Lactic Acid, Venous 1.9  0.5 - 2.2 mmol/L  PRO B NATRIURETIC PEPTIDE     Status: Abnormal   Collection Time    02/06/13  6:30  PM      Result Value Range   Pro B Natriuretic peptide (BNP) 15989.0 (*) 0 - 125 pg/mL  DIGOXIN LEVEL     Status: None   Collection Time    02/06/13  6:30 PM      Result Value Range   Digoxin Level 1.5  0.8 - 2.0 ng/mL   No results found for this or any previous visit (from the past 240  hour(s)). Creatinine:  Recent Labs  02/06/13 1830  CREATININE 1.30    Xrays: See report/chart   Impression/Assessment:  Acute urinary retention in massively obese pt with anasarca, and DM, A-fib, CHF. He will need cystoscopy and manipulation for foley catheter insertion.   Plan:    Bedside flexible cysto and Council catheter insertion.   Conrad Zajkowski I 02/07/2013, 12:03 AM

## 2013-02-07 NOTE — Procedures (Signed)
Arterial Catheter Insertion Procedure Note [name] [id number] Oct 04, 1951  Procedure: Insertion of Arterial Catheter  Indications: Blood pressure monitoring  Procedure Details Consent: Risks of procedure as well as the alternatives and risks of each were explained to the (patient/caregiver).  Consent for procedure obtained. Time Out: Verified patient identification, verified procedure, site/side was marked, verified correct patient position, special equipment/implants available, medications/allergies/relevent history reviewed, required imaging and test results available.  Performed  Maximum sterile technique was used including antiseptics. Skin prep: Chlorhexidine; local anesthetic administered 20 gauge catheter was inserted into left radial artery using the Seldinger technique.  Evaluation Blood flow good; BP tracing good. Complications: No apparent complications.   [name] 02/07/2013

## 2013-02-07 NOTE — Op Note (Signed)
Pre-operative diagnosis : acute urinary retention  Postoperative diagnosis:  Same  Operation:  Flexible bedside cystoscopy with passage if 77F council catheter ( 10 cc balloon)  Surgeon:  S. Patsi Sears, MD  First assistant:  none  Anesthesia:   Lidocaine gel  Preparation:  After transfer to 2300 from the ED, the pt was stabilized in his bed, and armband double checked. The penis was identified, and prepped with Betadine and draped in the usual position.   Review history:  62 yo diabetic with CHF, morbid obesity ( 416 lbs), and anasarca, wit urinary retention. Now for foley cath placement.   Statement of  Likelihood of Success: Excellent. TIME-OUT observed.:  Procedure:  The flexible cystoscope was placed within the foreskin, and the area around the glans was filled with sterile water. Direct visualization of the penile glans was then possible, and it appeared as a grey, nodular glans. The meatus was identified, and an 0.038 guidewire was passed into the urethra, and into the bladder. The scope was withdrawn, leaving the wire. Over the guidewire, a 77F Council catheter was passed, with 10cc placed in the balloon.    150cc of clear urine was obtained. The foley was left to straight drainage.

## 2013-02-07 NOTE — Consult Note (Signed)
CARDIOLOGY CONSULT NOTE  Patient ID: [name] MRN: [id number], DOB/AGE: 1951-04-02   Admit date: 02/06/2013 Date of Consult: 02/07/2013   Primary Physician: Terald Sleeper, MD Primary Cardiologist: Ulyess Mort  HPI Patient is a 62yo morbidly obese man with history of OSA/OHS (not on CPAP), nonobstructive CAD (Cath 2010), atrial fib on pradaxa, Chronic diastolic HF, HTN, DM and HLD who was admitted on 02/06/13 after being less responsive than normal at his nursing home.  On my exam, patient is not arousable to sternal rub or his name, but RN reports he does follow commands occasionally.    Problem List  Past Medical History  Diagnosis Date  . 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 He had markedly elevated LV filling pressures and pulmonary venous hypertension with normal pulmonary vascular resistance suggesting he will normalize his pulmonary pressures with adequate diuresis.   . Renal insufficiency   . Sleep apnea   . Morbid obesity   . A-fib   . Chronic diastolic heart failure     Echo 8.25.2011:  Mod LVH; EF 50%; Mod LAE; RV dilation and ? dysfxn; mild RAE;   . Other primary cardiomyopathies   . Dyslipidemia   . Essential hypertension, benign   . Diabetes mellitus without mention of complication     Past Surgical History  Procedure Laterality Date  . None       Allergies No Known Allergies  Inpatient Medications  . antiseptic oral rinse  15 mL Mouth Rinse q12n4p  . atropine      . chlorhexidine  15 mL Mouth Rinse BID  . Chlorhexidine Gluconate Cloth  6 each Topical Q0600  . furosemide  80 mg Intravenous Q6H  . insulin aspart  2-6 Units Subcutaneous Q4H  . mupirocin ointment  1 application Nasal BID  . pantoprazole (PROTONIX) IV  40 mg Intravenous Q24H    Family History Family History  Problem Relation Age of Onset  . Hypertension      Aunts       Social History History   Social History  . Marital Status: Single    Spouse Name: N/A    Number of Children: N/A  . Years of Education: N/A   Occupational History  . Not on file.   Social History Main Topics  . Smoking status: Never Smoker   . Smokeless tobacco: Not on file  . Alcohol Use: No  . Drug Use: No  . Sexually Active: Not on file   Other Topics Concern  . 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 rate 16, height 5' 2.99" (1.6 m), weight 418 lb 6.9 oz (189.8 kg), SpO2 100.00%.  General: Morbidly obese Neuro: unable to arouse to sternal rub/voice command HEENT: BiPap  Neck: Supple without bruits, Hard to see JVP - appears up Lungs:  Decreased lung sounds b/l Heart: irregular rhythm, bradycardic, no s3, s4, or murmurs. Abdomen:  Soft, non-tender, morbidly obese, + distended, BS + x 4.  Extremities: Anasarca (3+ firm pitting edema)  Labs   Lab Results  Component Value Date   WBC 8.9 02/07/2013   HGB 15.4 02/07/2013   HCT 46.9 02/07/2013   MCV 88.7 02/07/2013   PLT 178 02/07/2013    Recent Labs Lab 02/06/13 1830 02/07/13 0507  NA 142 140  K 4.2 5.6*  CL 103 103  CO2 30 27  BUN 28* 30*  CREATININE 1.30 1.15  CALCIUM 9.1 8.9  PROT 7.9  --   BILITOT 0.5  --   ALKPHOS 61  --   ALT 9  --   AST 19  --   GLUCOSE 111* 106*   Lab Results  Component Value Date   CHOL 115 02/26/2011   HDL 24* 02/26/2011   LDLCALC 73 02/26/2011   TRIG 90 02/26/2011   Lab Results  Component Value Date   DDIMER 0.22 11/22/2011    Radiology/Studies  Dg  Chest Port 1 View  02/06/2013   Findings: Increased pulmonary vascular congestion now with mild interstitial edema.  Marked enlargement of the cardiopericardial silhouette similar to prior.  Enlarged at venous vascular stripe along the right the tracheal contour.  Small right pleural effusion.  No acute osseous abnormality.   IMPRESSION:  1.  Pulmonary edema and small right effusion consistent with CHF. 2.  Stable cardiomegaly.      ASSESSMENT AND PLAN Patient is a morbidly obese 61yo man with h/o CAD, chronic afib, OSA, DM & dCHF who presented on 02/06/13 with AMS.  Cardiology consulted on 02/07/13 for input regarding bradycardia.  #Bradycardia: On tele, patient appears to be in slow afib.  Patient's home regimen includes Clonidine 0.1mg  BID, Digoxin 0.375mg  daily (Dig level 1.1), and Coreg 25mg  BID, all of which may contribute to his current presentation.  -Agree with holding above meds  #Afib: as above; regarding anticoagulation, agree with Heparin and holding pradaxa for now since patient NPO  #Acute on Chronic dCHF: CXR, pBNP (16,109) and physical exam suggestive of fluid overload. -agree with Lasix 80mg  IV q6h (already given a total of 160mg  lasix since admission) -Monitor weights and in/outs -monitor renal function  #Acute on Chronic Respiratory Failure: on Bipap, mgmt per PCCM  #Hyperkalemia: on AM labs, but now has resolved; unclear etiology, but patient is on ACEI at home, which is being held. Respiratory acidosis may have been contributing.  Renal fxn normal. Not likely contributing to bradycardia, as patient's K was 4.2 at admission.  Being treated with lasix, monitor  #HTN: Hydralazine 20mg  IV q6h per PCCM  #DM: per PCCM, metformin being held  Signed, Stacy Gardner, MD, PGY2 02/07/2013, 11:07 AM   Patient seen and examined with Dr. Everardo Beals. We discussed all aspects of the encounter. I agree with the assessment and plan as stated above.   He has chronic AF now with very slow  ventricular response. However, BP is preserved. I think the major driver of his bradycardia is his severe apnea and CO2 narcosis. He is not interested in intubation or trach which I suspect he needs. For now, agree with BIPAP and holding all AV nodal blocking agents. He does not need pacing at this point (and I doubt he would accept this).   He is massively volume overload. Can consider lasix gtt, if needed. Will continue to follow.   Rahel Carlton,MD 11:49 AM

## 2013-02-07 NOTE — Progress Notes (Signed)
Assessed patient for PICC line placement and was unable to to access basilic due to the amount of edema . Cephalic vein was to small to accommodate a catheter. Patients nurse ware.

## 2013-02-07 NOTE — Procedures (Signed)
Central Venous Catheter Insertion Procedure Note Amritpal Shropshire [id number] 15-Aug-1951  Procedure: Insertion of Central Venous Catheter Indications: Assessment of intravascular volume, Drug and/or fluid administration and Frequent blood sampling  Procedure Details Consent: Risks of procedure as well as the alternatives and risks of each were explained to the (patient/caregiver).  Consent for procedure obtained. Time Out: Verified patient identification, verified procedure, site/side was marked, verified correct patient position, special equipment/implants available, medications/allergies/relevent history reviewed, required imaging and test results available.  Performed  Maximum sterile technique was used including antiseptics, cap, gloves, gown, hand hygiene, mask and sheet. Skin prep: Chlorhexidine; local anesthetic administered A antimicrobial bonded/coated triple lumen catheter was placed in the left internal jugular vein using the Seldinger technique.  Evaluation  Blood flow good Complications: No apparent complications Patient did tolerate procedure well. Chest X-ray ordered to verify placement.  CXR: pending.  Procedure performed under the direct supervision of Dr. Sung Amabile, and with the use of ultrasound for real-time vessel cannulation.   Joneen Roach  NP-S   Canary Brim, NP-C [facility] Pgr: [phone] or [phone] Dr Sung Amabile was present for the procedure.  02/07/2013, 5:09 PM

## 2013-02-07 NOTE — Plan of Care (Signed)
Problem: Phase I Progression Outcomes Goal: Dyspnea controlled at rest (HF) Outcome: Progressing On 40% BIPAP Goal: Pain controlled with appropriate interventions Outcome: Completed/Met Date Met:  02/07/13 No pain Goal: Voiding-avoid urinary catheter unless indicated Has urinary catheter inserted by urologist. Heavy diuresing

## 2013-02-07 NOTE — Progress Notes (Signed)
ANTICOAGULATION CONSULT NOTE   Pharmacy Consult for heparin Indication: atrial fibrillation  No Known Allergies  Labs:  Recent Labs  02/06/13 1830 02/07/13 0507 02/07/13 0811  HGB 14.0 15.4  --   HCT 43.2 46.9  --   PLT 191 178  --   HEPARINUNFRC  --   --  0.68  CREATININE 1.30 1.15  --     Estimated Creatinine Clearance: 106.4 ml/min (by C-G formula based on Cr of 1.15).   Assessment: 62 yo male admitted with acute encephalopathy. Patient is on dabigatran PTA for h/o atrial fibrillation.   HL therapeutic, CBC stable  Goal of Therapy:  Heparin level 0.3-0.7 units/ml Monitor platelets by anticoagulation protocol: Yes   Plan:  1. Continue heparin at 1600 units / hr 2. Follow up AM heparin level, CBC  Thank you. Okey Regal, PharmD [phone]  02/07/2013,10:16 AM

## 2013-02-07 NOTE — Progress Notes (Signed)
PULMONARY  / CRITICAL CARE MEDICINE  Name: [name] MRN: [id number] DOB: 08/27/1951    ADMISSION DATE:  02/06/2013 CONSULTATION DATE:  02/06/2013  REFERRING MD :  EDP  CHIEF COMPLAINT:  Acute encephalopathy  BRIEF PATIENT DESCRIPTION: 62 yo morbidly obese with OSA/OHS (not on positive pressure therapy) sent form NH after found being "less responsive then usual".  In ED:  Placed on BiPAP, noted to be bradycardic without hypotension.  SIGNIFICANT EVENTS / STUDIES:  2/18  Admitted with acute encephalopathy, placed on BiPAP  LINES / TUBES: Foley 2/18 >>> DO NOT REMOVE (Placed by Urology)  CULTURES: MRSA PCR 2/18: pos BCx2 2/18>>>  ANTIBIOTICS: None   Brief summary:  62 yo morbidly obese with OSA/OHS (not on positive pressure therapy) sent form NH after found being "less responsive then usual".  In ED:  Placed on BiPAP, noted to be bradycardic without hypotension.   INTERVAL HISTORY: Remains unresponsive on bipap, bradycardic.     VITAL SIGNS: Temp:  [97.2 F (36.2 C)-97.6 F (36.4 C)] 97.5 F (36.4 C) (02/19 0737) Pulse Rate:  [26-89] 38 (02/19 0700) Resp:  [0-22] 15 (02/19 0700) BP: (49-173)/(13-110) 129/63 mmHg (02/19 0700) SpO2:  [87 %-100 %] 100 % (02/19 0700) FiO2 (%):  [40 %-50 %] 50 % (02/19 0500) Weight:  [189.8 kg (418 lb 6.9 oz)] 189.8 kg (418 lb 6.9 oz) (02/19 0000) HEMODYNAMICS: No CVP   VENTILATOR SETTINGS: Vent Mode:  [-]  FiO2 (%):  [40 %-50 %] 50 % On bipap 16/8 BUR 12 Vt 510 Ve 9.1L  Fio2 .50  sats not picking up  INTAKE / OUTPUT: Intake/Output     02/18 0701 - 02/19 0700 02/19 0701 - 02/20 0700   I.V. (mL/kg) 303.4 (1.6)    Total Intake(mL/kg) 303.4 (1.6)    Urine (mL/kg/hr) 800    Total Output 800     Net -496.6          Stool Occurrence 1 x      PHYSICAL EXAMINATION: General:  Appears acutely ill, mechanically ventilated with BiPAP, synchronous, morbidly obese Neuro:  Encephalopathic, nonfocal, cough / gag diminished HEENT:   PERRL Cardiovascular:  Regular, bradycardic Lungs:  Bilateral diminished air entry, no w/r/r Abdomen:  Obese, nontender Musculoskeletal:  Moves all extremities, anasarca Skin:  Intact  LABS:  Recent Labs Lab 02/06/13 1826 02/06/13 1830 02/07/13 0507  HGB  --  14.0 15.4  WBC  --  4.5 8.9  PLT  --  191 178  NA  --  142 140  K  --  4.2 5.6*  CL  --  103 103  CO2  --  30 27  GLUCOSE  --  111* 106*  BUN  --  28* 30*  CREATININE  --  1.30 1.15  CALCIUM  --  9.1 8.9  MG  --   --  1.7  PHOS  --   --  4.1  AST  --  19  --   ALT  --  9  --   ALKPHOS  --  61  --   BILITOT  --  0.5  --   PROT  --  7.9  --   ALBUMIN  --  2.6*  --   LATICACIDVEN  --  1.9  --   PROBNP  --  15989.0*  --   PHART 7.363  --   --   PCO2ART 55.1*  --   --   PO2ART 82.0  --   --  Recent Labs Lab 02/06/13 1828 02/07/13 0437 02/07/13 0732  GLUCAP 101* 118* 92   CXR:  2/19 >>> Pulmonary edema and small right effusion  ASSESSMENT / PLAN:  PULMONARY A:  Severe OSA/OHS, not on positive pressure therapy.  Chronic respiratory failure. P:   Gaol SpO2>92, pH>7.30 BiPAP, continuous May yet need intubation/vent Trend ABG / CXR Bronchodilators PRN Diurese  CARDIOVASCULAR A:  Bradycardia in setting of Coreg, Clonidine and Digoxin (normal level on admission).  Hemodynamically stable.  Exacerbation of chronic diastolic CHF. H/o AF, CAD, dyslipidemia. P:  Goal MAP>60 Hold Digoxin, Coreg, Lisinopril, Clonidine Dig level Pt sees [facility] Cards: call for consult Glucagon bolus and gtt Dopamine if hypotensive   RENAL A:  Chronic renal failure.  Anasarca. Hyperkalemia P:   Trend BMP No IVF Lasix 80 q6h   GASTROINTESTINAL A:  No active issues. P:   NPO as on BiPAP Start SUP  HEMATOLOGIC A:  On Pradaxa preadmission for AF. P:  Trend CBC Heparin gtt per Pharmacy as NPO  INFECTIOUS A:  No evidence of acute infection. P:   No interventions required  ENDOCRINE  A:  Morbid obesity. DM2,  insulin dependent, controlled. P:   ICU Glycemic Control Protocol, Phase 1 Hold Metformin  NEUROLOGIC A:  Acute encephalopathy, likely due to severe untreated OSA, suspect not too different from baseline. P:   Head CT>>never done, d/t pt instability and AMS Drug screen  TODAY'S SUMMARY: Morbidly obese with severe untreated OSA with acute / subacute encephalopathy.  Bradycardia on Coreg, Digoxin and Clonidine, without hypotension.  Diastolic CHF, anasarca.  Difficult Foley.  Plan to cont bipap, glucagon, f/u ABG, get picc and art lines.  Cont heparin drip, cont lasix, f/u bmp, get Cardiology to see the patient  DO NOT REMOVE FOLEY.   I have personally obtained a history, examined the patient, evaluated laboratory and imaging results, formulated the assessment and plan and placed orders.  CRITICAL CARE:  The patient is critically ill with multiple organ systems failure and requires high complexity decision making for assessment and support, frequent evaluation and titration of therapies, application of advanced monitoring technologies and extensive interpretation of multiple databases. Critical Care Time devoted to patient care services described in this note is 40 minutes.   Shan Levans, MD Beeper  [phone]  Cell  [phone]  If no response or cell goes to voicemail, call beeper [phone]  Pulmonary and Critical Care Medicine [facility]   02/07/2013, 8:16 AM

## 2013-02-07 NOTE — Progress Notes (Signed)
Utilization Review Completed. 02/07/2013  

## 2013-02-07 NOTE — Progress Notes (Signed)
ANTICOAGULATION CONSULT NOTE - Initial Consult  Pharmacy Consult for heparin Indication: atrial fibrillation  No Known Allergies  Patient Measurements: Height: 5' 2.99" (160 cm) Weight: 418 lb 6.9 oz (189.8 kg) IBW/kg (Calculated) : 56.88 Heparin Dosing Weight: 107 kg  Vital Signs: Temp: 97.2 F (36.2 C) (02/18 1729) Temp src: Rectal (02/18 1729) BP: 151/97 mmHg (02/19 0000) Pulse Rate: 48 (02/19 0000)  Labs:  Recent Labs  02/06/13 1830  HGB 14.0  HCT 43.2  PLT 191  CREATININE 1.30    Estimated Creatinine Clearance: 94.1 ml/min (by C-G formula based on Cr of 1.3).   Medical History: Past Medical History  Diagnosis Date  . 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 He had markedly elevated LV filling pressures and pulmonary venous hypertension with normal pulmonary vascular resistance suggesting he will normalize his pulmonary pressures with adequate diuresis.   . Renal insufficiency   . Sleep apnea   . Morbid obesity   . A-fib   . Chronic diastolic heart failure     Echo 8.25.2011:  Mod LVH; EF 50%; Mod LAE; RV dilation and ? dysfxn; mild RAE;   . Other primary cardiomyopathies   . Dyslipidemia   . Essential hypertension, benign   . Diabetes mellitus without mention of complication     Medications:  Scheduled:  . atropine      . furosemide  40 mg Intravenous Q6H  . [COMPLETED] glucagon (GLUCAGEN) 5 MG IV  5 mg Intravenous STAT  . insulin aspart  2-6 Units Subcutaneous Q4H    Assessment: 62 yo male admitted with acute encephalopathy. Patient is on dabigatran PTA for h/o atrial fibrillation. Per nursing home MAG, last dose appears to be 2/18 in AM (~ 9:00). Baseline Hgb 14 and platelet 191. Pharmacy consulted to manage IV heparin as patient is NPO.   Goal of Therapy:  Heparin level 0.3-0.7 units/ml Monitor platelets by anticoagulation protocol: Yes   Plan:  1. Heparin 5000 unit IV bolus x 1, then IV infusion of 1600 units/hr.  2. Heparin level in 6 hours.  3. Daily heparin level, CBC 4. Follow-up anticoagulation plan.   Emeline Gins 02/07/2013,1:02 AM

## 2013-02-08 ENCOUNTER — Inpatient Hospital Stay (HOSPITAL_COMMUNITY): Payer: Medicaid Other

## 2013-02-08 DIAGNOSIS — I498 Other specified cardiac arrhythmias: Secondary | ICD-10-CM

## 2013-02-08 DIAGNOSIS — I517 Cardiomegaly: Secondary | ICD-10-CM

## 2013-02-08 DIAGNOSIS — I4891 Unspecified atrial fibrillation: Secondary | ICD-10-CM

## 2013-02-08 LAB — CBC
HCT: 40.8 % (ref 39.0–52.0)
Hemoglobin: 13.6 g/dL (ref 13.0–17.0)
MCHC: 33.3 g/dL (ref 30.0–36.0)
RBC: 4.75 MIL/uL (ref 4.22–5.81)

## 2013-02-08 LAB — GLUCOSE, CAPILLARY
Glucose-Capillary: 111 mg/dL — ABNORMAL HIGH (ref 70–99)
Glucose-Capillary: 111 mg/dL — ABNORMAL HIGH (ref 70–99)
Glucose-Capillary: 71 mg/dL (ref 70–99)

## 2013-02-08 LAB — BASIC METABOLIC PANEL
BUN: 29 mg/dL — ABNORMAL HIGH (ref 6–23)
CO2: 33 mEq/L — ABNORMAL HIGH (ref 19–32)
Chloride: 101 mEq/L (ref 96–112)
Chloride: 97 mEq/L (ref 96–112)
GFR calc Af Amer: 83 mL/min — ABNORMAL LOW (ref >90)
Glucose, Bld: 125 mg/dL — ABNORMAL HIGH (ref 70–99)
Glucose, Bld: 76 mg/dL (ref 70–99)
Potassium: 3.3 mEq/L — ABNORMAL LOW (ref 3.5–5.1)
Potassium: 3.5 mEq/L (ref 3.5–5.1)
Sodium: 141 mEq/L (ref 135–145)

## 2013-02-08 MED ORDER — DABIGATRAN ETEXILATE MESYLATE 150 MG PO CAPS
150.0000 mg | ORAL_CAPSULE | Freq: Two times a day (BID) | ORAL | Status: DC
  Administered 2013-02-08 – 2013-02-15 (×15): 150 mg via ORAL
  Filled 2013-02-08 (×16): qty 1

## 2013-02-08 MED ORDER — MAGNESIUM SULFATE 40 MG/ML IJ SOLN
2.0000 g | Freq: Once | INTRAMUSCULAR | Status: AC
  Administered 2013-02-08: 2 g via INTRAVENOUS
  Filled 2013-02-08 (×2): qty 50

## 2013-02-08 MED ORDER — POTASSIUM CHLORIDE 10 MEQ/50ML IV SOLN
10.0000 meq | INTRAVENOUS | Status: AC
  Administered 2013-02-08 (×4): 10 meq via INTRAVENOUS
  Filled 2013-02-08: qty 50
  Filled 2013-02-08: qty 150

## 2013-02-08 MED ORDER — HEPARIN (PORCINE) IN NACL 100-0.45 UNIT/ML-% IJ SOLN
1200.0000 [IU]/h | INTRAMUSCULAR | Status: DC
  Filled 2013-02-08: qty 250

## 2013-02-08 MED ORDER — INSULIN ASPART 100 UNIT/ML ~~LOC~~ SOLN
0.0000 [IU] | Freq: Three times a day (TID) | SUBCUTANEOUS | Status: DC
  Administered 2013-02-09 (×2): 4 [IU] via SUBCUTANEOUS
  Administered 2013-02-10 – 2013-02-11 (×5): 3 [IU] via SUBCUTANEOUS
  Administered 2013-02-11 – 2013-02-14 (×7): 4 [IU] via SUBCUTANEOUS
  Administered 2013-02-14: 3 [IU] via SUBCUTANEOUS
  Administered 2013-02-15: 4 [IU] via SUBCUTANEOUS

## 2013-02-08 MED ORDER — CARVEDILOL 12.5 MG PO TABS
12.5000 mg | ORAL_TABLET | Freq: Two times a day (BID) | ORAL | Status: DC
  Administered 2013-02-08 – 2013-02-14 (×12): 12.5 mg via ORAL
  Filled 2013-02-08 (×17): qty 1

## 2013-02-08 NOTE — Progress Notes (Addendum)
INITIAL NUTRITION ASSESSMENT  DOCUMENTATION CODES Per approved criteria  -Morbid Obesity   INTERVENTION:  If PO intake does not improve, consider short-term EN support initiation RD to follow for nutrition care plan  NUTRITION DIAGNOSIS: Inadequate oral intake related to acute encephalopathy as evidenced by no % meal intake recorded  Goal: Oral intake with meals to meet >/= 90% of estimated nutrition needs  Monitor:  PO intake, weight, labs, I/O's  Reason for Assessment: Low Braden  62 y.o. male  Admitting Dx: acute encephalopathy, acute urinary retention   ASSESSMENT: Patient presented via EMS from nursing facility due to AMS; found in bed non-responsive with increasing upper and lower extremity edema; in ED placed on BiPAP and noted to be bradycardic without hypotension.  Patient s/p procedure 2/19: FLEXIBLE BEDSIDE CYSTOSCOPY  RD unable to obtain nutrition hx; patient unresponsive upon RD visitation; no % meal intake available per flowsheet records; low braden score places patient at risk for skin breakdown; RD questions whether short-term EN support will be warranted.  Height: Ht Readings from Last 1 Encounters:  02/07/13 5' 2.99" (1.6 m)    Weight: Wt Readings from Last 1 Encounters:  02/08/13 392 lb 6.7 oz (178 kg)    Ideal Body Weight: 118 lb  % Ideal Body Weight: 332%  Wt Readings from Last 10 Encounters:  02/08/13 392 lb 6.7 oz (178 kg)  12/22/11 353 lb 3 oz (160.205 kg)  11/22/11 364 lb (165.109 kg)  03/24/11 365 lb (165.563 kg)  02/22/11 367 lb 6.4 oz (166.652 kg)  09/23/10 371 lb (168.284 kg)  09/03/10 376 lb (170.552 kg)  12/15/09 382 lb (173.274 kg)  12/10/09 379 lb (171.913 kg)  10/08/09 394 lb (178.717 kg)    Usual Body Weight: 364--376 lb  % Usual Body Weight: 104--107%  BMI:  Body mass index is 69.53 kg/(m^2).  Estimated Nutritional Needs: Kcal: 1650-1850 Protein: 80-90 gm Fluid: 1.6-1.8 L  Skin: Intact  Diet Order: Carb  Control  EDUCATION NEEDS: -No education needs identified at this time   Intake/Output Summary (Last 24 hours) at 02/08/13 1427 Last data filed at 02/08/13 1300  Gross per 24 hour  Intake 1344.73 ml  Output   6675 ml  Net -5330.27 ml    Labs:   Recent Labs Lab 02/07/13 0507 02/07/13 0811 02/08/13 0350  NA 140 144 144  K 5.6* 3.5 3.3*  CL 103 104 101  CO2 27 34* 34*  BUN 30* 30* 29*  CREATININE 1.15 1.22 1.09  CALCIUM 8.9 8.6 8.5  MG 1.7  --   --   PHOS 4.1  --   --   GLUCOSE 106* 80 125*    CBG (last 3)   Recent Labs  02/08/13 0003 02/08/13 0359 02/08/13 0754  GLUCAP 111* 126* 115*    Scheduled Meds: . carvedilol  12.5 mg Oral BID WC  . Chlorhexidine Gluconate Cloth  6 each Topical Q0600  . dabigatran  150 mg Oral Q12H  . furosemide  80 mg Intravenous Q6H  . hydrALAZINE  20 mg Intravenous Q6H  . insulin aspart  0-20 Units Subcutaneous TID WC  . mupirocin ointment  1 application Nasal BID  . pantoprazole (PROTONIX) IV  40 mg Intravenous Q24H  . sodium chloride  10-40 mL Intracatheter Q12H    Continuous Infusions: . dextrose 5 % 250 mL with glucagon (GLUCAGEN) 25 mg infusion 30 mL/hr at 02/08/13 0423    Past Medical History  Diagnosis Date  . Coronary artery disease  Cardiac catheterization August 06, 2009, showed nonobstructive coronary artery disease with distal diabetic vasculopathy. He had markedly elevated LV filling pressures and pulmonary venous hypertension with normal pulmonary vascular resistance suggesting he will normalize his pulmonary pressures with adequate diuresis.   . Renal insufficiency   . Sleep apnea   . Morbid obesity   . A-fib   . Chronic diastolic heart failure     Echo 8.25.2011:  Mod LVH; EF 50%; Mod LAE; RV dilation and ? dysfxn; mild RAE;   . Other primary cardiomyopathies   . Dyslipidemia   . Essential hypertension, benign   . Diabetes mellitus without mention of complication     Past Surgical History  Procedure  Laterality Date  . None      Maureen Chatters, RD, LDN Pager #: [phone] After-Hours Pager #: [phone]

## 2013-02-08 NOTE — Progress Notes (Signed)
  Echocardiogram 2D Echocardiogram has been performed.  Georgian Co 02/08/2013, 9:57 AM

## 2013-02-08 NOTE — Progress Notes (Signed)
Pt placed on CPAP at this time.  Pt is tolerating the CPAP well with a full face mask.  Pt placed on the full face mask bc the pt is a mouth breather and has been tolerated on another unit before tonight.

## 2013-02-08 NOTE — Progress Notes (Signed)
Pt transferred with RN on monitor to room 4703.  Nursing staff assisted in settling patient into room and placing patient on 4700 telemetry monitor.  Pt stable and no further needs expressed by 4700 staff assisting with admission.

## 2013-02-08 NOTE — Progress Notes (Signed)
PULMONARY  / CRITICAL CARE MEDICINE  Name: [name] MRN: [id number] DOB: 01/08/1951    ADMISSION DATE:  02/06/2013 CONSULTATION DATE:  02/06/2013  REFERRING MD :  EDP  CHIEF COMPLAINT:  Acute encephalopathy  BRIEF PATIENT DESCRIPTION: 62 yo morbidly obese with OSA/OHS (not on positive pressure therapy) sent form NH after found being "less responsive then usual".  In ED:  Placed on BiPAP, noted to be bradycardic without hypotension.  SIGNIFICANT EVENTS / STUDIES:  2/18  Admitted with acute encephalopathy, placed on BiPAP  LINES / TUBES: Foley 2/18 >>> DO NOT REMOVE (Placed by Urology)  CULTURES: MRSA PCR 2/18: pos BCx2 2/18>>>  ANTIBIOTICS: None   Brief summary:  62 yo morbidly obese with OSA/OHS (not on positive pressure therapy) sent form NH after found being "less responsive then usual".  In ED:  Placed on BiPAP, noted to be bradycardic without hypotension.   INTERVAL HISTORY: More alert, HR better. CV stable.Marland Kitchen     VITAL SIGNS: Temp:  [97.4 F (36.3 C)-97.6 F (36.4 C)] 97.6 F (36.4 C) (02/20 0757) Pulse Rate:  [26-89] 89 (02/20 0800) Resp:  [6-20] 20 (02/20 0800) BP: (111-187)/(64-93) 177/87 mmHg (02/19 1907) SpO2:  [91 %-100 %] 92 % (02/20 0800) Arterial Line BP: (150-183)/(77-96) 153/87 mmHg (02/20 0800) FiO2 (%):  [40 %] 40 % (02/19 2145) Weight:  [178 kg (392 lb 6.7 oz)] 178 kg (392 lb 6.7 oz) (02/20 0500)     VENTILATOR SETTINGS: Vent Mode:  [-]  FiO2 (%):  [40 %] 40 % On [place] oxygen INTAKE / OUTPUT: Intake/Output     02/19 0701 - 02/20 0700 02/20 0701 - 02/21 0700   I.V. (mL/kg) 1112.7 (6.3) 42 (0.2)   IV Piggyback 250 50   Total Intake(mL/kg) 1362.7 (7.7) 92 (0.5)   Urine (mL/kg/hr) 8810 (2.1) 150 (0.5)   Stool 2 (0)    Total Output 8812 150   Net -7449.3 -58          PHYSICAL EXAMINATION: General: alert no distress Neuro: oriented. No focal deficits HEENT:  PERRL Cardiovascular:  Regular, bradycardic Lungs:  Bilateral diminished air  entry, no w/r/r Abdomen:  Obese, nontender Musculoskeletal:  Moves all extremities, anasarca Skin:  Intact  LABS:  Recent Labs Lab 02/06/13 1826  02/06/13 1830 02/07/13 0507 02/07/13 0811 02/07/13 0945 02/07/13 1237 02/08/13 0350  HGB  --   --  14.0 15.4  --   --   --  13.6  WBC  --   --  4.5 8.9  --   --   --  5.5  PLT  --   --  191 178  --   --   --  166  NA  --   < > 142 140 144  --   --  144  K  --   < > 4.2 5.6* 3.5  --   --  3.3*  CL  --   < > 103 103 104  --   --  101  CO2  --   < > 30 27 34*  --   --  34*  GLUCOSE  --   < > 111* 106* 80  --   --  125*  BUN  --   < > 28* 30* 30*  --   --  29*  CREATININE  --   < > 1.30 1.15 1.22  --   --  1.09  CALCIUM  --   < > 9.1 8.9 8.6  --   --  8.5  MG  --   --   --  1.7  --   --   --   --   PHOS  --   --   --  4.1  --   --   --   --   AST  --   --  19  --   --   --   --   --   ALT  --   --  9  --   --   --   --   --   ALKPHOS  --   --  61  --   --   --   --   --   BILITOT  --   --  0.5  --   --   --   --   --   PROT  --   --  7.9  --   --   --   --   --   ALBUMIN  --   --  2.6*  --   --   --   --   --   LATICACIDVEN  --   --  1.9  --   --   --   --   --   PROBNP  --   --  15989.0*  --   --   --   --   --   PHART 7.363  --   --   --   --  7.319* 7.368  --   PCO2ART 55.1*  --   --   --   --  66.8* 59.4*  --   PO2ART 82.0  --   --   --   --  94.0 183.0*  --   < > = values in this interval not displayed.  Recent Labs Lab 02/07/13 1612 02/07/13 2009 02/08/13 0003 02/08/13 0359 02/08/13 0754  GLUCAP 97 103* 111* 126* 115*   CXR:  2/20 >>> less Pulmonary edema and small right effusion  ASSESSMENT / PLAN:  PULMONARY A:  Severe OSA/OHS, not on positive pressure therapy.  Chronic respiratory failure. P:   Gaol SpO2>92, pH>7.30 BiPAP, QHS Bronchodilators PRN Diurese further   CARDIOVASCULAR A:  Bradycardia in setting of Coreg, Clonidine and Digoxin (normal level on admission).  Hemodynamically stable.  Exacerbation  of chronic diastolic CHF. H/o AF, CAD, dyslipidemia.  HR NOW improved 2/20 P:  Stop glucagon resum coreg D/c digoxin permanently Hold clonidene RENAL A:  Chronic renal failure.  Anasarca. Hyperkalemia P:   Trend BMP No IVF Lasix 80 q6h   GASTROINTESTINAL A:  No active issues. P:   Resume diet  HEMATOLOGIC A:  On Pradaxa preadmission for AF. P:  Resume pradaxa and d/c heparin drip  INFECTIOUS A:  No evidence of acute infection. P:   No interventions required  ENDOCRINE  A:  Morbid obesity. DM2, insulin dependent, controlled. P:   SSI NEUROLOGIC A:  Acute encephalopathy, likely due to severe untreated OSA, suspect not too different from baseline. Now improved  P:   Monitor, no head ct needed   TODAY'S SUMMARY: Morbidly obese with severe untreated OSA with acute / subacute encephalopathy.  Bradycardia on Coreg, Digoxin and Clonidine, without hypotension.  Diastolic CHF, anasarca.  Difficult Foley.  Now improved. Resume coreg, cont diuresis, tfr to tele bed   I have personally obtained a history, examined the patient, evaluated laboratory and imaging results, formulated the assessment and plan and placed orders.  CRITICAL CARE:  The patient is critically ill with multiple organ systems failure and requires high complexity decision making for assessment and support, frequent evaluation and titration of therapies, application of advanced monitoring technologies and extensive interpretation of multiple databases. Critical Care Time devoted to patient care services described in this note is 35  minutes.   Shan Levans, MD Beeper  [phone]  Cell  [phone]  If no response or cell goes to voicemail, call beeper [phone]  Pulmonary and Critical Care Medicine [facility]   02/08/2013, 8:35 AM

## 2013-02-08 NOTE — Progress Notes (Signed)
eLink Physician-Brief Progress Note Patient Name: [name] DOB: 1951-11-09 MRN: [id number]  Date of Service  02/08/2013   HPI/Events of Note  Hypokalemia   eICU Interventions  Potassium replaced   Intervention Category Minor Interventions: Electrolytes abnormality - evaluation and management  DETERDING,ELIZABETH 02/08/2013, 4:44 AM

## 2013-02-08 NOTE — Progress Notes (Signed)
ANTICOAGULATION CONSULT NOTE - Follow Up Consult  Pharmacy Consult for heparin Indication: atrial fibrillation  Labs:  Recent Labs  02/06/13 1830 02/07/13 0507 02/07/13 0811 02/08/13 0350  HGB 14.0 15.4  --  13.6  HCT 43.2 46.9  --  40.8  PLT 191 178  --  166  HEPARINUNFRC  --   --  0.68 1.53*  CREATININE 1.30 1.15 1.22  --     Assessment: 62yo male now supratherapeutic on heparin after one level at goal; per RN lab drawn from A-line, heparin running in central line.  Goal of Therapy:  Heparin level 0.3-0.7 units/ml   Plan:  Will hold gtt x1hr then resume at lower rate of 1200 units/hr and check level in 6hr.  Colleen Can PharmD BCPS 02/08/2013,4:29 AM

## 2013-02-08 NOTE — Progress Notes (Signed)
SUBJECTIVE: The patient is doing well today.  Mental status is improving.  SOB appears to be stable.  At this time, he denies chest pain or any new concerns.  Marland Kitchen antiseptic oral rinse  15 mL Mouth Rinse q12n4p  . chlorhexidine  15 mL Mouth Rinse BID  . Chlorhexidine Gluconate Cloth  6 each Topical Q0600  . furosemide  80 mg Intravenous Q6H  . hydrALAZINE  20 mg Intravenous Q6H  . insulin aspart  2-6 Units Subcutaneous Q4H  . mupirocin ointment  1 application Nasal BID  . pantoprazole (PROTONIX) IV  40 mg Intravenous Q24H  . potassium chloride  10 mEq Intravenous Q1 Hr x 4  . sodium chloride  10-40 mL Intracatheter Q12H   . dextrose 5 % 250 mL with glucagon (GLUCAGEN) 25 mg infusion 30 mL/hr at 02/08/13 0423  . heparin 1,200 Units/hr (02/08/13 0541)    OBJECTIVE: Physical Exam: Filed Vitals:   02/08/13 0600 02/08/13 0700 02/08/13 0757 02/08/13 0800  BP:      Pulse: 35 42  89  Temp:   97.6 F (36.4 C)   TempSrc:   Oral   Resp: 19 19  20   Height:      Weight:      SpO2: 94% 97%  92%    Intake/Output Summary (Last 24 hours) at 02/08/13 0981 Last data filed at 02/08/13 0800  Gross per 24 hour  Intake 1408.73 ml  Output   8486 ml  Net -7077.27 ml    Telemetry reveals sinus rhythm, Vrates presently 70s  GEN- The patient is morbidly obese and chronically appearing,  Sleeping but rouses Head- normocephalic, atraumatic Eyes-  Sclera clear, conjunctiva pink Ears- hearing intact Oropharynx- clear Neck- supple, L CVL in place Lungs- decreased throughout Heart- irregular rate and rhythm, distant GI- soft, NT, ND, + BS Extremities- no clubbing, cyanosis, + dependant edema Psych- euthymic mood, full affect Neuro- strength and sensation are intact  LABS: Basic Metabolic Panel:  Recent Labs  19/14/78 0507 02/07/13 0811 02/08/13 0350  NA 140 144 144  K 5.6* 3.5 3.3*  CL 103 104 101  CO2 27 34* 34*  GLUCOSE 106* 80 125*  BUN 30* 30* 29*  CREATININE 1.15 1.22 1.09    CALCIUM 8.9 8.6 8.5  MG 1.7  --   --   PHOS 4.1  --   --    Liver Function Tests:  Recent Labs  02/06/13 1830  AST 19  ALT 9  ALKPHOS 61  BILITOT 0.5  PROT 7.9  ALBUMIN 2.6*   No results found for this basename: LIPASE, AMYLASE,  in the last 72 hours CBC:  Recent Labs  02/06/13 1830 02/07/13 0507 02/08/13 0350  WBC 4.5 8.9 5.5  NEUTROABS 2.8  --   --   HGB 14.0 15.4 13.6  HCT 43.2 46.9 40.8  MCV 87.8 88.7 85.9  PLT 191 178 166    RADIOLOGY: Dg Chest Port 1 View  02/08/2013  *RADIOLOGY REPORT*  Clinical Data: Pulmonary edema.  PORTABLE CHEST - 1 VIEW  Comparison: 02/07/2013  Findings: Portable view of the chest was obtained.  Jugular central venous catheter in the upper SVC region.  Again noted is marked enlargement of the cardiac silhouette. Diffuse parenchymal lung densities are suggestive for edema.  Cannot exclude pleural effusions, particularly on the right side.  IMPRESSION: Minimal change from the previous examination. There is marked enlargement of the cardiac silhouette and evidence for pulmonary edema.  Cannot exclude pleural effusions.  Original Report Authenticated By: Richarda Overlie, M.D.    Dg Chest Port 1 View  02/07/2013  *RADIOLOGY REPORT*  Clinical Data: Central line placement.  PORTABLE CHEST - 1 VIEW  Comparison: 02/06/2013.  Findings: The left IJ central venous catheter the tip is in the mid SVC at the level of the carina.  No complicating features.  The heart remains enlarged and there is persistent vascular congestion and pulmonary edema but slight improved lung aeration.  No large pleural effusion.  IMPRESSION:  1.  Left IJ catheter in good position without complicating features. 2.  Persistent cardiac enlargement and pulmonary edema but slight interval improved lung aeration.   Original Report Authenticated By: Rudie Meyer, M.D.    Dg Chest Port 1 View  02/06/2013  *RADIOLOGY REPORT*  Clinical Data: CHF  PORTABLE CHEST - 1 VIEW  Comparison: Prior chest  x-ray 12/22/2011  Findings: Increased pulmonary vascular congestion now with mild interstitial edema.  Marked enlargement of the cardiopericardial silhouette similar to prior.  Enlarged at venous vascular stripe along the right the tracheal contour.  Small right pleural effusion.  No acute osseous abnormality.  IMPRESSION:  1.  Pulmonary edema and small right effusion consistent with CHF. 2.  Stable cardiomegaly.   Original Report Authenticated By: Malachy Moan, M.D.     ASSESSMENT AND PLAN:  Active Problems:   DIABETES MELLITUS, TYPE II   Morbid obesity   Essential hypertension, benign   CORONARY ARTERY DISEASE, S/P PTCA   CARDIOMYOPATHY, PRIMARY   Atrial fibrillation   SLEEP APNEA   Chronic respiratory failure   Bradycardia   Acute on chronic diastolic CHF (congestive heart failure)   Acute and chronic respiratory failure (acute-on-chronic)   Hyperkalemia  1. afib- he appears to have permanent afib Would stop digoxin long term Coreg remains on hold.  We can restart coreg 12.5mg  BID once taking po and follow On heparin gtt Restart pradaxa and stop heparin gtt once taking pos  2. Bradycardia- improving off of digoxin/ coreg Stop digoxin long term Decrease coreg 12.5mg  BID  3. Acute on chronic diastolic dysfunction Continue current IV diuresis and follow closely Replete K Will need echo (last 2011)  4. HTN Restart coreg when able  5. OSA/ respiratory failure/ AMS- per PCCM  Could go to telemetry from CV standpoint once respiratory status is stable (will defer to PCCM)    Hillis Range, MD 02/08/2013 8:22 AM

## 2013-02-08 NOTE — Progress Notes (Signed)
Pt placed on auto titrate and tolerating well at this time.

## 2013-02-09 ENCOUNTER — Inpatient Hospital Stay (HOSPITAL_COMMUNITY): Payer: Medicaid Other

## 2013-02-09 DIAGNOSIS — I10 Essential (primary) hypertension: Secondary | ICD-10-CM

## 2013-02-09 LAB — CBC
HCT: 43.1 % (ref 39.0–52.0)
Hemoglobin: 14 g/dL (ref 13.0–17.0)
RDW: 18 % — ABNORMAL HIGH (ref 11.5–15.5)
WBC: 5.6 10*3/uL (ref 4.0–10.5)

## 2013-02-09 LAB — GLUCOSE, CAPILLARY
Glucose-Capillary: 190 mg/dL — ABNORMAL HIGH (ref 70–99)
Glucose-Capillary: 176 mg/dL — ABNORMAL HIGH (ref 70–99)

## 2013-02-09 LAB — BASIC METABOLIC PANEL
Chloride: 97 mEq/L (ref 96–112)
GFR calc Af Amer: 80 mL/min — ABNORMAL LOW (ref >90)
GFR calc non Af Amer: 69 mL/min — ABNORMAL LOW (ref >90)
Potassium: 3.4 mEq/L — ABNORMAL LOW (ref 3.5–5.1)

## 2013-02-09 MED ORDER — MAGNESIUM SULFATE 40 MG/ML IJ SOLN
4.0000 g | Freq: Once | INTRAMUSCULAR | Status: AC
  Administered 2013-02-09: 4 g via INTRAVENOUS
  Filled 2013-02-09: qty 100

## 2013-02-09 MED ORDER — POTASSIUM CHLORIDE CRYS ER 20 MEQ PO TBCR
40.0000 meq | EXTENDED_RELEASE_TABLET | Freq: Two times a day (BID) | ORAL | Status: DC
  Administered 2013-02-09 (×2): 40 meq via ORAL
  Filled 2013-02-09 (×4): qty 2

## 2013-02-09 MED ORDER — LISINOPRIL 10 MG PO TABS
10.0000 mg | ORAL_TABLET | Freq: Every day | ORAL | Status: DC
  Administered 2013-02-09: 10 mg via ORAL
  Filled 2013-02-09 (×2): qty 1

## 2013-02-09 MED ORDER — PANTOPRAZOLE SODIUM 40 MG PO TBEC
40.0000 mg | DELAYED_RELEASE_TABLET | Freq: Every day | ORAL | Status: DC
  Administered 2013-02-09 – 2013-02-15 (×7): 40 mg via ORAL
  Filled 2013-02-09 (×7): qty 1

## 2013-02-09 MED ORDER — MAGNESIUM SULFATE 40 MG/ML IJ SOLN
2.0000 g | Freq: Once | INTRAMUSCULAR | Status: AC
  Administered 2013-02-09: 2 g via INTRAVENOUS
  Filled 2013-02-09: qty 50

## 2013-02-09 NOTE — Progress Notes (Signed)
Paged by Arlys John CMT that pt had some RVR .  Pt asymptomatic and BP136/75, P90.  Notified primary nurse Tiffany.  Will continue to monitor.  Jericho Alcorn,RN.

## 2013-02-09 NOTE — Progress Notes (Signed)
SUBJECTIVE: The patient is doing well today.  Mental status is much improved.  SOB appears to be stable.  At this time, he denies chest pain or any new concerns.  . carvedilol  12.5 mg Oral BID WC  . Chlorhexidine Gluconate Cloth  6 each Topical Q0600  . dabigatran  150 mg Oral Q12H  . hydrALAZINE  20 mg Intravenous Q6H  . insulin aspart  0-20 Units Subcutaneous TID WC  . mupirocin ointment  1 application Nasal BID  . pantoprazole (PROTONIX) IV  40 mg Intravenous Q24H  . sodium chloride  10-40 mL Intracatheter Q12H      OBJECTIVE: Physical Exam: Filed Vitals:   02/08/13 1157 02/08/13 1435 02/08/13 2111 02/09/13 0508  BP: 141/84 118/73 178/84 191/91  Pulse: 91 64 82 69  Temp:  97.6 F (36.4 C) 98.2 F (36.8 C) 97.4 F (36.3 C)  TempSrc:  Axillary Oral Oral  Resp:  18 20 20   Height:      Weight:      SpO2: 89% 88% 89% 94%    Intake/Output Summary (Last 24 hours) at 02/09/13 0809 Last data filed at 02/09/13 4098  Gross per 24 hour  Intake    332 ml  Output   3125 ml  Net  -2793 ml    Telemetry reveals afib, very frequent ventricular ectopy  GEN- The patient is morbidly obese and chronically appearing,  Sleeping but rouses Head- normocephalic, atraumatic Eyes-  Sclera clear, conjunctiva pink Ears- hearing intact Oropharynx- clear Neck- supple, L CVL in place Lungs- decreased throughout Heart- irregular rate and rhythm, distant GI- soft, NT, ND, + BS Extremities- no clubbing, cyanosis, + dependant edema Psych- euthymic mood, full affect Neuro- strength and sensation are intact  LABS: Basic Metabolic Panel:  Recent Labs  11/91/47 0507  02/08/13 2150 02/09/13 0500  NA 140  < > 141 141  K 5.6*  < > 3.5 3.4*  CL 103  < > 97 97  CO2 27  < > 33* 34*  GLUCOSE 106*  < > 76 115*  BUN 30*  < > 28* 29*  CREATININE 1.15  < > 1.12 1.13  CALCIUM 8.9  < > 8.6 8.9  MG 1.7  --  1.5 1.3*  PHOS 4.1  --   --   --   < > = values in this interval not displayed. Liver  Function Tests:  Recent Labs  02/06/13 1830  AST 19  ALT 9  ALKPHOS 61  BILITOT 0.5  PROT 7.9  ALBUMIN 2.6*   No results found for this basename: LIPASE, AMYLASE,  in the last 72 hours CBC:  Recent Labs  02/06/13 1830  02/08/13 0350 02/09/13 0500  WBC 4.5  < > 5.5 5.6  NEUTROABS 2.8  --   --   --   HGB 14.0  < > 13.6 14.0  HCT 43.2  < > 40.8 43.1  MCV 87.8  < > 85.9 86.2  PLT 191  < > 166 148*  < > = values in this interval not displayed.  RADIOLOGY: Dg Chest Port 1 View  02/08/2013  *RADIOLOGY REPORT*  Clinical Data: Pulmonary edema.  PORTABLE CHEST - 1 VIEW  Comparison: 02/07/2013  Findings: Portable view of the chest was obtained.  Jugular central venous catheter in the upper SVC region.  Again noted is marked enlargement of the cardiac silhouette. Diffuse parenchymal lung densities are suggestive for edema.  Cannot exclude pleural effusions, particularly on the right side.  IMPRESSION: Minimal change from the previous examination. There is marked enlargement of the cardiac silhouette and evidence for pulmonary edema.  Cannot exclude pleural effusions.   Original Report Authenticated By: Richarda Overlie, M.D.    Dg Chest Port 1 View  02/07/2013  *RADIOLOGY REPORT*  Clinical Data: Central line placement.  PORTABLE CHEST - 1 VIEW  Comparison: 02/06/2013.  Findings: The left IJ central venous catheter the tip is in the mid SVC at the level of the carina.  No complicating features.  The heart remains enlarged and there is persistent vascular congestion and pulmonary edema but slight improved lung aeration.  No large pleural effusion.  IMPRESSION:  1.  Left IJ catheter in good position without complicating features. 2.  Persistent cardiac enlargement and pulmonary edema but slight interval improved lung aeration.   Original Report Authenticated By: Rudie Meyer, M.D.    Dg Chest Port 1 View  02/06/2013  *RADIOLOGY REPORT*  Clinical Data: CHF  PORTABLE CHEST - 1 VIEW  Comparison: Prior  chest x-ray 12/22/2011  Findings: Increased pulmonary vascular congestion now with mild interstitial edema.  Marked enlargement of the cardiopericardial silhouette similar to prior.  Enlarged at venous vascular stripe along the right the tracheal contour.  Small right pleural effusion.  No acute osseous abnormality.  IMPRESSION:  1.  Pulmonary edema and small right effusion consistent with CHF. 2.  Stable cardiomegaly.   Original Report Authenticated By: Malachy Moan, M.D.     ASSESSMENT AND PLAN:  Active Problems:   DIABETES MELLITUS, TYPE II   Morbid obesity   Essential hypertension, benign   CORONARY ARTERY DISEASE, S/P PTCA   CARDIOMYOPATHY, PRIMARY   Atrial fibrillation   SLEEP APNEA   Chronic respiratory failure   Bradycardia   Acute on chronic diastolic CHF (congestive heart failure)   Acute and chronic respiratory failure (acute-on-chronic)   Hyperkalemia  1. afib- he appears to have permanent afib Would stop digoxin long term Back on pradaxa and coreg  2. Bradycardia- improving off of digoxin  Stop digoxin long term Continue coreg 12.5mg  BID  3. Acute on chronic diastolic dysfunction/ severe pulmonary hypertension with right heart failure Continue current IV diuresis and follow closely Replete K and MG Echo reviewed Treat underlying lung disease Weight loss is necessary to adequately treat this patient  4. HTN Restart lisinopril and titrate  Consider spironolactone if BP remains elevated once lisinopril is optimized  5. OSA/ respiratory failure/ AMS- per PCCM   Hillis Range, MD 02/09/2013 8:09 AM

## 2013-02-09 NOTE — Progress Notes (Signed)
PULMONARY  / CRITICAL CARE MEDICINE  Name: [name] MRN: [id number] DOB: May 05, 1951    ADMISSION DATE:  02/06/2013 CONSULTATION DATE:  02/06/2013  REFERRING MD :  EDP  CHIEF COMPLAINT:  Acute encephalopathy  BRIEF PATIENT DESCRIPTION: 62 yo morbidly obese with OSA/OHS (not on positive pressure therapy) sent form NH after found being "less responsive then usual".  In ED:  Placed on BiPAP, noted to be bradycardic without hypotension.  SIGNIFICANT EVENTS / STUDIES:  2/18  Admitted with acute encephalopathy, placed on BiPAP  LINES / TUBES: Foley 2/18 >>> DO NOT REMOVE (Placed by Urology)  CULTURES: MRSA PCR 2/18: pos BCx2 2/18>>>  ANTIBIOTICS: None   Brief summary:  62 yo morbidly obese with OSA/OHS (not on positive pressure therapy) sent form NH after found being "less responsive then usual".  In ED:  Placed on BiPAP, noted to be bradycardic without hypotension.   INTERVAL HISTORY: More alert, HR better. CV stable.Marland Kitchen     VITAL SIGNS: Temp:  [97.4 F (36.3 C)-98.2 F (36.8 C)] 97.4 F (36.3 C) (02/21 0508) Pulse Rate:  [64-91] 69 (02/21 0508) Resp:  [18-20] 20 (02/21 0508) BP: (118-191)/(73-91) 191/91 mmHg (02/21 0508) SpO2:  [88 %-97 %] 94 % (02/21 0508) FiO2 (%):  [21 %] 21 % (02/20 2146)     VENTILATOR SETTINGS: Vent Mode:  [-]  FiO2 (%):  [21 %] 21 % On [place] oxygen INTAKE / OUTPUT: Intake/Output     02/20 0701 - 02/21 0700 02/21 0701 - 02/22 0700   P.O. 120 360   I.V. (mL/kg) 204 (1.1)    IV Piggyback 100    Total Intake(mL/kg) 424 (2.4) 360 (2)   Urine (mL/kg/hr) 3275 (0.8)    Stool     Total Output 3275     Net -2851 +360        Stool Occurrence 1 x      PHYSICAL EXAMINATION: General: alert no distress Neuro: oriented. No focal deficits HEENT:  PERRL Cardiovascular:  Regular, bradycardic Lungs:  Bilateral diminished air entry, no w/r/r Abdomen:  Obese, nontender Musculoskeletal:  Moves all extremities, anasarca Skin:   Intact  LABS:  Recent Labs Lab 02/06/13 1826  02/06/13 1830 02/07/13 0507  02/07/13 0945 02/07/13 1237 02/08/13 0350 02/08/13 2150 02/09/13 0500  HGB  --   < > 14.0 15.4  --   --   --  13.6  --  14.0  WBC  --   < > 4.5 8.9  --   --   --  5.5  --  5.6  PLT  --   < > 191 178  --   --   --  166  --  148*  NA  --   < > 142 140  < >  --   --  144 141 141  K  --   < > 4.2 5.6*  < >  --   --  3.3* 3.5 3.4*  CL  --   < > 103 103  < >  --   --  101 97 97  CO2  --   < > 30 27  < >  --   --  34* 33* 34*  GLUCOSE  --   < > 111* 106*  < >  --   --  125* 76 115*  BUN  --   < > 28* 30*  < >  --   --  29* 28* 29*  CREATININE  --   < >  1.30 1.15  < >  --   --  1.09 1.12 1.13  CALCIUM  --   < > 9.1 8.9  < >  --   --  8.5 8.6 8.9  MG  --   --   --  1.7  --   --   --   --  1.5 1.3*  PHOS  --   --   --  4.1  --   --   --   --   --   --   AST  --   --  19  --   --   --   --   --   --   --   ALT  --   --  9  --   --   --   --   --   --   --   ALKPHOS  --   --  61  --   --   --   --   --   --   --   BILITOT  --   --  0.5  --   --   --   --   --   --   --   PROT  --   --  7.9  --   --   --   --   --   --   --   ALBUMIN  --   --  2.6*  --   --   --   --   --   --   --   LATICACIDVEN  --   --  1.9  --   --   --   --   --   --   --   PROBNP  --   --  15989.0*  --   --   --   --   --   --   --   PHART 7.363  --   --   --   --  7.319* 7.368  --   --   --   PCO2ART 55.1*  --   --   --   --  66.8* 59.4*  --   --   --   PO2ART 82.0  --   --   --   --  94.0 183.0*  --   --   --   < > = values in this interval not displayed.  Recent Labs Lab 02/08/13 0359 02/08/13 0754 02/08/13 1623 02/08/13 2107 02/09/13 0621  GLUCAP 126* 115* 111* 71 115*   CXR:  2/20 >>> less Pulmonary edema and small right effusion  ASSESSMENT / PLAN:  PULMONARY A:  Severe OSA/OHS, not on positive pressure therapy.  Chronic respiratory failure. P:   Gaol SpO2>92, pH>7.30 BiPAP, QHS Bronchodilators PRN Diurese as tolerated    CARDIOVASCULAR A:  Bradycardia in setting of Coreg, Clonidine and Digoxin (normal level on admission).  Hemodynamically stable.  Exacerbation of chronic diastolic CHF. H/o AF, CAD, dyslipidemia.  HR NOW improved 2/20 P:  Stopped glucagon resum coreg D/c digoxin permanently Hold clonidene RENAL Lab Results  Component Value Date   CREATININE 1.13 02/09/2013   CREATININE 1.12 02/08/2013   CREATININE 1.09 02/08/2013    A:  Chronic renal failure.  Anasarca. Hyperkalemia P:   Trend BMP No IVF Lasix 80 q6h   GASTROINTESTINAL A:  No active issues. P:   Resume diet  HEMATOLOGIC A:  On Pradaxa preadmission for AF. P:  Resumed pradaxa and d/c heparin drip  INFECTIOUS A:  No evidence of acute infection. P:   No interventions required  ENDOCRINE  CBG (last 3)   Recent Labs  02/08/13 1623 02/08/13 2107 02/09/13 0621  GLUCAP 111* 71 115*      A:  Morbid obesity. DM2, insulin dependent, controlled. P:   SSI NEUROLOGIC A:  Acute encephalopathy, likely due to severe untreated OSA, suspect not too different from baseline. Now improved  P:   Monitor, no head ct needed   TODAY'S SUMMARY: Morbidly obese with severe untreated OSA with acute / subacute encephalopathy.  Bradycardia on Coreg, Digoxin and Clonidine, without hypotension.  Diastolic CHF, anasarca.  Difficult Foley.  Now improved. Resumed coreg, cont diuresis, tfr to tele bed 2-20 and to [facility] service 2-22, Dr Sharon Seller aware  Brett Canales Minor ACNP Adolph Pollack PCCM Pager [phone] till 3 pm If no answer page [phone] 02/09/2013, 10:37 AM  I have interviewed and examined the patient and reviewed the database. I have formulated the assessment and plan as reflected in the note above with amendments made by me.   Billy Fischer, MD;  PCCM service; Mobile [phone]

## 2013-02-09 NOTE — Clinical Social Work Psychosocial (Addendum)
    Clinical Social Work Department BRIEF PSYCHOSOCIAL ASSESSMENT 02/09/2013  Patient:  [name], [name]     Account Number:  [id number]     Admit date:  02/06/2013  Clinical Social Worker:  Lourdes Sledge  Date/Time:  02/09/2013 02:20 PM  Referred by:  RN  Date Referred:  02/09/2013 Referred for  SNF Placement   Other Referral:   Interview type:  Other - See comment Other interview type:   CSW completed assessment with pt HCPOA [name] [phone]    PSYCHOSOCIAL DATA Living Status:  FACILITY Admitted from facility:  [facility]  Level of care:  Skilled Nursing Facility Primary support name:  [name] [phone] Primary support relationship to patient:  FRIEND Degree of support available:   [name] [phone] states she is pt main contact/HCPOA    CURRENT CONCERNS Current Concerns  Post-Acute Placement   Other Concerns:    SOCIAL WORK ASSESSMENT / PLAN CSW informed that pt admitted from a SNF.    CSW unable to complete assessment with pt who presents only oriented to person. CSW contacted pt friend Mr. [name] who informed CSW that pt main contact & HCPOA is [name] [phone]  . CSW contacted [name] who confirmed pt was admitted from [facility] and prior was at [facility]. [name] stated she is HCPOA, faxed over documents to the nursing home as well as to the ICU pt was previously at. [name] declined any other questions and appreciated CSW call.    CSW has left a message for [name] confirming if pt can return at dc.   Assessment/plan status:  Psychosocial Support/Ongoing Assessment of Needs Other assessment/ plan:   Information/referral to community resources:   No resources needed at this time.    PATIENT'S/FAMILY'S RESPONSE TO PLAN OF CARE: Assessment unable to be completed with pt who presents only oriented to person. CSW completed assessment with pt [name] [phone] who confirmed the plan is for  pt to return to [facility] at dc.

## 2013-02-09 NOTE — Progress Notes (Signed)
Pt noted having a 9 beat run of Vtach. Pt asymptomatic, denies chest pain and discomfort. MD notified, will continue to monitor.

## 2013-02-09 NOTE — Progress Notes (Signed)
CVC site assessed and found that the dressing is pulled off a third of the way and the line pulled out a third of the way. IVF stopped and the nurse informed that the line is more than likely not in the correct place and we need to get an order to remove the central line.  Consuello Masse

## 2013-02-10 DIAGNOSIS — G9341 Metabolic encephalopathy: Secondary | ICD-10-CM | POA: Diagnosis present

## 2013-02-10 LAB — GLUCOSE, CAPILLARY
Glucose-Capillary: 131 mg/dL — ABNORMAL HIGH (ref 70–99)
Glucose-Capillary: 149 mg/dL — ABNORMAL HIGH (ref 70–99)

## 2013-02-10 LAB — BASIC METABOLIC PANEL
BUN: 30 mg/dL — ABNORMAL HIGH (ref 6–23)
Chloride: 96 mEq/L (ref 96–112)
Creatinine, Ser: 1.18 mg/dL (ref 0.50–1.35)
GFR calc Af Amer: 76 mL/min — ABNORMAL LOW (ref >90)
GFR calc non Af Amer: 65 mL/min — ABNORMAL LOW (ref >90)
Potassium: 3.9 mEq/L (ref 3.5–5.1)

## 2013-02-10 LAB — MAGNESIUM
Magnesium: 2.2 mg/dL (ref 1.5–2.5)
Magnesium: 2.2 mg/dL (ref 1.5–2.5)

## 2013-02-10 MED ORDER — LISINOPRIL 20 MG PO TABS
20.0000 mg | ORAL_TABLET | Freq: Every day | ORAL | Status: DC
  Administered 2013-02-10 – 2013-02-11 (×2): 20 mg via ORAL
  Filled 2013-02-10 (×2): qty 1

## 2013-02-10 MED ORDER — FUROSEMIDE 10 MG/ML IJ SOLN
40.0000 mg | Freq: Once | INTRAMUSCULAR | Status: AC
  Administered 2013-02-10: 40 mg via INTRAVENOUS
  Filled 2013-02-10: qty 4

## 2013-02-10 MED ORDER — FUROSEMIDE 10 MG/ML IJ SOLN
8.0000 mg/h | INTRAVENOUS | Status: DC
  Administered 2013-02-10: 8 mg/h via INTRAVENOUS
  Administered 2013-02-11: 10 mg/h via INTRAVENOUS
  Administered 2013-02-11 – 2013-02-13 (×3): 12 mg/h via INTRAVENOUS
  Administered 2013-02-14: 8 mg/h via INTRAVENOUS
  Filled 2013-02-10 (×6): qty 25

## 2013-02-10 MED ORDER — POTASSIUM CHLORIDE CRYS ER 20 MEQ PO TBCR
40.0000 meq | EXTENDED_RELEASE_TABLET | Freq: Two times a day (BID) | ORAL | Status: AC
  Administered 2013-02-10 (×2): 40 meq via ORAL
  Filled 2013-02-10: qty 2

## 2013-02-10 NOTE — Progress Notes (Signed)
Pt. Alarmed 6 beat run v-tach.  Pt. In bed eating dinner/asymptomatic.  BP 136/75. HR in 70s.  Will continue to monitor patient.

## 2013-02-10 NOTE — Progress Notes (Signed)
SUBJECTIVE: The patient is doing well today.   At this time, he denies chest pain, SOB or any new concerns.  . carvedilol  12.5 mg Oral BID WC  . Chlorhexidine Gluconate Cloth  6 each Topical Q0600  . dabigatran  150 mg Oral Q12H  . furosemide  40 mg Intravenous Once  . insulin aspart  0-20 Units Subcutaneous TID WC  . lisinopril  20 mg Oral Daily  . mupirocin ointment  1 application Nasal BID  . pantoprazole  40 mg Oral Daily  . potassium chloride  40 mEq Oral BID  . sodium chloride  10-40 mL Intracatheter Q12H   . furosemide (LASIX) infusion      OBJECTIVE: Physical Exam: Filed Vitals:   02/09/13 1328 02/09/13 2100 02/09/13 2310 02/10/13 0354  BP: 149/82 171/70  132/83  Pulse: 96 105 91 72  Temp: 97.4 F (36.3 C) 97.8 F (36.6 C)  97.4 F (36.3 C)  TempSrc: Oral Oral  Oral  Resp: 20 18 17 18   Height:      Weight:    378 lb 1.6 oz (171.505 kg)  SpO2: 92% 92% 93% 92%    Intake/Output Summary (Last 24 hours) at 02/10/13 1120 Last data filed at 02/10/13 0800  Gross per 24 hour  Intake    960 ml  Output    700 ml  Net    260 ml    Telemetry reveals afib, very frequent ventricular ectopy  GEN- The patient is morbidly obese and chronically appearing,  Sleeping but rouses Head- normocephalic, atraumatic Eyes-  Sclera clear, conjunctiva pink Ears- hearing intact Oropharynx- clear Neck- supple, L CVL in place Lungs- decreased throughout Heart- irregular rate and rhythm, distant GI- soft, NT, ND, + BS Extremities- no clubbing, cyanosis, + dependant edema Psych- euthymic mood, full affect Neuro- strength and sensation are intact  LABS: Basic Metabolic Panel:  Recent Labs  16/10/96 0500 02/10/13 0600 02/10/13 0808  NA 141 139  --   K 3.4* 3.9  --   CL 97 96  --   CO2 34* 36*  --   GLUCOSE 115* 130*  --   BUN 29* 30*  --   CREATININE 1.13 1.18  --   CALCIUM 8.9 9.1  --   MG 1.3* 2.2 2.2   Liver Function Tests: No results found for this basename:  AST, ALT, ALKPHOS, BILITOT, PROT, ALBUMIN,  in the last 72 hours No results found for this basename: LIPASE, AMYLASE,  in the last 72 hours CBC:  Recent Labs  02/08/13 0350 02/09/13 0500  WBC 5.5 5.6  HGB 13.6 14.0  HCT 40.8 43.1  MCV 85.9 86.2  PLT 166 148*    RADIOLOGY: Dg Chest Port 1 View  02/08/2013  *RADIOLOGY REPORT*  Clinical Data: Pulmonary edema.  PORTABLE CHEST - 1 VIEW  Comparison: 02/07/2013  Findings: Portable view of the chest was obtained.  Jugular central venous catheter in the upper SVC region.  Again noted is marked enlargement of the cardiac silhouette. Diffuse parenchymal lung densities are suggestive for edema.  Cannot exclude pleural effusions, particularly on the right side.  IMPRESSION: Minimal change from the previous examination. There is marked enlargement of the cardiac silhouette and evidence for pulmonary edema.  Cannot exclude pleural effusions.   Original Report Authenticated By: Richarda Overlie, M.D.    Dg Chest Port 1 View  02/07/2013  *RADIOLOGY REPORT*  Clinical Data: Central line placement.  PORTABLE CHEST - 1 VIEW  Comparison: 02/06/2013.  Findings: The left IJ central venous catheter the tip is in the mid SVC at the level of the carina.  No complicating features.  The heart remains enlarged and there is persistent vascular congestion and pulmonary edema but slight improved lung aeration.  No large pleural effusion.  IMPRESSION:  1.  Left IJ catheter in good position without complicating features. 2.  Persistent cardiac enlargement and pulmonary edema but slight interval improved lung aeration.   Original Report Authenticated By: Rudie Meyer, M.D.    Dg Chest Port 1 View  02/06/2013  *RADIOLOGY REPORT*  Clinical Data: CHF  PORTABLE CHEST - 1 VIEW  Comparison: Prior chest x-ray 12/22/2011  Findings: Increased pulmonary vascular congestion now with mild interstitial edema.  Marked enlargement of the cardiopericardial silhouette similar to prior.  Enlarged at  venous vascular stripe along the right the tracheal contour.  Small right pleural effusion.  No acute osseous abnormality.  IMPRESSION:  1.  Pulmonary edema and small right effusion consistent with CHF. 2.  Stable cardiomegaly.   Original Report Authenticated By: Malachy Moan, M.D.     ASSESSMENT AND PLAN:  Principal Problem:   Encephalopathy, metabolic Active Problems:   DIABETES MELLITUS, TYPE II   Morbid obesity   Essential hypertension, benign   CORONARY ARTERY DISEASE, S/P PTCA   CARDIOMYOPATHY, PRIMARY   Atrial fibrillation   SLEEP APNEA   Chronic respiratory failure   Bradycardia   Acute on chronic diastolic CHF (congestive heart failure)   Acute and chronic respiratory failure (acute-on-chronic)   Hyperkalemia  1. afib- he appears to have permanent afib Back on pradaxa and coreg  2. Bradycardia- improving off of digoxin  Stop digoxin long term Continue coreg 12.5mg  BID  3. Acute on chronic diastolic dysfunction/ severe pulmonary hypertension with right heart failure Continue current IV diuresis and follow closely Replete K and MG Echo reviewed Treat underlying lung disease Weight loss is necessary to adequately treat this patient  4. HTN Restared lisinopril yesterday.  Plan to increase dose tomorrow if necessary. Consider spironolactone if BP remains elevated once lisinopril is optimized  5. OSA/ respiratory failure/ AMS- per PCCM  I think hes near his baseline, which is a very poor functional state.  Plans for long term management per primary team. He may require SNF.  Hillis Range, MD 02/10/2013 11:20 AM

## 2013-02-10 NOTE — Progress Notes (Signed)
[facility] PROGRESS NOTE Interim History: 62 yo morbidly obese with OSA/OHS (not on positive pressure therapy) sent form NH after found being "less responsive then usual". In ED: Placed on BiPAP, noted to be bradycardic without hypotension.  Filed Weights   02/08/13 0500 02/09/13 1102 02/10/13 0354  Weight: 178 kg (392 lb 6.7 oz) 164.202 kg (362 lb) 171.505 kg (378 lb 1.6 oz)     Assessment/Plan: Encephalopathy, metabolic - now resolved. - treated with Bipap. - most likely 2/2 to untreated severe OSA.  Acute and chronic respiratory failure/ SLEEP APNEA: - continue O2. - Bipap at night.  Atrial fibrillation/Bradycardia: - Rate controlled. - Cont Pradaxa. - Stop digoxin long term  - Continue coreg 12.5mg  BID  Acute on chronic diastolic CHF/severe pulmonary hypertension with right heart failure  - Continue current IV diuresis and follow closely, monitor electrolytes closely. - Replete K and MG as needed. - continue lisinopril and coreg. Titrate as tolerate it. - Echo: Pulmonary arteries: PA systolic pressure 82-86 mmHg, ejection fraction was 50%. Mild global hypokinesis  CORONARY ARTERY DISEASE, S/P PTCA - pradaxa currently asx.  DIABETES MELLITUS, TYPE II - continue SSI.  Essential hypertension, benign: - coreg, lisinopril  Morbid obesity: - counseling    Code Status: full Family Communication: none  Disposition Plan: home 3-4 days   Consultants:  cardiology  Procedures:  Echo 2.20.2014: Pulmonary arteries: PA systolic pressure 82-86 mmHg, ejection fraction was 50%. Mild global hypokinesis  Antibiotics:  none (indicate start date, and stop date if known)  HPI/Subjective: No complains feels better.  Objective: Filed Vitals:   02/09/13 1328 02/09/13 2100 02/09/13 2310 02/10/13 0354  BP: 149/82 171/70  132/83  Pulse: 96 105 91 72  Temp: 97.4 F (36.3 C) 97.8 F (36.6 C)  97.4 F (36.3 C)  TempSrc: Oral Oral  Oral  Resp: 20 18 17 18    Height:      Weight:    171.505 kg (378 lb 1.6 oz)  SpO2: 92% 92% 93% 92%    Intake/Output Summary (Last 24 hours) at 02/10/13 0907 Last data filed at 02/10/13 0413  Gross per 24 hour  Intake   1200 ml  Output   1200 ml  Net      0 ml   Filed Weights   02/08/13 0500 02/09/13 1102 02/10/13 0354  Weight: 178 kg (392 lb 6.7 oz) 164.202 kg (362 lb) 171.505 kg (378 lb 1.6 oz)    Exam:  General: Alert, awake, oriented x3, in no acute distress.  HEENT: No bruits, no goiter.  Heart: Regular rate and rhythm, without murmurs, rubs, gallops. Massive edema. Lungs: Good air movement, bilateral air movement.  Abdomen: Soft, nontender, nondistended, positive bowel sounds.  Neuro: Grossly intact, nonfocal.   Data Reviewed: Basic Metabolic Panel:  Recent Labs Lab 02/07/13 0507 02/07/13 0811 02/08/13 0350 02/08/13 2150 02/09/13 0500 02/10/13 0600  NA 140 144 144 141 141 139  K 5.6* 3.5 3.3* 3.5 3.4* 3.9  CL 103 104 101 97 97 96  CO2 27 34* 34* 33* 34* 36*  GLUCOSE 106* 80 125* 76 115* 130*  BUN 30* 30* 29* 28* 29* 30*  CREATININE 1.15 1.22 1.09 1.12 1.13 1.18  CALCIUM 8.9 8.6 8.5 8.6 8.9 9.1  MG 1.7  --   --  1.5 1.3* 2.2  PHOS 4.1  --   --   --   --   --    Liver Function Tests:  Recent Labs Lab 02/06/13 1830  AST 19  ALT 9  ALKPHOS 61  BILITOT 0.5  PROT 7.9  ALBUMIN 2.6*   No results found for this basename: LIPASE, AMYLASE,  in the last 168 hours No results found for this basename: AMMONIA,  in the last 168 hours CBC:  Recent Labs Lab 02/06/13 1830 02/07/13 0507 02/08/13 0350 02/09/13 0500  WBC 4.5 8.9 5.5 5.6  NEUTROABS 2.8  --   --   --   HGB 14.0 15.4 13.6 14.0  HCT 43.2 46.9 40.8 43.1  MCV 87.8 88.7 85.9 86.2  PLT 191 178 166 148*   Cardiac Enzymes: No results found for this basename: CKTOTAL, CKMB, CKMBINDEX, TROPONINI,  in the last 168 hours BNP (last 3 results)  Recent Labs  02/06/13 1830  PROBNP 15989.0*   CBG:  Recent Labs Lab  02/09/13 0621 02/09/13 1137 02/09/13 1605 02/09/13 2125 02/10/13 0556  GLUCAP 115* 190* 176* 151* 129*    Recent Results (from the past 240 hour(s))  CULTURE, BLOOD (ROUTINE X 2)     Status: None   Collection Time    02/06/13  6:15 PM      Result Value Range Status   Specimen Description BLOOD ARM LEFT   Final   Special Requests BOTTLES DRAWN AEROBIC ONLY 5CC   Final   Culture  Setup Time 02/07/2013 00:23   Final   Culture     Final   Value:        BLOOD CULTURE RECEIVED NO GROWTH TO DATE CULTURE WILL BE HELD FOR 5 DAYS BEFORE ISSUING A FINAL NEGATIVE REPORT   Report Status PENDING   Incomplete  CULTURE, BLOOD (ROUTINE X 2)     Status: None   Collection Time    02/06/13  6:30 PM      Result Value Range Status   Specimen Description BLOOD HAND LEFT   Final   Special Requests BOTTLES DRAWN AEROBIC ONLY 8CC   Final   Culture  Setup Time 02/07/2013 00:22   Final   Culture     Final   Value:        BLOOD CULTURE RECEIVED NO GROWTH TO DATE CULTURE WILL BE HELD FOR 5 DAYS BEFORE ISSUING A FINAL NEGATIVE REPORT   Report Status PENDING   Incomplete  MRSA PCR SCREENING     Status: Abnormal   Collection Time    02/06/13 11:33 PM      Result Value Range Status   MRSA by PCR POSITIVE (*) NEGATIVE Final   Comment:            The GeneXpert MRSA Assay (FDA     approved for NASAL specimens     only), is one component of a     comprehensive MRSA colonization     surveillance program. It is not     intended to diagnose MRSA     infection nor to guide or     monitor treatment for     MRSA infections.     RESULT CALLED TO, READ BACK BY AND VERIFIED WITH:     [facility] RN [id number] AT 0048 SKEEN,P     Studies: Dg Chest Port 1 View  02/09/2013  *RADIOLOGY REPORT*  Clinical Data: Pulmonary edema.  Obesity.  PORTABLE CHEST - 1 VIEW  Comparison: Chest 02/08/2013 and 02/07/2013.  Findings: Left IJ catheter remains in place.  Aeration has improved compatible with decreased edema.  Marked  cardiomegaly again seen. No pneumothorax.  IMPRESSION: Improved pulmonary edema.   Original Report Authenticated  By: Holley Dexter, M.D.     Scheduled Meds: . carvedilol  12.5 mg Oral BID WC  . Chlorhexidine Gluconate Cloth  6 each Topical Q0600  . dabigatran  150 mg Oral Q12H  . furosemide  40 mg Intravenous Once  . hydrALAZINE  20 mg Intravenous Q6H  . insulin aspart  0-20 Units Subcutaneous TID WC  . lisinopril  10 mg Oral Daily  . mupirocin ointment  1 application Nasal BID  . pantoprazole  40 mg Oral Daily  . potassium chloride  40 mEq Oral BID  . sodium chloride  10-40 mL Intracatheter Q12H   Continuous Infusions: . furosemide (LASIX) infusion       Marinda Elk  [facility] Pager [phone].  If 8PM-8AM, please contact night-coverage at [email or web address], password [facility] 02/10/2013, 9:07 AM  LOS: 4 days

## 2013-02-11 LAB — CREATININE, URINE, RANDOM: Creatinine, Urine: 15.22 mg/dL

## 2013-02-11 LAB — GLUCOSE, CAPILLARY
Glucose-Capillary: 165 mg/dL — ABNORMAL HIGH (ref 70–99)
Glucose-Capillary: 195 mg/dL — ABNORMAL HIGH (ref 70–99)

## 2013-02-11 LAB — BASIC METABOLIC PANEL
BUN: 28 mg/dL — ABNORMAL HIGH (ref 6–23)
CO2: 34 mEq/L — ABNORMAL HIGH (ref 19–32)
GFR calc non Af Amer: 64 mL/min — ABNORMAL LOW (ref >90)
Glucose, Bld: 140 mg/dL — ABNORMAL HIGH (ref 70–99)
Potassium: 3.4 mEq/L — ABNORMAL LOW (ref 3.5–5.1)
Sodium: 136 mEq/L (ref 135–145)

## 2013-02-11 LAB — SODIUM, URINE, RANDOM: Sodium, Ur: 115 mEq/L

## 2013-02-11 MED ORDER — LISINOPRIL 40 MG PO TABS
40.0000 mg | ORAL_TABLET | Freq: Every day | ORAL | Status: DC
  Administered 2013-02-12 – 2013-02-14 (×3): 40 mg via ORAL
  Filled 2013-02-11 (×4): qty 1

## 2013-02-11 MED ORDER — POTASSIUM CHLORIDE CRYS ER 20 MEQ PO TBCR
40.0000 meq | EXTENDED_RELEASE_TABLET | Freq: Two times a day (BID) | ORAL | Status: AC
  Administered 2013-02-11 (×2): 40 meq via ORAL
  Filled 2013-02-11 (×2): qty 2

## 2013-02-11 MED ORDER — HALOPERIDOL LACTATE 5 MG/ML IJ SOLN
2.0000 mg | Freq: Four times a day (QID) | INTRAMUSCULAR | Status: DC | PRN
  Filled 2013-02-11: qty 0.4

## 2013-02-11 MED ORDER — MAGNESIUM OXIDE 400 (241.3 MG) MG PO TABS
400.0000 mg | ORAL_TABLET | Freq: Two times a day (BID) | ORAL | Status: DC
Start: 2013-02-11 — End: 2013-02-11

## 2013-02-11 MED ORDER — METOLAZONE 2.5 MG PO TABS
2.5000 mg | ORAL_TABLET | Freq: Once | ORAL | Status: AC
  Administered 2013-02-11: 2.5 mg via ORAL
  Filled 2013-02-11 (×2): qty 1

## 2013-02-11 NOTE — Progress Notes (Signed)
Pts heart rate down to 36 not sustained.  Pt. Asymptomatic and laying in bed.  BP 132/89.  HR sustaining a-fib in the 70s-80s.Lenny Pastel, NP made aware.  Will continue to monitor patient.

## 2013-02-11 NOTE — Care Management (Signed)
Pt placed on cpap for the night . tol well. 2lt bleed in.

## 2013-02-11 NOTE — Progress Notes (Addendum)
[facility] PROGRESS NOTE Interim History: 62 yo morbidly obese with OSA/OHS (not on positive pressure therapy) sent form NH after found being "less responsive then usual". In ED: Placed on BiPAP, noted to be bradycardic without hypotension.  Filed Weights   02/09/13 1102 02/10/13 0354 02/11/13 0555  Weight: 164.202 kg (362 lb) 171.505 kg (378 lb 1.6 oz) 174.181 kg (384 lb)     Assessment/Plan: Acute on chronic diastolic CHF/severe pulmonary hypertension with right heart failure  - Continue current IV diuresis ( good urine out put) and follow closely, monitor electrolytes closely. - Replete K and MG as needed. Low dose Metolazone. - weight continue to improve from admission ( 189.8 kg) - continue lisinopril and coreg. Titrate as tolerate it. - PT consult.  Encephalopathy, metabolic - now resolved. - treated with Bipap. - most likely 2/2 to untreated severe OSA.  Acute and chronic respiratory failure/ SLEEP APNEA: - continue O2. - Bipap at night.  Atrial fibrillation/Bradycardia: - Rate controlled. - Cont Pradaxa. - Stop digoxin long term  - Continue coreg 12.5mg  BID  CORONARY ARTERY DISEASE, S/P PTCA - pradaxa currently asx.  DIABETES MELLITUS, TYPE II - continue SSI.  Essential hypertension, benign: - coreg, lisinopril  Morbid obesity: - counseling    Code Status: full Family Communication: none  Disposition Plan: home 3-4 days   Consultants:  cardiology  Procedures:  Echo 2.20.2014: Pulmonary arteries: PA systolic pressure 82-86 mmHg, ejection fraction was 50%. Mild global hypokinesis  Antibiotics:  none (indicate start date, and stop date if known)  HPI/Subjective: No complains feels better.  Objective: Filed Vitals:   02/10/13 1246 02/10/13 1956 02/11/13 0215 02/11/13 0555  BP: 119/77 136/75 132/89 144/81  Pulse: 84 73 83 91  Temp: 97.4 F (36.3 C) 97.7 F (36.5 C)  98.1 F (36.7 C)  TempSrc: Oral Oral  Oral  Resp: 20 20 20 18    Height:      Weight:    174.181 kg (384 lb)  SpO2: 93% 92% 93% 92%   Filed Weights   02/09/13 1102 02/10/13 0354 02/11/13 0555  Weight: 164.202 kg (362 lb) 171.505 kg (378 lb 1.6 oz) 174.181 kg (384 lb)    Intake/Output Summary (Last 24 hours) at 02/11/13 0809 Last data filed at 02/11/13 1610  Gross per 24 hour  Intake 1012.4 ml  Output   3000 ml  Net -1987.6 ml   Filed Weights   02/09/13 1102 02/10/13 0354 02/11/13 0555  Weight: 164.202 kg (362 lb) 171.505 kg (378 lb 1.6 oz) 174.181 kg (384 lb)    Exam:  General: Alert, awake, oriented x3, in no acute distress.  HEENT: No bruits, no goiter.  Heart: Regular rate and rhythm, without murmurs, rubs, gallops. Massive edema. Lungs: Good air movement, bilateral air movement.  Abdomen: Soft, nontender, nondistended, positive bowel sounds.  Neuro: Grossly intact, nonfocal.   Data Reviewed: Basic Metabolic Panel:  Recent Labs Lab 02/07/13 0507 02/07/13 0811 02/08/13 0350 02/08/13 2150 02/09/13 0500 02/10/13 0600 02/10/13 0808  NA 140 144 144 141 141 139  --   K 5.6* 3.5 3.3* 3.5 3.4* 3.9  --   CL 103 104 101 97 97 96  --   CO2 27 34* 34* 33* 34* 36*  --   GLUCOSE 106* 80 125* 76 115* 130*  --   BUN 30* 30* 29* 28* 29* 30*  --   CREATININE 1.15 1.22 1.09 1.12 1.13 1.18  --   CALCIUM 8.9 8.6 8.5 8.6 8.9 9.1  --  MG 1.7  --   --  1.5 1.3* 2.2 2.2  PHOS 4.1  --   --   --   --   --   --    Liver Function Tests:  Recent Labs Lab 02/06/13 1830  AST 19  ALT 9  ALKPHOS 61  BILITOT 0.5  PROT 7.9  ALBUMIN 2.6*   No results found for this basename: LIPASE, AMYLASE,  in the last 168 hours No results found for this basename: AMMONIA,  in the last 168 hours CBC:  Recent Labs Lab 02/06/13 1830 02/07/13 0507 02/08/13 0350 02/09/13 0500  WBC 4.5 8.9 5.5 5.6  NEUTROABS 2.8  --   --   --   HGB 14.0 15.4 13.6 14.0  HCT 43.2 46.9 40.8 43.1  MCV 87.8 88.7 85.9 86.2  PLT 191 178 166 148*   Cardiac Enzymes: No  results found for this basename: CKTOTAL, CKMB, CKMBINDEX, TROPONINI,  in the last 168 hours BNP (last 3 results)  Recent Labs  02/06/13 1830  PROBNP 15989.0*   CBG:  Recent Labs Lab 02/10/13 0556 02/10/13 1137 02/10/13 1607 02/10/13 2125 02/11/13 0619  GLUCAP 129* 131* 123* 149* 135*    Recent Results (from the past 240 hour(s))  CULTURE, BLOOD (ROUTINE X 2)     Status: None   Collection Time    02/06/13  6:15 PM      Result Value Range Status   Specimen Description BLOOD ARM LEFT   Final   Special Requests BOTTLES DRAWN AEROBIC ONLY 5CC   Final   Culture  Setup Time 02/07/2013 00:23   Final   Culture     Final   Value:        BLOOD CULTURE RECEIVED NO GROWTH TO DATE CULTURE WILL BE HELD FOR 5 DAYS BEFORE ISSUING A FINAL NEGATIVE REPORT   Report Status PENDING   Incomplete  CULTURE, BLOOD (ROUTINE X 2)     Status: None   Collection Time    02/06/13  6:30 PM      Result Value Range Status   Specimen Description BLOOD HAND LEFT   Final   Special Requests BOTTLES DRAWN AEROBIC ONLY 8CC   Final   Culture  Setup Time 02/07/2013 00:22   Final   Culture     Final   Value:        BLOOD CULTURE RECEIVED NO GROWTH TO DATE CULTURE WILL BE HELD FOR 5 DAYS BEFORE ISSUING A FINAL NEGATIVE REPORT   Report Status PENDING   Incomplete  MRSA PCR SCREENING     Status: Abnormal   Collection Time    02/06/13 11:33 PM      Result Value Range Status   MRSA by PCR POSITIVE (*) NEGATIVE Final   Comment:            The GeneXpert MRSA Assay (FDA     approved for NASAL specimens     only), is one component of a     comprehensive MRSA colonization     surveillance program. It is not     intended to diagnose MRSA     infection nor to guide or     monitor treatment for     MRSA infections.     RESULT CALLED TO, READ BACK BY AND VERIFIED WITH:     [facility] RN [id number] AT 0048 SKEEN,P     Studies: No results found.  Scheduled Meds: . carvedilol  12.5 mg Oral BID WC  .  dabigatran  150  mg Oral Q12H  . insulin aspart  0-20 Units Subcutaneous TID WC  . lisinopril  20 mg Oral Daily  . mupirocin ointment  1 application Nasal BID  . pantoprazole  40 mg Oral Daily  . sodium chloride  10-40 mL Intracatheter Q12H   Continuous Infusions: . furosemide (LASIX) infusion 10 mg/hr (02/11/13 0747)     Marinda Elk  [facility] Pager [phone].  If 8PM-8AM, please contact night-coverage at [email or web address], password [facility] 02/11/2013, 8:09 AM  LOS: 5 days

## 2013-02-11 NOTE — Progress Notes (Signed)
SUBJECTIVE: The patient is doing well today.   At this time, he denies chest pain, SOB or any new concerns.  Diuresing well.  Weights do not appear to be accurate.  . carvedilol  12.5 mg Oral BID WC  . dabigatran  150 mg Oral Q12H  . insulin aspart  0-20 Units Subcutaneous TID WC  . lisinopril  20 mg Oral Daily  . metolazone  2.5 mg Oral Once  . mupirocin ointment  1 application Nasal BID  . pantoprazole  40 mg Oral Daily  . potassium chloride  40 mEq Oral BID  . sodium chloride  10-40 mL Intracatheter Q12H   . furosemide (LASIX) infusion 10 mg/hr (02/11/13 0747)    OBJECTIVE: Physical Exam: Filed Vitals:   02/10/13 1246 02/10/13 1956 02/11/13 0215 02/11/13 0555  BP: 119/77 136/75 132/89 144/81  Pulse: 84 73 83 91  Temp: 97.4 F (36.3 C) 97.7 F (36.5 C)  98.1 F (36.7 C)  TempSrc: Oral Oral  Oral  Resp: 20 20 20 18   Height:      Weight:    384 lb (174.181 kg)  SpO2: 93% 92% 93% 92%    Intake/Output Summary (Last 24 hours) at 02/11/13 1914 Last data filed at 02/11/13 7829  Gross per 24 hour  Intake 1012.4 ml  Output   3000 ml  Net -1987.6 ml    Telemetry reveals afib, very frequent ventricular ectopy  GEN- The patient is morbidly obese and chronically appearing,  Sleeping but rouses Head- normocephalic, atraumatic Eyes-  Sclera clear, conjunctiva pink Ears- hearing intact Oropharynx- clear Neck- supple, L CVL in place Lungs- decreased throughout Heart- irregular rate and rhythm, distant GI- soft, NT, ND, + BS Extremities- no clubbing, cyanosis, + dependant edema Psych- euthymic mood, full affect Neuro- strength and sensation are intact  LABS: Basic Metabolic Panel:  Recent Labs  56/21/30 0500 02/10/13 0600 02/10/13 0808  NA 141 139  --   K 3.4* 3.9  --   CL 97 96  --   CO2 34* 36*  --   GLUCOSE 115* 130*  --   BUN 29* 30*  --   CREATININE 1.13 1.18  --   CALCIUM 8.9 9.1  --   MG 1.3* 2.2 2.2   Liver Function Tests: No results found for  this basename: AST, ALT, ALKPHOS, BILITOT, PROT, ALBUMIN,  in the last 72 hours No results found for this basename: LIPASE, AMYLASE,  in the last 72 hours CBC:  Recent Labs  02/09/13 0500  WBC 5.6  HGB 14.0  HCT 43.1  MCV 86.2  PLT 148*    RADIOLOGY: Dg Chest Port 1 View  02/08/2013  *RADIOLOGY REPORT*  Clinical Data: Pulmonary edema.  PORTABLE CHEST - 1 VIEW  Comparison: 02/07/2013  Findings: Portable view of the chest was obtained.  Jugular central venous catheter in the upper SVC region.  Again noted is marked enlargement of the cardiac silhouette. Diffuse parenchymal lung densities are suggestive for edema.  Cannot exclude pleural effusions, particularly on the right side.  IMPRESSION: Minimal change from the previous examination. There is marked enlargement of the cardiac silhouette and evidence for pulmonary edema.  Cannot exclude pleural effusions.   Original Report Authenticated By: Richarda Overlie, M.D.    Dg Chest Port 1 View  02/07/2013  *RADIOLOGY REPORT*  Clinical Data: Central line placement.  PORTABLE CHEST - 1 VIEW  Comparison: 02/06/2013.  Findings: The left IJ central venous catheter the tip is in the mid  SVC at the level of the carina.  No complicating features.  The heart remains enlarged and there is persistent vascular congestion and pulmonary edema but slight improved lung aeration.  No large pleural effusion.  IMPRESSION:  1.  Left IJ catheter in good position without complicating features. 2.  Persistent cardiac enlargement and pulmonary edema but slight interval improved lung aeration.   Original Report Authenticated By: Rudie Meyer, M.D.    Dg Chest Port 1 View  02/06/2013  *RADIOLOGY REPORT*  Clinical Data: CHF  PORTABLE CHEST - 1 VIEW  Comparison: Prior chest x-ray 12/22/2011  Findings: Increased pulmonary vascular congestion now with mild interstitial edema.  Marked enlargement of the cardiopericardial silhouette similar to prior.  Enlarged at venous vascular stripe  along the right the tracheal contour.  Small right pleural effusion.  No acute osseous abnormality.  IMPRESSION:  1.  Pulmonary edema and small right effusion consistent with CHF. 2.  Stable cardiomegaly.   Original Report Authenticated By: Malachy Moan, M.D.     ASSESSMENT AND PLAN:  Principal Problem:   Encephalopathy, metabolic Active Problems:   DIABETES MELLITUS, TYPE II   Morbid obesity   Essential hypertension, benign   CORONARY ARTERY DISEASE, S/P PTCA   CARDIOMYOPATHY, PRIMARY   Atrial fibrillation   SLEEP APNEA   Chronic respiratory failure   Bradycardia   Acute on chronic diastolic CHF (congestive heart failure)   Acute and chronic respiratory failure (acute-on-chronic)   Hyperkalemia  1. afib- he appears to have permanent afib Back on pradaxa and coreg  2. Bradycardia- improving off of digoxin  Stop digoxin long term Continue coreg 12.5mg  BID  3. Acute on chronic diastolic dysfunction/ severe pulmonary hypertension with right heart failure Continue IV diuresis and follow closely Replete K and MG Echo reviewed Treat underlying lung disease Weight loss is necessary to adequately treat this patient  4. HTN BP is better Will increase lisinopril to 20mg  daily Consider spironolactone if BP remains elevated.  5. OSA/ respiratory failure/ AMS- per primary team  I think he is near his baseline, which is a very poor functional state.  Plans for long term management per primary team. He may require SNF or even LTAC.  Hillis Range, MD 02/11/2013 9:36 AM

## 2013-02-11 NOTE — Progress Notes (Signed)
1400 Pt with lapses of confusion and agitation . Attempting to get out of bed  And wanting to leave the unit aND FIX HIS CAR  A in a hotel . Safety measures provided . Reorientation done .Seen and evaluated by Dr. Radonna Ricker Robb Matar with orders   Calm down on his own w/o meds  given . Reassurance provided Continue monotoring done

## 2013-02-11 NOTE — Evaluation (Signed)
Physical Therapy Evaluation Patient Details Name: [name] MRN: [id number] DOB: 06-May-1951 Today's Date: 02/11/2013 Time: 0865-7846 PT Time Calculation (min): 12 min  PT Assessment / Plan / Recommendation Clinical Impression  Patient is a 62 yo male admitted with metabolic encephalopathy, untreated OSA, CHF, bradycardia.  Patient has been in a nursing facility and non-ambulatory x 1 year per Particia J[facility]).  Patient was able to transfer to chair at one point during the year - most recently using lift equipment to get OOB.  Patient will benefit from PT to maximize independence with mobility prior to return to SNF.    PT Assessment  Patient needs continued PT services    Follow Up Recommendations  SNF    Does the patient have the potential to tolerate intense rehabilitation      Barriers to Discharge        Equipment Recommendations  None recommended by PT    Recommendations for Other Services     Frequency Min 2X/week    Precautions / Restrictions Precautions Precautions: Fall Restrictions Weight Bearing Restrictions: No   Pertinent Vitals/Pain       Mobility  Bed Mobility Bed Mobility: Rolling Left;Rolling Right Rolling Right: 1: +2 Total assist;With rail Rolling Right: Patient Percentage: 20% Rolling Left: 1: +2 Total assist;With rail Rolling Left: Patient Percentage: 20% Details for Bed Mobility Assistance: Verbal cues for technique.  Patient initiates movement by reaching toward rail - unable to roll upper body enough to reach rail.  Requires +2 total assist to roll and unable to reach sidelying. Transfers Transfers: Not assessed    Exercises General Exercises - Lower Extremity Ankle Circles/Pumps: AROM;Both;10 reps;Supine   PT Diagnosis: Difficulty walking;Generalized weakness;Altered mental status;Acute pain  PT Problem List: Decreased strength;Decreased range of motion;Decreased activity tolerance;Decreased mobility;Decreased  cognition;Decreased knowledge of use of DME;Cardiopulmonary status limiting activity;Impaired sensation;Obesity;Pain PT Treatment Interventions: Functional mobility training;Therapeutic exercise;Balance training;Patient/family education   PT Goals Acute Rehab PT Goals PT Goal Formulation: Patient unable to participate in goal setting Time For Goal Achievement: 02/25/13 Potential to Achieve Goals: Fair Pt will Roll Supine to Right Side: with min assist;with rail PT Goal: Rolling Supine to Right Side - Progress: Goal set today Pt will Roll Supine to Left Side: with min assist;with rail PT Goal: Rolling Supine to Left Side - Progress: Goal set today Pt will go Supine/Side to Sit: with mod assist;with HOB not 0 degrees (comment degree);with rail PT Goal: Supine/Side to Sit - Progress: Goal set today Pt will Sit at Edge of Bed: with min assist;6-10 min;with unilateral upper extremity support PT Goal: Sit at Edge Of Bed - Progress: Goal set today Pt will go Sit to Supine/Side: with mod assist;with HOB not 0 degrees (comment degree);with rail PT Goal: Sit to Supine/Side - Progress: Goal set today  Visit Information  Last PT Received On: 02/11/13 Assistance Needed: +2    Subjective Data  Subjective: "It's the 23rd"  Perseverated on this response for multiple questions (How old are you? What month is it? What year is it?) Patient Stated Goal: None stated   Prior Functioning  Home Living Lives With: Other (Comment) (At SNF) Available Help at Discharge: Skilled Nursing Facility Prior Function Level of Independence: Needs assistance Needs Assistance: Bathing;Dressing;Toileting;Meal Prep;Light Housekeeping;Transfers Bath: Maximal Dressing: Maximal Toileting: Maximal Meal Prep: Total Light Housekeeping: Total Transfer Assistance: Patient is non-ambulatory (x 1 year per Particia Jasper).  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 Awareness of Errors: Assistance required to identify errors made;Assistance required to correct errors made Awareness of Errors - Other Comments: Perseverating on "The 23rd" and used this to answer "How old are you"  Needed correction of error. Awareness of Deficits: Unable to state why he is in hospital    Extremity/Trunk Assessment Right Upper Extremity Assessment RUE ROM/Strength/Tone: Deficits RUE ROM/Strength/Tone Deficits: Grossly 4-/5; Noted edema in hands RUE Sensation: WFL - Light Touch Left Upper Extremity Assessment LUE ROM/Strength/Tone: Deficits LUE ROM/Strength/Tone Deficits: Grossly 4-/5; Noted edema in hand LUE Sensation: WFL - Light Touch Right Lower Extremity Assessment RLE ROM/Strength/Tone: Deficits RLE ROM/Strength/Tone Deficits: Ankle movement 3/5; knee and hip 1/5 RLE Sensation: History of peripheral neuropathy Left Lower Extremity Assessment LLE ROM/Strength/Tone: Deficits LLE ROM/Strength/Tone Deficits: Ankle movement 2/5; knee and hip 1/5 LLE Sensation: History of peripheral neuropathy   Balance    End of Session PT - End of Session Activity Tolerance: Patient limited by fatigue Patient left: in bed;with call bell/phone within reach Nurse Communication: Mobility status;Need for lift equipment  GP     Vena Austria 02/11/2013, 10:14 AM Durenda Hurt. Renaldo Fiddler, [facility] Acute Rehab Services Pager [phone]

## 2013-02-12 LAB — BASIC METABOLIC PANEL
BUN: 27 mg/dL — ABNORMAL HIGH (ref 6–23)
BUN: 25 mg/dL — ABNORMAL HIGH (ref 6–23)
Calcium: 9.4 mg/dL (ref 8.4–10.5)
Chloride: 94 mEq/L — ABNORMAL LOW (ref 96–112)
Creatinine, Ser: 1.23 mg/dL (ref 0.50–1.35)
GFR calc Af Amer: 75 mL/min — ABNORMAL LOW (ref >90)
GFR calc Af Amer: 72 mL/min — ABNORMAL LOW (ref >90)
GFR calc non Af Amer: 65 mL/min — ABNORMAL LOW (ref >90)
Potassium: 3.5 mEq/L (ref 3.5–5.1)
Sodium: 137 mEq/L (ref 135–145)

## 2013-02-12 LAB — GLUCOSE, CAPILLARY
Glucose-Capillary: 189 mg/dL — ABNORMAL HIGH (ref 70–99)
Glucose-Capillary: 162 mg/dL — ABNORMAL HIGH (ref 70–99)
Glucose-Capillary: 179 mg/dL — ABNORMAL HIGH (ref 70–99)

## 2013-02-12 MED ORDER — METOLAZONE 5 MG PO TABS
5.0000 mg | ORAL_TABLET | Freq: Every day | ORAL | Status: DC
  Administered 2013-02-12: 5 mg via ORAL
  Filled 2013-02-12 (×2): qty 1

## 2013-02-12 MED ORDER — SPIRONOLACTONE 12.5 MG HALF TABLET
12.5000 mg | ORAL_TABLET | Freq: Every day | ORAL | Status: DC
  Administered 2013-02-12 – 2013-02-13 (×2): 12.5 mg via ORAL
  Filled 2013-02-12 (×3): qty 1

## 2013-02-12 MED ORDER — METOLAZONE 5 MG PO TABS: 5.0000 mg | ORAL_TABLET | Freq: Two times a day (BID) | ORAL | Status: DC

## 2013-02-12 MED ORDER — POTASSIUM CHLORIDE CRYS ER 20 MEQ PO TBCR
40.0000 meq | EXTENDED_RELEASE_TABLET | Freq: Two times a day (BID) | ORAL | Status: AC
  Administered 2013-02-12 (×2): 40 meq via ORAL
  Filled 2013-02-12 (×2): qty 2

## 2013-02-12 NOTE — Progress Notes (Signed)
[facility] PROGRESS NOTE Interim History: 62 yo morbidly obese with OSA/OHS (not on positive pressure therapy) sent form NH after found being "less responsive then usual". In ED: Placed on BiPAP, noted to be bradycardic without hypotension.  Filed Weights   02/10/13 0354 02/11/13 0555 02/12/13 0450  Weight: 171.505 kg (378 lb 1.6 oz) 174.181 kg (384 lb) 168.738 kg (372 lb)     Assessment/Plan: Acute on chronic diastolic CHF/severe pulmonary hypertension with right heart failure  - Continue current IV diuresis ( good urine out put) and follow closely, monitor electrolytes closely. - Replete K and MG as needed. Low dose Metolazone add low dose spironolactone. - weight continue to improve from admission ( 189.8 kg) - continue lisinopril and coreg. Titrate as tolerate it. - PT consult. - b-met pending.  Encephalopathy, metabolic - now resolved. - treated with Bipap. - most likely 2/2 to untreated severe OSA.  Acute and chronic respiratory failure/ SLEEP APNEA: - continue O2. - Bipap at night.  Atrial fibrillation/Bradycardia: - Rate controlled. - Cont Pradaxa. - Stop digoxin long term  - Continue coreg 12.5mg  BID  CORONARY ARTERY DISEASE, S/P PTCA - pradaxa currently asx.  DIABETES MELLITUS, TYPE II - continue SSI.  Essential hypertension, benign: - coreg, lisinopril  Morbid obesity: - counseling    Code Status: full Family Communication: none  Disposition Plan: LTAC   Consultants:  cardiology  Procedures:  Echo 2.20.2014: Pulmonary arteries: PA systolic pressure 82-86 mmHg, ejection fraction was 50%. Mild global hypokinesis  Antibiotics:  none (indicate start date, and stop date if known)  HPI/Subjective: No complains.  Objective: Filed Vitals:   02/11/13 2231 02/12/13 0355 02/12/13 0358 02/12/13 0450  BP:  183/111 140/90 176/112  Pulse:   81 73  Temp:    98.1 F (36.7 C)  TempSrc:    Oral  Resp:    20  Height:      Weight:    168.738  kg (372 lb)  SpO2: 92%  93% 97%   Filed Weights   02/10/13 0354 02/11/13 0555 02/12/13 0450  Weight: 171.505 kg (378 lb 1.6 oz) 174.181 kg (384 lb) 168.738 kg (372 lb)    Intake/Output Summary (Last 24 hours) at 02/12/13 0943 Last data filed at 02/12/13 0900  Gross per 24 hour  Intake  980.4 ml  Output   5825 ml  Net -4844.6 ml   Filed Weights   02/10/13 0354 02/11/13 0555 02/12/13 0450  Weight: 171.505 kg (378 lb 1.6 oz) 174.181 kg (384 lb) 168.738 kg (372 lb)    Exam:  General: Alert, awake, oriented x3, in no acute distress.  HEENT: No bruits, no goiter.  Heart: Regular rate and rhythm, without murmurs, rubs, gallops. Massive edema. Lungs: Good air movement, bilateral air movement. [place] Abdomen: Soft, nontender, nondistended, positive bowel sounds.  Neuro: Grossly intact, nonfocal.   Data Reviewed: Basic Metabolic Panel:  Recent Labs Lab 02/07/13 0507  02/08/13 2150 02/09/13 0500 02/10/13 0600 02/10/13 0808 02/11/13 1120 02/12/13 0527  NA 140  < > 141 141 139  --  136 137  K 5.6*  < > 3.5 3.4* 3.9  --  3.4* 3.5  CL 103  < > 97 97 96  --  95* 94*  CO2 27  < > 33* 34* 36*  --  34* 37*  GLUCOSE 106*  < > 76 115* 130*  --  140* 175*  BUN 30*  < > 28* 29* 30*  --  28* 27*  CREATININE 1.15  < >  1.12 1.13 1.18  --  1.20 1.19  CALCIUM 8.9  < > 8.6 8.9 9.1  --  8.8 9.0  MG 1.7  --  1.5 1.3* 2.2 2.2  --  1.6  PHOS 4.1  --   --   --   --   --   --   --   < > = values in this interval not displayed. Liver Function Tests:  Recent Labs Lab 02/06/13 1830  AST 19  ALT 9  ALKPHOS 61  BILITOT 0.5  PROT 7.9  ALBUMIN 2.6*   No results found for this basename: LIPASE, AMYLASE,  in the last 168 hours No results found for this basename: AMMONIA,  in the last 168 hours CBC:  Recent Labs Lab 02/06/13 1830 02/07/13 0507 02/08/13 0350 02/09/13 0500  WBC 4.5 8.9 5.5 5.6  NEUTROABS 2.8  --   --   --   HGB 14.0 15.4 13.6 14.0  HCT 43.2 46.9 40.8 43.1  MCV 87.8  88.7 85.9 86.2  PLT 191 178 166 148*   Cardiac Enzymes: No results found for this basename: CKTOTAL, CKMB, CKMBINDEX, TROPONINI,  in the last 168 hours BNP (last 3 results)  Recent Labs  02/06/13 1830  PROBNP 15989.0*   CBG:  Recent Labs Lab 02/11/13 0619 02/11/13 1147 02/11/13 1641 02/11/13 2056 02/12/13 0621  GLUCAP 135* 146* 165* 195* 189*    Recent Results (from the past 240 hour(s))  CULTURE, BLOOD (ROUTINE X 2)     Status: None   Collection Time    02/06/13  6:15 PM      Result Value Range Status   Specimen Description BLOOD ARM LEFT   Final   Special Requests BOTTLES DRAWN AEROBIC ONLY 5CC   Final   Culture  Setup Time 02/07/2013 00:23   Final   Culture     Final   Value:        BLOOD CULTURE RECEIVED NO GROWTH TO DATE CULTURE WILL BE HELD FOR 5 DAYS BEFORE ISSUING A FINAL NEGATIVE REPORT   Report Status PENDING   Incomplete  CULTURE, BLOOD (ROUTINE X 2)     Status: None   Collection Time    02/06/13  6:30 PM      Result Value Range Status   Specimen Description BLOOD HAND LEFT   Final   Special Requests BOTTLES DRAWN AEROBIC ONLY 8CC   Final   Culture  Setup Time 02/07/2013 00:22   Final   Culture     Final   Value:        BLOOD CULTURE RECEIVED NO GROWTH TO DATE CULTURE WILL BE HELD FOR 5 DAYS BEFORE ISSUING A FINAL NEGATIVE REPORT   Report Status PENDING   Incomplete  MRSA PCR SCREENING     Status: Abnormal   Collection Time    02/06/13 11:33 PM      Result Value Range Status   MRSA by PCR POSITIVE (*) NEGATIVE Final   Comment:            The GeneXpert MRSA Assay (FDA     approved for NASAL specimens     only), is one component of a     comprehensive MRSA colonization     surveillance program. It is not     intended to diagnose MRSA     infection nor to guide or     monitor treatment for     MRSA infections.     RESULT CALLED TO, READ  BACK BY AND VERIFIED WITH:     [facility] RN [id number] AT 0048 SKEEN,P     Studies: No results  found.  Scheduled Meds: . carvedilol  12.5 mg Oral BID WC  . dabigatran  150 mg Oral Q12H  . insulin aspart  0-20 Units Subcutaneous TID WC  . lisinopril  40 mg Oral Daily  . metolazone  5 mg Oral Daily  . pantoprazole  40 mg Oral Daily  . spironolactone  12.5 mg Oral Daily   Continuous Infusions: . furosemide (LASIX) infusion 12 mg/hr (02/11/13 1424)     Radonna Ricker Rosine Beat  [facility] Pager [phone].  If 8PM-8AM, please contact night-coverage at [email or web address], password [email or web address] 02/12/2013, 9:43 AM  LOS: 6 days

## 2013-02-12 NOTE — Progress Notes (Signed)
02/12/13 1045 Noted new order for LTAC referral.  TC to Boneta Lucks, with [facility] hospital, and she noted that Medicaid does not pay for LTAC.  Made Dr. Robb Matar aware.  Pt. will remain here as Inpatient for diuresis.  CSW consulted for return to SNF placement on discharge. Tera Mater, RN, BSN NCM [phone]

## 2013-02-12 NOTE — Progress Notes (Signed)
Pts. Heart rate down to 28 not sustained, however staying in 30s for several seconds before going back up to 60s-80s.  Pt. Asymptomatic and asleep in bed.  BP 140/90.  Lenny Pastel, NP made aware.  Will continue to monitor patient.

## 2013-02-12 NOTE — Progress Notes (Signed)
Inpatient Diabetes Program Recommendations  AACE/ADA: New Consensus Statement on Inpatient Glycemic Control (2013)  Target Ranges:  Prepandial:   less than 140 mg/dL      Peak postprandial:   less than 180 mg/dL (1-2 hours)      Critically ill patients:  140 - 180 mg/dL   Results for [name], [name] (MRN [id number]) as of 02/12/2013 10:40  Ref. Range 02/11/2013 06:19 02/11/2013 11:47 02/11/2013 16:41 02/11/2013 20:56 02/12/2013 06:21  Glucose-Capillary Latest Range: 70-99 mg/dL 784 (H) 696 (H) 295 (H) 195 (H) 189 (H)    Inpatient Diabetes Program Recommendations Insulin - Basal: Please consider ordering Lantus 5 units QHS. HgbA1C: Please consider ordering an A1C to determine glycemic control over the last 2-3 months.  Last A1C in the chart is 7.8% on 02/22/2011.  Note: Patient has a history of diabetes and takes Lantus 25 units QHS and Metformin 1000 mg BID at home for diabetes management.  Currently, patient is ordered to receive Novolog resistant correction AC for inpatient glycemic control.  In reviewing the chart and assessing glycemic control, it was noted that patient received a total of Novolog 8 units on 2/21, Novolog 9 units on 2/22, and Novolog 10 units on 2/23.  Please consider ordering low dose basal insulin, recommend Lantus 5 units QHS and adjust accordingly.  Also, please consider ordering an A1C to determine glycemic control over the last 2-3 months.  Will continue to follow.  Thanks, Orlando Penner, RN, BSN, CCRN Diabetes Coordinator Inpatient Diabetes Program [phone]

## 2013-02-12 NOTE — Progress Notes (Signed)
SUBJECTIVE: Patient denies dyspnea or chest pain  . carvedilol  12.5 mg Oral BID WC  . dabigatran  150 mg Oral Q12H  . insulin aspart  0-20 Units Subcutaneous TID WC  . lisinopril  40 mg Oral Daily  . metolazone  5 mg Oral Daily  . pantoprazole  40 mg Oral Daily  . spironolactone  12.5 mg Oral Daily   . furosemide (LASIX) infusion 12 mg/hr (02/11/13 1424)    OBJECTIVE: Physical Exam: Filed Vitals:   02/11/13 2231 02/12/13 0355 02/12/13 0358 02/12/13 0450  BP:  183/111 140/90 176/112  Pulse:   81 73  Temp:    98.1 F (36.7 C)  TempSrc:    Oral  Resp:    20  Height:      Weight:    372 lb (168.738 kg)  SpO2: 92%  93% 97%    Intake/Output Summary (Last 24 hours) at 02/12/13 9604 Last data filed at 02/12/13 5409  Gross per 24 hour  Intake  980.4 ml  Output   5900 ml  Net -4919.6 ml    Telemetry reveals afib with NSVT vs aberrancy; bradycardia while asleep  GEN- The patient is morbidly obese and chronically appearing Head- normocephalic, atraumatic Neck- supple Lungs- decreased throughout Heart- irregular rate and rhythm, distant GI- soft, NT, ND; abdominal wall edema Extremities-1 + dependant edema Neuro- no gross focal findings  LABS: Basic Metabolic Panel:  Recent Labs  81/19/14 0808 02/11/13 1120 02/12/13 0527  NA  --  136 137  K  --  3.4* 3.5  CL  --  95* 94*  CO2  --  34* 37*  GLUCOSE  --  140* 175*  BUN  --  28* 27*  CREATININE  --  1.20 1.19  CALCIUM  --  8.8 9.0  MG 2.2  --  1.6   RADIOLOGY: Dg Chest Port 1 View  02/08/2013  *RADIOLOGY REPORT*  Clinical Data: Pulmonary edema.  PORTABLE CHEST - 1 VIEW  Comparison: 02/07/2013  Findings: Portable view of the chest was obtained.  Jugular central venous catheter in the upper SVC region.  Again noted is marked enlargement of the cardiac silhouette. Diffuse parenchymal lung densities are suggestive for edema.  Cannot exclude pleural effusions, particularly on the right side.  IMPRESSION: Minimal  change from the previous examination. There is marked enlargement of the cardiac silhouette and evidence for pulmonary edema.  Cannot exclude pleural effusions.   Original Report Authenticated By: Richarda Overlie, M.D.    Dg Chest Port 1 View  02/07/2013  *RADIOLOGY REPORT*  Clinical Data: Central line placement.  PORTABLE CHEST - 1 VIEW  Comparison: 02/06/2013.  Findings: The left IJ central venous catheter the tip is in the mid SVC at the level of the carina.  No complicating features.  The heart remains enlarged and there is persistent vascular congestion and pulmonary edema but slight improved lung aeration.  No large pleural effusion.  IMPRESSION:  1.  Left IJ catheter in good position without complicating features. 2.  Persistent cardiac enlargement and pulmonary edema but slight interval improved lung aeration.   Original Report Authenticated By: Rudie Meyer, M.D.    Dg Chest Port 1 View  02/06/2013  *RADIOLOGY REPORT*  Clinical Data: CHF  PORTABLE CHEST - 1 VIEW  Comparison: Prior chest x-ray 12/22/2011  Findings: Increased pulmonary vascular congestion now with mild interstitial edema.  Marked enlargement of the cardiopericardial silhouette similar to prior.  Enlarged at venous vascular stripe along the right  the tracheal contour.  Small right pleural effusion.  No acute osseous abnormality.  IMPRESSION:  1.  Pulmonary edema and small right effusion consistent with CHF. 2.  Stable cardiomegaly.   Original Report Authenticated By: Malachy Moan, M.D.     ASSESSMENT AND PLAN:  Principal Problem:   Acute on chronic diastolic CHF (congestive heart failure) Active Problems:   DIABETES MELLITUS, TYPE II   Morbid obesity   Essential hypertension, benign   CORONARY ARTERY DISEASE, S/P PTCA   CARDIOMYOPATHY, PRIMARY   Atrial fibrillation   SLEEP APNEA   Chronic respiratory failure   Bradycardia   Acute and chronic respiratory failure (acute-on-chronic)   Hyperkalemia   Encephalopathy,  metabolic  1. afib- permanent afib; continue pradaxa and coreg  2. Bradycardia-HR improved; noted to be bradycardic only while sleeping; now off clonidine and dig. Continue coreg 12.5mg  BID  3. Acute on chronic diastolic dysfunction/ severe pulmonary hypertension with right heart failure; remains volume overloaded. Continue IV diuresis and follow closely. May need to DC metolazone pending fu NA and renal function. Replete K and MG. Weight loss is necessary to adequately treat this patient  4. HTN BP is better; continue present meds.  5. OSA/ respiratory failure/ AMS- per primary team   Olga Millers, MD 02/12/2013 8:07 AM

## 2013-02-12 NOTE — Plan of Care (Signed)
Problem: Phase I Progression Outcomes Goal: EF % per last Echo/documented,Core Reminder form on chart Outcome: Completed/Met Date Met:  02/12/13 Echo done 02/08/2013 showed EF 50%

## 2013-02-13 LAB — GLUCOSE, CAPILLARY: Glucose-Capillary: 117 mg/dL — ABNORMAL HIGH (ref 70–99)

## 2013-02-13 LAB — BASIC METABOLIC PANEL
BUN: 25 mg/dL — ABNORMAL HIGH (ref 6–23)
Chloride: 89 mEq/L — ABNORMAL LOW (ref 96–112)
GFR calc Af Amer: 76 mL/min — ABNORMAL LOW (ref >90)
GFR calc non Af Amer: 65 mL/min — ABNORMAL LOW (ref >90)
Glucose, Bld: 117 mg/dL — ABNORMAL HIGH (ref 70–99)
Potassium: 3 mEq/L — ABNORMAL LOW (ref 3.5–5.1)
Sodium: 139 mEq/L (ref 135–145)

## 2013-02-13 LAB — CULTURE, BLOOD (ROUTINE X 2): Culture: NO GROWTH

## 2013-02-13 MED ORDER — POLYETHYLENE GLYCOL 3350 17 G PO PACK
17.0000 g | PACK | Freq: Once | ORAL | Status: AC
  Administered 2013-02-13: 17 g via ORAL
  Filled 2013-02-13: qty 1

## 2013-02-13 MED ORDER — DULOXETINE HCL 20 MG PO CPEP
40.0000 mg | ORAL_CAPSULE | Freq: Every day | ORAL | Status: DC
  Administered 2013-02-13 – 2013-02-15 (×3): 40 mg via ORAL
  Filled 2013-02-13 (×3): qty 2

## 2013-02-13 MED ORDER — POTASSIUM CHLORIDE CRYS ER 20 MEQ PO TBCR
40.0000 meq | EXTENDED_RELEASE_TABLET | Freq: Three times a day (TID) | ORAL | Status: AC
  Administered 2013-02-13 (×3): 40 meq via ORAL
  Filled 2013-02-13 (×3): qty 2

## 2013-02-13 MED ORDER — HALOPERIDOL LACTATE 5 MG/ML IJ SOLN
5.0000 mg | Freq: Once | INTRAMUSCULAR | Status: DC
  Filled 2013-02-13: qty 1

## 2013-02-13 MED ORDER — GABAPENTIN 300 MG PO CAPS
300.0000 mg | ORAL_CAPSULE | Freq: Three times a day (TID) | ORAL | Status: DC
  Administered 2013-02-13 – 2013-02-15 (×7): 300 mg via ORAL
  Filled 2013-02-13 (×8): qty 1

## 2013-02-13 MED ORDER — METOLAZONE 2.5 MG PO TABS
2.5000 mg | ORAL_TABLET | Freq: Once | ORAL | Status: AC
  Administered 2013-02-13: 2.5 mg via ORAL
  Filled 2013-02-13: qty 1

## 2013-02-13 MED ORDER — TRAMADOL HCL 50 MG PO TABS: 50.0000 mg | ORAL_TABLET | Freq: Once | ORAL | Status: DC

## 2013-02-13 MED ORDER — MAGNESIUM SULFATE 40 MG/ML IJ SOLN
2.0000 g | Freq: Once | INTRAMUSCULAR | Status: AC
  Administered 2013-02-13: 2 g via INTRAVENOUS
  Filled 2013-02-13: qty 50

## 2013-02-13 MED ORDER — METHIMAZOLE 10 MG PO TABS
10.0000 mg | ORAL_TABLET | Freq: Every day | ORAL | Status: DC
  Administered 2013-02-13 – 2013-02-15 (×3): 10 mg via ORAL
  Filled 2013-02-13 (×3): qty 1

## 2013-02-13 MED ORDER — MAGNESIUM OXIDE 400 (241.3 MG) MG PO TABS
800.0000 mg | ORAL_TABLET | Freq: Two times a day (BID) | ORAL | Status: AC
  Administered 2013-02-13 (×2): 800 mg via ORAL
  Filled 2013-02-13 (×2): qty 2

## 2013-02-13 NOTE — Progress Notes (Addendum)
Patient was transferred to 4700 with report provided by Theresia Bough, LCSWA.  Plan for return to [facility] when medically stable - per Dr. David Stall- possible d/c back to SNF on 02/15/13. Discussed with Marylene Land at [place]- bed will be available for patient when medically stable.    CSW will monitor and assist with d/c when stable.  Fl2 is on chart for MD's signature.  Lorri Frederick. [place]  [phone]

## 2013-02-13 NOTE — Plan of Care (Signed)
Problem: Phase II Progression Outcomes Goal: Walk in hall or up in chair TID Outcome: Not Applicable Date Met:  02/13/13 Pt doesn't walk at baseline

## 2013-02-13 NOTE — Progress Notes (Signed)
Patient alarmed with episode of sustained VTACH.  Patient asymptomatic and asleep in bed. Vital signs obtained.  Patient converted back to AFIB without interventions. NP Schorr, made aware.  RN will continue to monitor. Louretta Parma, RN

## 2013-02-13 NOTE — Progress Notes (Signed)
Thank you to Dr Robb Matar for this referral.  Patient is unfortunately not eligible for [facility] services because he has Medicaid only and no present Medicare benefit.  However, the patient may be appropriate for [facility].  Will notify Lovette Cliche LCSW to consider [facility] for community care support in an effort to reduce risk of patient readmission.  Of note, [facility] Care Management services does not replace or interfere with any services that are arranged by inpatient case management or social work.  For additional questions or referrals please contact Anibal Henderson BSN RN [facility] Liaison at [phone].

## 2013-02-13 NOTE — Progress Notes (Signed)
Physical Therapy Treatment Patient Details Name: [name] MRN: [id number] DOB: 1951/05/21 Today's Date: 02/13/2013 Time: 5621-3086 PT Time Calculation (min): 28 min  PT Assessment / Plan / Recommendation Comments on Treatment Session  Patient is a 62 yo male admitted with metabolic encephalopathy, untreated OSA, CHF, bradycardia. Slow progress towards goals. SNF appropriate.     Follow Up Recommendations  SNF     Does the patient have the potential to tolerate intense rehabilitation     Barriers to Discharge        Equipment Recommendations  None recommended by PT    Recommendations for Other Services    Frequency Min 2X/week   Plan Discharge plan remains appropriate;Frequency remains appropriate    Precautions / Restrictions Precautions Precautions: Fall Precaution Comments: has not walked  Restrictions Weight Bearing Restrictions: No   Pertinent Vitals/Pain 99% on 2 liters     Mobility  Bed Mobility Bed Mobility: Rolling Right;Right Sidelying to Sit;Scooting to [facility] Rolling Right: 1: +2 Total assist Rolling Right: Patient Percentage: 30% Right Sidelying to Sit: With rails;HOB elevated (60 degrees) Scooting to HOB: 1: +2 Total assist Scooting to [facility]: Patient Percentage: 20% Details for Bed Mobility Assistance: max sequencing cues with facilitation for trunk rotation and reaching for rail; elevated HOB to bring trunk upright as pt very slow to process information with poor ability to problem solve situation even with cues and large body habitus making transfers difficult; +2totalpt 30% to partially sit up for less than 30 seconds before having to lie back down; max cues for bridging and use of rail to scoot as well as bilateral facilitation to scott HOB with bed in trendeleberg Transfers Transfers: Not assessed    Exercises General Exercises - Lower Extremity Ankle Circles/Pumps: AROM;Both;20 reps;Supine Heel Slides: AAROM;Both;10 reps;Supine Hip  ABduction/ADduction: AAROM;Both;10 reps;Supine     PT Goals Acute Rehab PT Goals PT Goal: Rolling Supine to Right Side - Progress: Progressing toward goal PT Goal: Supine/Side to Sit - Progress: Progressing toward goal PT Goal: Sit at Edge Of Bed - Progress: Progressing toward goal PT Goal: Sit to Supine/Side - Progress: Progressing toward goal  Visit Information  Last PT Received On: 02/13/13 Assistance Needed: +3 or more    Subjective Data  Subjective: Put my hand here? Patient Stated Goal: try to sit on EOB   Cognition  Cognition Overall Cognitive Status: Impaired Area of Impairment: Attention;Following commands;Problem solving Arousal/Alertness: Awake/alert Current Attention Level: Sustained Following Commands: Follows one step commands inconsistently;Follows one step commands with increased time Awareness of Errors: Assistance required to identify errors made Cognition - Other Comments: needs hand over hand demontration/cueing for sequencing of all moblity, needs extra time to process verbal instruction as well as tactile cues    Balance     End of Session PT - End of Session Activity Tolerance: Patient limited by fatigue Patient left: in bed Nurse Communication: Mobility status   GP     [facility] HELEN 02/13/2013, 2:33 PM

## 2013-02-13 NOTE — Progress Notes (Signed)
SUBJECTIVE:  Patient was confused on exam this morning.  Denies any ongoing SOB.  No pain.     PHYSICAL EXAM Filed Vitals:   02/12/13 1951 02/13/13 0257 02/13/13 0406 02/13/13 0900  BP: 146/78 142/80 165/99 131/82  Pulse: 64 74 70 60  Temp: 97.5 F (36.4 C)  98.3 F (36.8 C)   TempSrc: Oral  Oral   Resp: 20 19 20    Height:      Weight:   354 lb (160.573 kg)   SpO2: 93% 98% 93%    General:  No acute distress Lungs:  Decreased breath sounds Heart:  No murmur Abdomen:  Positive bowel sounds, no rebound no guarding Extremities:  Severe edema   LABS: Lab Results  Component Value Date   CKTOTAL 48 11/22/2011   CKMB 1.9 11/22/2011   TROPONINI <0.30 11/22/2011   Results for orders placed during the hospital encounter of 02/06/13 (from the past 24 hour(s))  GLUCOSE, CAPILLARY     Status: Abnormal   Collection Time    02/12/13  4:15 PM      Result Value Range   Glucose-Capillary 179 (*) 70 - 99 mg/dL   Comment 1 Documented in Chart     Comment 2 Notify RN    GLUCOSE, CAPILLARY     Status: Abnormal   Collection Time    02/12/13  9:17 PM      Result Value Range   Glucose-Capillary 159 (*) 70 - 99 mg/dL   Comment 1 Notify RN     Comment 2 Documented in Chart    BASIC METABOLIC PANEL     Status: Abnormal   Collection Time    02/13/13  5:50 AM      Result Value Range   Sodium 139  135 - 145 mEq/L   Potassium 3.0 (*) 3.5 - 5.1 mEq/L   Chloride 89 (*) 96 - 112 mEq/L   CO2 44 (*) 19 - 32 mEq/L   Glucose, Bld 117 (*) 70 - 99 mg/dL   BUN 25 (*) 6 - 23 mg/dL   Creatinine, Ser 1.61  0.50 - 1.35 mg/dL   Calcium 9.2  8.4 - 09.6 mg/dL   GFR calc non Af Amer 65 (*) >90 mL/min   GFR calc Af Amer 76 (*) >90 mL/min  MAGNESIUM     Status: Abnormal   Collection Time    02/13/13  5:50 AM      Result Value Range   Magnesium 1.4 (*) 1.5 - 2.5 mg/dL  GLUCOSE, CAPILLARY     Status: Abnormal   Collection Time    02/13/13  5:55 AM      Result Value Range   Glucose-Capillary 117 (*) 70  - 99 mg/dL  GLUCOSE, CAPILLARY     Status: Abnormal   Collection Time    02/13/13 10:52 AM      Result Value Range   Glucose-Capillary 158 (*) 70 - 99 mg/dL   Comment 1 Notify RN     Comment 2 Documented in Chart      Intake/Output Summary (Last 24 hours) at 02/13/13 1336 Last data filed at 02/13/13 1307  Gross per 24 hour  Intake  759.2 ml  Output   5775 ml  Net -5015.8 ml    ASSESSMENT AND PLAN:  Afib:  Permanent.  Continue pradaxa   Bradycardia:  I reviewed the telemetry.  He seems to be tolerating the current beta blocker.  No prolonged pauses.   Acute on chronic diastolic  dysfunction/ severe pulmonary hypertension with right heart failure:  Continue volume overload.  Wts are inaccurate.  Down 25 liters.  Zaroxolyn discontinued by Dr. Robb Matar.  Continue spironolactone and Lasix drip.   HTN:  This is being managed in the context of treating his CHF   Rollene Rotunda 02/13/2013 1:36 PM

## 2013-02-13 NOTE — Progress Notes (Addendum)
[facility] PROGRESS NOTE Interim History: 62 yo morbidly obese with OSA/OHS (not on positive pressure therapy) sent form NH after found being "less responsive then usual". In ED: Placed on BiPAP, noted to be bradycardic without hypotension.  Filed Weights   02/11/13 0555 02/12/13 0450 02/13/13 0406  Weight: 174.181 kg (384 lb) 168.738 kg (372 lb) 160.573 kg (354 lb)     Assessment/Plan: Acute on chronic diastolic CHF/severe pulmonary hypertension with right heart failure  - On current IV diuresis (lasix, Metolazone add low dose spironolactone.), decrease lasix ggt, d/c metolazone and  follow closely, monitor electrolytes closely. - Replete K and MG as needed.  - continues to be massively vol. Overloaded. - continue lisinopril and coreg. Titrate as tolerate it. - PT consult. - b-met Creatinine stable, HCO increasing. - does not qualify for LTAC. - will need SNF.  Encephalopathy, metabolic - now resolved. - treated with Bipap. - most likely 2/2 to untreated severe OSA.  Acute and chronic respiratory failure/ SLEEP APNEA: - continue O2. - Bipap at night.  Atrial fibrillation/Bradycardia: - Rate controlled. - Cont Pradaxa. - Continue coreg 12.5mg  BID  CORONARY ARTERY DISEASE, S/P PTCA - pradaxa currently asx.  DIABETES MELLITUS, TYPE II - continue SSI.  Essential hypertension, benign: - coreg, lisinopril  Morbid obesity: - counseling   Code Status: full Family Communication: none  Disposition Plan: SNF   Consultants:  cardiology  Procedures:  Echo 2.20.2014: Pulmonary arteries: PA systolic pressure 82-86 mmHg, ejection fraction was 50%. Mild global hypokinesis  Antibiotics:  none (indicate start date, and stop date if known)  HPI/Subjective: No complains.  Objective: Filed Vitals:   02/12/13 1334 02/12/13 1951 02/13/13 0257 02/13/13 0406  BP: 161/88 146/78 142/80 165/99  Pulse: 88 64 74 70  Temp: 97.8 F (36.6 C) 97.5 F (36.4 C)  98.3 F  (36.8 C)  TempSrc: Oral Oral  Oral  Resp: 20 20 19 20   Height:      Weight:    160.573 kg (354 lb)  SpO2: 93% 93% 98% 93%   Filed Weights   02/11/13 0555 02/12/13 0450 02/13/13 0406  Weight: 174.181 kg (384 lb) 168.738 kg (372 lb) 160.573 kg (354 lb)    Intake/Output Summary (Last 24 hours) at 02/13/13 0739 Last data filed at 02/13/13 0557  Gross per 24 hour  Intake  999.2 ml  Output   7175 ml  Net -6175.8 ml   Filed Weights   02/11/13 0555 02/12/13 0450 02/13/13 0406  Weight: 174.181 kg (384 lb) 168.738 kg (372 lb) 160.573 kg (354 lb)    Exam:  General: Alert, awake, oriented x3, in no acute distress.  HEENT: No bruits, no goiter.  Heart: Regular rate and rhythm, without murmurs, rubs, gallops. Massive edema. Lungs: Good air movement, bilateral air movement. [place] Abdomen: Soft, nontender, nondistended, positive bowel sounds.  Neuro: Grossly intact, nonfocal.   Data Reviewed: Basic Metabolic Panel:  Recent Labs Lab 02/07/13 0507  02/08/13 2150 02/09/13 0500 02/10/13 0600 02/10/13 0808 02/11/13 1120 02/12/13 0527 02/12/13 1043 02/13/13 0550  NA 140  < > 141 141 139  --  136 137 139 139  K 5.6*  < > 3.5 3.4* 3.9  --  3.4* 3.5 3.2* 3.0*  CL 103  < > 97 97 96  --  95* 94* 93* 89*  CO2 27  < > 33* 34* 36*  --  34* 37* 39* 44*  GLUCOSE 106*  < > 76 115* 130*  --  140* 175* 168*  117*  BUN 30*  < > 28* 29* 30*  --  28* 27* 25* 25*  CREATININE 1.15  < > 1.12 1.13 1.18  --  1.20 1.19 1.23 1.18  CALCIUM 8.9  < > 8.6 8.9 9.1  --  8.8 9.0 9.4 9.2  MG 1.7  --  1.5 1.3* 2.2 2.2  --  1.6  --   --   PHOS 4.1  --   --   --   --   --   --   --   --   --   < > = values in this interval not displayed. Liver Function Tests:  Recent Labs Lab 02/06/13 1830  AST 19  ALT 9  ALKPHOS 61  BILITOT 0.5  PROT 7.9  ALBUMIN 2.6*   No results found for this basename: LIPASE, AMYLASE,  in the last 168 hours No results found for this basename: AMMONIA,  in the last 168  hours CBC:  Recent Labs Lab 02/06/13 1830 02/07/13 0507 02/08/13 0350 02/09/13 0500  WBC 4.5 8.9 5.5 5.6  NEUTROABS 2.8  --   --   --   HGB 14.0 15.4 13.6 14.0  HCT 43.2 46.9 40.8 43.1  MCV 87.8 88.7 85.9 86.2  PLT 191 178 166 148*   Cardiac Enzymes: No results found for this basename: CKTOTAL, CKMB, CKMBINDEX, TROPONINI,  in the last 168 hours BNP (last 3 results)  Recent Labs  02/06/13 1830  PROBNP 15989.0*   CBG:  Recent Labs Lab 02/12/13 0621 02/12/13 1109 02/12/13 1615 02/12/13 2117 02/13/13 0555  GLUCAP 189* 162* 179* 159* 117*    Recent Results (from the past 240 hour(s))  CULTURE, BLOOD (ROUTINE X 2)     Status: None   Collection Time    02/06/13  6:15 PM      Result Value Range Status   Specimen Description BLOOD ARM LEFT   Final   Special Requests BOTTLES DRAWN AEROBIC ONLY 5CC   Final   Culture  Setup Time 02/07/2013 00:23   Final   Culture     Final   Value:        BLOOD CULTURE RECEIVED NO GROWTH TO DATE CULTURE WILL BE HELD FOR 5 DAYS BEFORE ISSUING A FINAL NEGATIVE REPORT   Report Status PENDING   Incomplete  CULTURE, BLOOD (ROUTINE X 2)     Status: None   Collection Time    02/06/13  6:30 PM      Result Value Range Status   Specimen Description BLOOD HAND LEFT   Final   Special Requests BOTTLES DRAWN AEROBIC ONLY 8CC   Final   Culture  Setup Time 02/07/2013 00:22   Final   Culture     Final   Value:        BLOOD CULTURE RECEIVED NO GROWTH TO DATE CULTURE WILL BE HELD FOR 5 DAYS BEFORE ISSUING A FINAL NEGATIVE REPORT   Report Status PENDING   Incomplete  MRSA PCR SCREENING     Status: Abnormal   Collection Time    02/06/13 11:33 PM      Result Value Range Status   MRSA by PCR POSITIVE (*) NEGATIVE Final   Comment:            The GeneXpert MRSA Assay (FDA     approved for NASAL specimens     only), is one component of a     comprehensive MRSA colonization     surveillance program. It is  not     intended to diagnose MRSA     infection  nor to guide or     monitor treatment for     MRSA infections.     RESULT CALLED TO, READ BACK BY AND VERIFIED WITH:     [facility] RN [id number] AT 0048 SKEEN,P     Studies: No results found.  Scheduled Meds: . carvedilol  12.5 mg Oral BID WC  . dabigatran  150 mg Oral Q12H  . insulin aspart  0-20 Units Subcutaneous TID WC  . lisinopril  40 mg Oral Daily  . magnesium oxide  800 mg Oral BID WC  . metolazone  5 mg Oral Daily  . pantoprazole  40 mg Oral Daily  . potassium chloride  40 mEq Oral TID WC  . spironolactone  12.5 mg Oral Daily   Continuous Infusions: . furosemide (LASIX) infusion 12 mg/hr (02/13/13 0553)     Marinda Elk  [facility] Pager [phone].  If 8PM-8AM, please contact night-coverage at [email or web address], password [facility] 02/13/2013, 7:39 AM  LOS: 7 days

## 2013-02-13 NOTE — Progress Notes (Signed)
Lab called critical CO2 of 44.  MD text/paged.

## 2013-02-13 NOTE — Progress Notes (Signed)
Patient noted to have red tinged urine in foley catheter with increase agitation.  NP Schorr notified.  UA ordered and Haldol ordered to be given x1.  RN will continue to monitor. Louretta Parma, RN

## 2013-02-14 DIAGNOSIS — J9610 Chronic respiratory failure, unspecified whether with hypoxia or hypercapnia: Secondary | ICD-10-CM

## 2013-02-14 LAB — URINALYSIS, ROUTINE W REFLEX MICROSCOPIC
Bilirubin Urine: NEGATIVE
Glucose, UA: NEGATIVE mg/dL
Ketones, ur: NEGATIVE mg/dL
Leukocytes, UA: NEGATIVE
Nitrite: NEGATIVE
Protein, ur: >300 mg/dL — AB
Specific Gravity, Urine: 1.012 (ref 1.005–1.030)
Urobilinogen, UA: 2 mg/dL — ABNORMAL HIGH (ref 0.0–1.0)
pH: 6 (ref 5.0–8.0)

## 2013-02-14 LAB — GLUCOSE, CAPILLARY
Glucose-Capillary: 151 mg/dL — ABNORMAL HIGH (ref 70–99)
Glucose-Capillary: 136 mg/dL — ABNORMAL HIGH (ref 70–99)
Glucose-Capillary: 151 mg/dL — ABNORMAL HIGH (ref 70–99)
Glucose-Capillary: 170 mg/dL — ABNORMAL HIGH (ref 70–99)

## 2013-02-14 LAB — URINE MICROSCOPIC-ADD ON

## 2013-02-14 LAB — BASIC METABOLIC PANEL
Chloride: 88 mEq/L — ABNORMAL LOW (ref 96–112)
Creatinine, Ser: 1.17 mg/dL (ref 0.50–1.35)
GFR calc Af Amer: 77 mL/min — ABNORMAL LOW (ref >90)
Potassium: 3 mEq/L — ABNORMAL LOW (ref 3.5–5.1)

## 2013-02-14 LAB — MAGNESIUM: Magnesium: 1.6 mg/dL (ref 1.5–2.5)

## 2013-02-14 MED ORDER — MAGNESIUM SULFATE 40 MG/ML IJ SOLN
2.0000 g | Freq: Once | INTRAMUSCULAR | Status: AC
  Administered 2013-02-14: 2 g via INTRAVENOUS
  Filled 2013-02-14: qty 50

## 2013-02-14 MED ORDER — FUROSEMIDE 80 MG PO TABS
80.0000 mg | ORAL_TABLET | Freq: Two times a day (BID) | ORAL | Status: DC
  Administered 2013-02-14 (×2): 80 mg via ORAL
  Filled 2013-02-14 (×5): qty 1

## 2013-02-14 MED ORDER — SPIRONOLACTONE 25 MG PO TABS
25.0000 mg | ORAL_TABLET | Freq: Every day | ORAL | Status: DC
  Administered 2013-02-14 – 2013-02-15 (×2): 25 mg via ORAL
  Filled 2013-02-14 (×2): qty 1

## 2013-02-14 MED ORDER — CARVEDILOL 25 MG PO TABS
25.0000 mg | ORAL_TABLET | Freq: Two times a day (BID) | ORAL | Status: DC
  Administered 2013-02-14 – 2013-02-15 (×2): 25 mg via ORAL
  Filled 2013-02-14 (×4): qty 1

## 2013-02-14 MED ORDER — POTASSIUM CHLORIDE CRYS ER 20 MEQ PO TBCR
40.0000 meq | EXTENDED_RELEASE_TABLET | Freq: Three times a day (TID) | ORAL | Status: DC
Start: 2013-02-14 — End: 2013-02-14

## 2013-02-14 MED ORDER — POTASSIUM CHLORIDE CRYS ER 20 MEQ PO TBCR
40.0000 meq | EXTENDED_RELEASE_TABLET | Freq: Two times a day (BID) | ORAL | Status: DC
  Administered 2013-02-14 – 2013-02-15 (×3): 40 meq via ORAL
  Filled 2013-02-14 (×4): qty 2

## 2013-02-14 NOTE — Progress Notes (Signed)
CRITICAL VALUE ALERT  Critical value received:  Co2 = 44  Date of notification:  02/14/2013  Time of notification:  0702  Critical value read back:yes  Nurse who received alert:  Louretta Parma, RN  MD notified (1st page):  David Stall  Time of first page:  0703  MD notified (2nd page):  Time of second page:  Responding MD:  David Stall   Time MD responded:  [phone]

## 2013-02-14 NOTE — Progress Notes (Signed)
Placed pt on CPAP auto titrate, Max 20 min 5. Pt request to go on CPAP when he was ready to go to sleep. Rn called and Pt placed on CPAP at this time.

## 2013-02-14 NOTE — Progress Notes (Signed)
[facility] PROGRESS NOTE Interim History: 62 yo morbidly obese with OSA/OHS (not on positive pressure therapy) sent form NH after found being "less responsive then usual". In ED: Placed on BiPAP, noted to be bradycardic without hypotension.  Filed Weights   02/12/13 0450 02/13/13 0406 02/14/13 0411  Weight: 168.738 kg (372 lb) 160.573 kg (354 lb) 159.213 kg (351 lb)     Assessment/Plan: Acute on chronic diastolic CHF/severe pulmonary hypertension with right heart failure  - transition to lasix orally. monitor electrolytes. Continue spirinolactone. - continue lisinopril and coreg. Increase coreg. - PT consult. - b-met Creatinine stable, HCO stable. - will need SNF.  Encephalopathy, metabolic - now resolved. - treated with Bipap. - most likely 2/2 to untreated severe OSA.  Acute and chronic respiratory failure/ SLEEP APNEA: - continue O2. - Bipap at night.  Atrial fibrillation/Bradycardia: - Rate controlled. - Cont Pradaxa. - Continue coreg 12.5mg  BID  CORONARY ARTERY DISEASE, S/P PTCA - pradaxa currently asx.  DIABETES MELLITUS, TYPE II - continue SSI.  Essential hypertension, benign: - coreg, lisinopril  Morbid obesity: - counseling   Code Status: full Family Communication: none  Disposition Plan: SNF   Consultants:  cardiology  Procedures:  Echo 2.20.2014: Pulmonary arteries: PA systolic pressure 82-86 mmHg, ejection fraction was 50%. Mild global hypokinesis  Antibiotics:  none (indicate start date, and stop date if known)  HPI/Subjective: No complains.  Objective: Filed Vitals:   02/13/13 1355 02/13/13 1418 02/13/13 2226 02/14/13 0411  BP: 134/96  101/77 130/77  Pulse: 56  107 89  Temp: 98 F (36.7 C)  98.1 F (36.7 C) 97.8 F (36.6 C)  TempSrc: Oral  Oral Oral  Resp: 19  18 20   Height:      Weight:    159.213 kg (351 lb)  SpO2: 99% 100% 90% 100%   Filed Weights   02/12/13 0450 02/13/13 0406 02/14/13 0411  Weight: 168.738 kg  (372 lb) 160.573 kg (354 lb) 159.213 kg (351 lb)    Intake/Output Summary (Last 24 hours) at 02/14/13 0831 Last data filed at 02/14/13 0615  Gross per 24 hour  Intake 1398.73 ml  Output   5225 ml  Net -3826.27 ml   Filed Weights   02/12/13 0450 02/13/13 0406 02/14/13 0411  Weight: 168.738 kg (372 lb) 160.573 kg (354 lb) 159.213 kg (351 lb)    Exam:  General: Alert, awake, oriented x3, in no acute distress.  HEENT: No bruits, no goiter.  Heart: Regular rate and rhythm, without murmurs, rubs, gallops. Massive edema. Lungs: Good air movement, bilateral air movement. Abdomen: Soft, nontender, nondistended, positive bowel sounds.  Neuro: Grossly intact, nonfocal. - Skin: lower extremity edema improved.  Data Reviewed: Basic Metabolic Panel:  Recent Labs Lab 02/10/13 0600 02/10/13 0808 02/11/13 1120 02/12/13 0527 02/12/13 1043 02/13/13 0550 02/14/13 0612  NA 139  --  136 137 139 139 138  K 3.9  --  3.4* 3.5 3.2* 3.0* 3.0*  CL 96  --  95* 94* 93* 89* 88*  CO2 36*  --  34* 37* 39* 44* 44*  GLUCOSE 130*  --  140* 175* 168* 117* 148*  BUN 30*  --  28* 27* 25* 25* 25*  CREATININE 1.18  --  1.20 1.19 1.23 1.18 1.17  CALCIUM 9.1  --  8.8 9.0 9.4 9.2 8.9  MG 2.2 2.2  --  1.6  --  1.4* 1.6   Liver Function Tests: No results found for this basename: AST, ALT, ALKPHOS, BILITOT, PROT, ALBUMIN,  in the last 168 hours No results found for this basename: LIPASE, AMYLASE,  in the last 168 hours No results found for this basename: AMMONIA,  in the last 168 hours CBC:  Recent Labs Lab 02/08/13 0350 02/09/13 0500  WBC 5.5 5.6  HGB 13.6 14.0  HCT 40.8 43.1  MCV 85.9 86.2  PLT 166 148*   Cardiac Enzymes: No results found for this basename: CKTOTAL, CKMB, CKMBINDEX, TROPONINI,  in the last 168 hours BNP (last 3 results)  Recent Labs  02/06/13 1830  PROBNP 15989.0*   CBG:  Recent Labs Lab 02/12/13 2117 02/13/13 0555 02/13/13 1052 02/13/13 2215 02/14/13 0554  GLUCAP  159* 117* 158* 179* 151*    Recent Results (from the past 240 hour(s))  CULTURE, BLOOD (ROUTINE X 2)     Status: None   Collection Time    02/06/13  6:15 PM      Result Value Range Status   Specimen Description BLOOD ARM LEFT   Final   Special Requests BOTTLES DRAWN AEROBIC ONLY 5CC   Final   Culture  Setup Time 02/07/2013 00:23   Final   Culture NO GROWTH 5 DAYS   Final   Report Status 02/13/2013 FINAL   Final  CULTURE, BLOOD (ROUTINE X 2)     Status: None   Collection Time    02/06/13  6:30 PM      Result Value Range Status   Specimen Description BLOOD HAND LEFT   Final   Special Requests BOTTLES DRAWN AEROBIC ONLY 8CC   Final   Culture  Setup Time 02/07/2013 00:22   Final   Culture NO GROWTH 5 DAYS   Final   Report Status 02/13/2013 FINAL   Final  MRSA PCR SCREENING     Status: Abnormal   Collection Time    02/06/13 11:33 PM      Result Value Range Status   MRSA by PCR POSITIVE (*) NEGATIVE Final   Comment:            The GeneXpert MRSA Assay (FDA     approved for NASAL specimens     only), is one component of a     comprehensive MRSA colonization     surveillance program. It is not     intended to diagnose MRSA     infection nor to guide or     monitor treatment for     MRSA infections.     RESULT CALLED TO, READ BACK BY AND VERIFIED WITH:     [facility] RN [id number] AT 0048 SKEEN,P     Studies: No results found.  Scheduled Meds: . carvedilol  12.5 mg Oral BID WC  . dabigatran  150 mg Oral Q12H  . DULoxetine  40 mg Oral Daily  . gabapentin  300 mg Oral TID  . haloperidol lactate  5 mg Intramuscular Once  . insulin aspart  0-20 Units Subcutaneous TID WC  . lisinopril  40 mg Oral Daily  . methimazole  10 mg Oral Daily  . pantoprazole  40 mg Oral Daily  . potassium chloride  40 mEq Oral BID  . spironolactone  25 mg Oral Daily   Continuous Infusions: . furosemide (LASIX) infusion 8 mg/hr (02/14/13 2130)     Marinda Elk  [facility] Pager  [phone].  If 8PM-8AM, please contact night-coverage at [email or web address], password [facility] 02/14/2013, 8:31 AM  LOS: 8 days

## 2013-02-14 NOTE — Progress Notes (Signed)
NUTRITION FOLLOW UP  Intervention:   1. No nutrition interventions at this time.  2. RD will continue to follow  Nutrition Dx:   Inadequate oral intake related to acute encephalopathy as evidenced by no % meal intake recorded. Improving with mental status improvement   Goal:   Oral intake to meet >/=90% estimated nutrition needs. Improving   Monitor:   Po intake, weight trends, skin integrity  Assessment:   Still with some confusion per RN, "thinks he is in car". Eating better, 100% for breakfast.  Weight down 67 lbs this admission with fluid status.   Height: Ht Readings from Last 1 Encounters:  02/09/13 5\' 3"  (1.6 m)    Weight Status:   Wt Readings from Last 1 Encounters:  02/14/13 351 lb (159.213 kg)  Weight trending down  Re-estimated needs:  Kcal: 2000-2200 (to promote weight loss) Protein: 100-110 gm  Fluid: 1 L fluid restriction   Skin: intact   Diet Order: Sodium Restricted   Intake/Output Summary (Last 24 hours) at 02/14/13 1000 Last data filed at 02/14/13 0830  Gross per 24 hour  Intake 1748.73 ml  Output   5225 ml  Net -3476.27 ml  -27 L this admission   Last BM: 2/25   Labs:   Recent Labs Lab 02/12/13 0527 02/12/13 1043 02/13/13 0550 02/14/13 0612  NA 137 139 139 138  K 3.5 3.2* 3.0* 3.0*  CL 94* 93* 89* 88*  CO2 37* 39* 44* 44*  BUN 27* 25* 25* 25*  CREATININE 1.19 1.23 1.18 1.17  CALCIUM 9.0 9.4 9.2 8.9  MG 1.6  --  1.4* 1.6  GLUCOSE 175* 168* 117* 148*    CBG (last 3)   Recent Labs  02/13/13 1052 02/13/13 2215 02/14/13 0554  GLUCAP 158* 179* 151*    Scheduled Meds: . carvedilol  25 mg Oral BID WC  . dabigatran  150 mg Oral Q12H  . DULoxetine  40 mg Oral Daily  . furosemide  80 mg Oral BID  . gabapentin  300 mg Oral TID  . haloperidol lactate  5 mg Intramuscular Once  . insulin aspart  0-20 Units Subcutaneous TID WC  . lisinopril  40 mg Oral Daily  . magnesium sulfate 1 - 4 g bolus IVPB  2 g Intravenous Once  .  methimazole  10 mg Oral Daily  . pantoprazole  40 mg Oral Daily  . potassium chloride  40 mEq Oral BID  . spironolactone  25 mg Oral Daily    Continuous Infusions: . furosemide (LASIX) infusion 8 mg/hr (02/14/13 0960)    Clarene Duke RD, LDN Pager [phone] After Hours pager [phone]

## 2013-02-14 NOTE — Progress Notes (Signed)
Received additional request from Dr Robb Matar regarding assistance needed to ensure follow up care for patient at [facility].  Provided MD with direct mobile number for SNF medical director Dr Baltazar Najjar [phone]. Dr Robb Matar indicated that he will collaborate with Dr Leanord Hawking directly reduce the risk of this patient being readmitted.

## 2013-02-15 DIAGNOSIS — J9620 Acute and chronic respiratory failure, unspecified whether with hypoxia or hypercapnia: Secondary | ICD-10-CM

## 2013-02-15 DIAGNOSIS — I4891 Unspecified atrial fibrillation: Secondary | ICD-10-CM

## 2013-02-15 LAB — PRO B NATRIURETIC PEPTIDE: Pro B Natriuretic peptide (BNP): 5669 pg/mL — ABNORMAL HIGH (ref 0–125)

## 2013-02-15 LAB — BASIC METABOLIC PANEL
BUN: 26 mg/dL — ABNORMAL HIGH (ref 6–23)
CO2: >45 mEq/L (ref 19–32)
CO2: 44 mEq/L (ref 19–32)
Calcium: 8.8 mg/dL (ref 8.4–10.5)
Calcium: 9 mg/dL (ref 8.4–10.5)
Chloride: 87 mEq/L — ABNORMAL LOW (ref 96–112)
Creatinine, Ser: 1.39 mg/dL — ABNORMAL HIGH (ref 0.50–1.35)
GFR calc Af Amer: 60 mL/min — ABNORMAL LOW (ref >90)
GFR calc non Af Amer: 52 mL/min — ABNORMAL LOW (ref >90)
Glucose, Bld: 158 mg/dL — ABNORMAL HIGH (ref 70–99)
Sodium: 140 mEq/L (ref 135–145)

## 2013-02-15 LAB — GLUCOSE, CAPILLARY: Glucose-Capillary: 119 mg/dL — ABNORMAL HIGH (ref 70–99)

## 2013-02-15 MED ORDER — METOLAZONE 2.5 MG PO TABS
2.5000 mg | ORAL_TABLET | Freq: Every day | ORAL | Status: DC
  Administered 2013-02-15: 2.5 mg via ORAL
  Filled 2013-02-15: qty 1

## 2013-02-15 MED ORDER — FUROSEMIDE 80 MG PO TABS: 120.0000 mg | ORAL_TABLET | Freq: Every day | ORAL | Status: DC

## 2013-02-15 MED ORDER — FUROSEMIDE 80 MG PO TABS
120.0000 mg | ORAL_TABLET | Freq: Two times a day (BID) | ORAL | Status: DC
  Filled 2013-02-15 (×3): qty 1

## 2013-02-15 MED ORDER — POTASSIUM CHLORIDE CRYS ER 20 MEQ PO TBCR: 40.0000 meq | EXTENDED_RELEASE_TABLET | Freq: Two times a day (BID) | ORAL | Status: DC

## 2013-02-15 MED ORDER — LISINOPRIL 40 MG PO TABS: 40.0000 mg | ORAL_TABLET | Freq: Every day | ORAL | Status: DC

## 2013-02-15 MED ORDER — SPIRONOLACTONE 25 MG PO TABS: 25.0000 mg | ORAL_TABLET | Freq: Every day | ORAL | Status: DC

## 2013-02-15 MED ORDER — FUROSEMIDE 40 MG PO TABS: 80.0000 mg | ORAL_TABLET | Freq: Two times a day (BID) | ORAL | Status: DC

## 2013-02-15 MED ORDER — FUROSEMIDE 40 MG PO TABS: 120.0000 mg | ORAL_TABLET | Freq: Two times a day (BID) | ORAL | Status: DC

## 2013-02-15 NOTE — Progress Notes (Signed)
Per MD- patient is stable for d/c back to SNF- [facility] via EMS.  OK per patient and voice message left for patient's family member- Konrad Dolores.  Notified Angela at [facility] and [employer] summary sent to her.  Patient's nurse Dorann Lodge also notified of scheduled d/c around 1 pm via EMS.  CSW signing off.  Lorri Frederick. [place]  [phone]

## 2013-02-15 NOTE — Discharge Summary (Addendum)
Physician Discharge Summary  [name] ZOX:[id number] DOB: [birth year] DOA: 02/06/2013  PCP: Terald Sleeper, MD  Admit date: 02/06/2013 Discharge date: 02/15/2013  Time spent: 30 minutes  Recommendations for Outpatient Follow-up:  1. Follow up with cardiology as an outpatient  Discharge Diagnoses:  Principal Problem:   Acute on chronic diastolic CHF (congestive heart failure) Active Problems:   Atrial fibrillation   Acute and chronic respiratory failure (acute-on-chronic)   Encephalopathy, metabolic   DIABETES MELLITUS, TYPE II   Morbid obesity   Essential hypertension, benign   CORONARY ARTERY DISEASE, S/P PTCA   CARDIOMYOPATHY, PRIMARY   SLEEP APNEA   Chronic respiratory failure   Bradycardia   Hyperkalemia   Discharge Condition: stable  Diet recommendation: low sodium diet  Filed Weights   02/13/13 0406 02/14/13 0411 02/15/13 0650  Weight: 160.573 kg (354 lb) 159.213 kg (351 lb) 158.4 kg (349 lb 3.3 oz)    History of present illness:  62 yo morbidly obese with OSA/OHS (not on positive pressure therapy) sent form NH after found being "less responsive then usual". In ED: Placed on BiPAP, noted to be bradycardic without hypotension.   Hospital Course:  Acute on chronic diastolic CHF/severe pulmonary hypertension with right heart failure : - he was initially admitted by the critical care M.D. on admission he was started aggressive diuresis, cardiology was consulted. - Started on IV Lasix drip ( good urine out put during his hospital stay he is about 28 L negative) his were followed closely,Replete K and MG as needed. Low dose Metolazone add low dose spironolactone during his hospital stay. Metolazone was DC'd on discharge. He would continue low dose lasix spironolactone as an outpatient. - weight continue to improve from admission ( 189.8 kg) to 158.4 on discharge - continue lisinopril and coreg. Titrate as tolerate it.  - PT consult.  - b-met on 02/16/2013  then every 2 days as an outpatient.  Encephalopathy, metabolic  - now resolved.  - treated with Bipap.  - most likely 2/2 to untreated severe OSA.   Acute and chronic respiratory failure/ SLEEP APNEA:  - continue O2.  - Bipap at night.   Atrial fibrillation/Bradycardia:  - Rate controlled.  - Cont Pradaxa.  - Stop digoxin long term  - Continue coreg 12.5mg  BID   CORONARY ARTERY DISEASE, S/P PTCA  - pradaxa currently asx.   DIABETES MELLITUS, TYPE II  - continue SSI.  - Resume metformin as an outpatient. Once his creatinine returned to baseline 1.1.  Essential hypertension, benign:  - coreg, lisinopril  - Well-controlled.  Morbid obesity:  - counseling   Procedures: Echo 2.20.2014: Pulmonary arteries: PA systolic pressure 82-86 mmHg, ejection fraction was 50%. Mild global hypokinesis  Consultations:  Cardiology  Discharge Exam: Filed Vitals:   02/14/13 1300 02/14/13 1817 02/14/13 2111 02/15/13 0650  BP: 104/68 110/68 106/59 115/82  Pulse: 88 89 75 88  Temp: 98 F (36.7 C)  98.4 F (36.9 C) 98 F (36.7 C)  TempSrc: Oral  Oral Oral  Resp: 20  18 19   Height:      Weight:    158.4 kg (349 lb 3.3 oz)  SpO2: 91%  97% 90%    General: Awake alert and oriented x2 Cardiovascular: Irregular rate and rhythm with positive S1-S2 continues to have a  anasarca Respiratory: Good air movement and clear to auscultation  Discharge Instructions     Medication List    STOP taking these medications       FUROSEMIDE IJ  hydrALAZINE 10 MG tablet  Commonly known as:  APRESOLINE     magnesium oxide 400 MG tablet  Commonly known as:  MAG-OX      TAKE these medications       albuterol (2.5 MG/3ML) 0.083% nebulizer solution  Commonly known as:  PROVENTIL  Take 2.5 mg by nebulization every 2 (two) hours as needed for wheezing or shortness of breath.     carvedilol 25 MG tablet  Commonly known as:  COREG  Take 25 mg by mouth 2 (two) times daily with a meal.      Cholecalciferol 1000 UNITS tablet  Take 1,000 Units by mouth daily.     cloNIDine 0.1 MG tablet  Commonly known as:  CATAPRES  Take 0.1 mg by mouth 2 (two) times daily.     cyanocobalamin 1000 MCG tablet  Take 1,000 mcg by mouth daily.     dabigatran 150 MG Caps  Commonly known as:  PRADAXA  Take 1 capsule (150 mg total) by mouth every 12 (twelve) hours.     digoxin 0.125 MG tablet  Commonly known as:  LANOXIN  Take 0.375 mg by mouth daily.     DULoxetine 20 MG capsule  Commonly known as:  CYMBALTA  Take 40 mg by mouth daily.     ferrous sulfate 325 (65 FE) MG EC tablet  Take 325 mg by mouth daily.     furosemide 40 MG tablet  Commonly known as:  LASIX  Take 2 tablets (80 mg total) by mouth 2 (two) times daily.     gabapentin 300 MG capsule  Commonly known as:  NEURONTIN  Take 300 mg by mouth 3 (three) times daily.     insulin glargine 100 UNIT/ML injection  Commonly known as:  LANTUS  Inject 25 Units into the skin at bedtime.     ipratropium-albuterol 0.5-2.5 (3) MG/3ML Soln  Commonly known as:  DUONEB  Take 3 mLs by nebulization every 6 (six) hours as needed. For shortness of breath or wheezing     ivermectin 3 MG Tabs  Commonly known as:  STROMECTOL  Take 36 mg by mouth once.     lidocaine 5 %  Commonly known as:  LIDODERM  Place 1 patch onto the skin daily. Remove & Discard patch within 12 hours or as directed by MD     lisinopril 40 MG tablet  Commonly known as:  PRINIVIL,ZESTRIL  Take 1 tablet (40 mg total) by mouth daily.     metFORMIN 500 MG tablet  Commonly known as:  GLUCOPHAGE  Take 1,000 mg by mouth 2 (two) times daily with a meal.     methimazole 10 MG tablet  Commonly known as:  TAPAZOLE  Take 10 mg by mouth daily.     omeprazole 20 MG capsule  Commonly known as:  PRILOSEC  Take 20 mg by mouth daily.     polyethylene glycol packet  Commonly known as:  MIRALAX / GLYCOLAX  Take 17 g by mouth at bedtime.     potassium chloride SA 20 MEQ  tablet  Commonly known as:  K-DUR,KLOR-CON  Take 2 tablets (40 mEq total) by mouth 2 (two) times daily.     promethazine 25 MG tablet  Commonly known as:  PHENERGAN  Take 25 mg by mouth every 6 (six) hours as needed for nausea.     sodium chloride 0.65 % nasal spray  Commonly known as:  OCEAN  Place 1 spray into the nose 2 (two)  times daily.     spironolactone 25 MG tablet  Commonly known as:  ALDACTONE  Take 1 tablet (25 mg total) by mouth daily.     THIAMINE PO  Take 10 mg by mouth daily.       Follow-up Information   Follow up with Terald Sleeper, MD In 2 weeks.   Contact information:   [address] [place] [address] [phone]        The results of significant diagnostics from this hospitalization (including imaging, microbiology, ancillary and laboratory) are listed below for reference.    Significant Diagnostic Studies: Dg Chest Port 1 View  02/09/2013  *RADIOLOGY REPORT*  Clinical Data: Pulmonary edema.  Obesity.  PORTABLE CHEST - 1 VIEW  Comparison: Chest 02/08/2013 and 02/07/2013.  Findings: Left IJ catheter remains in place.  Aeration has improved compatible with decreased edema.  Marked cardiomegaly again seen. No pneumothorax.  IMPRESSION: Improved pulmonary edema.   Original Report Authenticated By: Holley Dexter, M.D.    Dg Chest Port 1 View  02/08/2013  *RADIOLOGY REPORT*  Clinical Data: Pulmonary edema.  PORTABLE CHEST - 1 VIEW  Comparison: 02/07/2013  Findings: Portable view of the chest was obtained.  Jugular central venous catheter in the upper SVC region.  Again noted is marked enlargement of the cardiac silhouette. Diffuse parenchymal lung densities are suggestive for edema.  Cannot exclude pleural effusions, particularly on the right side.  IMPRESSION: Minimal change from the previous examination. There is marked enlargement of the cardiac silhouette and evidence for pulmonary edema.  Cannot exclude pleural effusions.   Original Report  Authenticated By: Richarda Overlie, M.D.    Dg Chest Port 1 View  02/07/2013  *RADIOLOGY REPORT*  Clinical Data: Central line placement.  PORTABLE CHEST - 1 VIEW  Comparison: 02/06/2013.  Findings: The left IJ central venous catheter the tip is in the mid SVC at the level of the carina.  No complicating features.  The heart remains enlarged and there is persistent vascular congestion and pulmonary edema but slight improved lung aeration.  No large pleural effusion.  IMPRESSION:  1.  Left IJ catheter in good position without complicating features. 2.  Persistent cardiac enlargement and pulmonary edema but slight interval improved lung aeration.   Original Report Authenticated By: Rudie Meyer, M.D.    Dg Chest Port 1 View  02/06/2013  *RADIOLOGY REPORT*  Clinical Data: CHF  PORTABLE CHEST - 1 VIEW  Comparison: Prior chest x-ray 12/22/2011  Findings: Increased pulmonary vascular congestion now with mild interstitial edema.  Marked enlargement of the cardiopericardial silhouette similar to prior.  Enlarged at venous vascular stripe along the right the tracheal contour.  Small right pleural effusion.  No acute osseous abnormality.  IMPRESSION:  1.  Pulmonary edema and small right effusion consistent with CHF. 2.  Stable cardiomegaly.   Original Report Authenticated By: Malachy Moan, M.D.     Microbiology: Recent Results (from the past 240 hour(s))  CULTURE, BLOOD (ROUTINE X 2)     Status: None   Collection Time    02/06/13  6:15 PM      Result Value Range Status   Specimen Description BLOOD ARM LEFT   Final   Special Requests BOTTLES DRAWN AEROBIC ONLY 5CC   Final   Culture  Setup Time 02/07/2013 00:23   Final   Culture NO GROWTH 5 DAYS   Final   Report Status 02/13/2013 FINAL   Final  CULTURE, BLOOD (ROUTINE X 2)     Status:  None   Collection Time    02/06/13  6:30 PM      Result Value Range Status   Specimen Description BLOOD HAND LEFT   Final   Special Requests BOTTLES DRAWN AEROBIC ONLY 8CC    Final   Culture  Setup Time 02/07/2013 00:22   Final   Culture NO GROWTH 5 DAYS   Final   Report Status 02/13/2013 FINAL   Final  MRSA PCR SCREENING     Status: Abnormal   Collection Time    02/06/13 11:33 PM      Result Value Range Status   MRSA by PCR POSITIVE (*) NEGATIVE Final   Comment:            The GeneXpert MRSA Assay (FDA     approved for NASAL specimens     only), is one component of a     comprehensive MRSA colonization     surveillance program. It is not     intended to diagnose MRSA     infection nor to guide or     monitor treatment for     MRSA infections.     RESULT CALLED TO, READ BACK BY AND VERIFIED WITH:     [facility] RN [id number] AT 0048 SKEEN,P     Labs: Basic Metabolic Panel:  Recent Labs Lab 02/10/13 0600 02/10/13 0808  02/12/13 0527 02/12/13 1043 02/13/13 0550 02/14/13 0612 02/15/13 0603  NA 139  --   < > 137 139 139 138 140  K 3.9  --   < > 3.5 3.2* 3.0* 3.0* 3.3*  CL 96  --   < > 94* 93* 89* 88* 90*  CO2 36*  --   < > 37* 39* 44* 44* >45*  GLUCOSE 130*  --   < > 175* 168* 117* 148* 126*  BUN 30*  --   < > 27* 25* 25* 25* 27*  CREATININE 1.18  --   < > 1.19 1.23 1.18 1.17 1.43*  CALCIUM 9.1  --   < > 9.0 9.4 9.2 8.9 8.8  MG 2.2 2.2  --  1.6  --  1.4* 1.6  --   < > = values in this interval not displayed. Liver Function Tests: No results found for this basename: AST, ALT, ALKPHOS, BILITOT, PROT, ALBUMIN,  in the last 168 hours No results found for this basename: LIPASE, AMYLASE,  in the last 168 hours No results found for this basename: AMMONIA,  in the last 168 hours CBC:  Recent Labs Lab 02/09/13 0500  WBC 5.6  HGB 14.0  HCT 43.1  MCV 86.2  PLT 148*   Cardiac Enzymes: No results found for this basename: CKTOTAL, CKMB, CKMBINDEX, TROPONINI,  in the last 168 hours BNP: BNP (last 3 results)  Recent Labs  02/06/13 1830  PROBNP 15989.0*   CBG:  Recent Labs Lab 02/14/13 0554 02/14/13 1129 02/14/13 1619 02/14/13 2109  02/15/13 0631  GLUCAP 151* 136* 151* 170* 119*     Signed:  Marinda Elk  [facility] 02/15/2013, 8:26 AM

## 2013-02-15 NOTE — Progress Notes (Signed)
    SUBJECTIVE:    Denies any ongoing SOB.  No pain.     PHYSICAL EXAM Filed Vitals:   02/14/13 0929 02/14/13 1300 02/14/13 1817 02/14/13 2111  BP: 132/78 104/68 110/68 106/59  Pulse: 88 88 89 75  Temp:  98 F (36.7 C)  98.4 F (36.9 C)  TempSrc:  Oral  Oral  Resp:  20  18  Height:      Weight:      SpO2:  91%  97%   General:  No acute distress Lungs:  Decreased breath sounds Heart:  No murmur Abdomen:  Positive bowel sounds, no rebound no guarding Extremities:  Severe edema in dependent areas (buttocks and thighs)   LABS: Lab Results  Component Value Date   CKTOTAL 48 11/22/2011   CKMB 1.9 11/22/2011   TROPONINI <0.30 11/22/2011   Results for orders placed during the hospital encounter of 02/06/13 (from the past 24 hour(s))  GLUCOSE, CAPILLARY     Status: Abnormal   Collection Time    02/14/13 11:29 AM      Result Value Range   Glucose-Capillary 136 (*) 70 - 99 mg/dL  GLUCOSE, CAPILLARY     Status: Abnormal   Collection Time    02/14/13  4:19 PM      Result Value Range   Glucose-Capillary 151 (*) 70 - 99 mg/dL   Comment 1 Notify RN    GLUCOSE, CAPILLARY     Status: Abnormal   Collection Time    02/14/13  9:09 PM      Result Value Range   Glucose-Capillary 170 (*) 70 - 99 mg/dL  GLUCOSE, CAPILLARY     Status: Abnormal   Collection Time    02/15/13  6:31 AM      Result Value Range   Glucose-Capillary 119 (*) 70 - 99 mg/dL    Intake/Output Summary (Last 24 hours) at 02/15/13 0651 Last data filed at 02/14/13 1900  Gross per 24 hour  Intake    860 ml  Output   1100 ml  Net   -240 ml    ASSESSMENT AND PLAN:  Afib:  Permanent.  Continue pradaxa   Bradycardia:  I reviewed the telemetry today 02/15/2013.  He seems to be tolerating the current beta blocker.  No prolonged pauses.   Acute on chronic diastolic dysfunction/ severe pulmonary hypertension with right heart failure:  He continues to have severe volume overload.  I will increase the PO Lasix today.   He might need zaroxolyn added.  We will need to watch to potassium closely.    HTN:  This is being managed in the context of treating his CHF   Rollene Rotunda 02/15/2013 6:51 AM

## 2013-03-09 ENCOUNTER — Non-Acute Institutional Stay (SKILLED_NURSING_FACILITY): Payer: Medicaid Other | Admitting: Adult Health

## 2013-03-09 DIAGNOSIS — E0590 Thyrotoxicosis, unspecified without thyrotoxic crisis or storm: Secondary | ICD-10-CM

## 2013-03-09 DIAGNOSIS — G8929 Other chronic pain: Secondary | ICD-10-CM

## 2013-03-09 DIAGNOSIS — D649 Anemia, unspecified: Secondary | ICD-10-CM

## 2013-03-09 DIAGNOSIS — K219 Gastro-esophageal reflux disease without esophagitis: Secondary | ICD-10-CM

## 2013-03-09 DIAGNOSIS — J9620 Acute and chronic respiratory failure, unspecified whether with hypoxia or hypercapnia: Secondary | ICD-10-CM

## 2013-03-09 DIAGNOSIS — F329 Major depressive disorder, single episode, unspecified: Secondary | ICD-10-CM

## 2013-03-09 DIAGNOSIS — I10 Essential (primary) hypertension: Secondary | ICD-10-CM

## 2013-03-09 DIAGNOSIS — I509 Heart failure, unspecified: Secondary | ICD-10-CM

## 2013-03-09 DIAGNOSIS — I4891 Unspecified atrial fibrillation: Secondary | ICD-10-CM

## 2013-03-09 DIAGNOSIS — F32A Depression, unspecified: Secondary | ICD-10-CM

## 2013-03-09 DIAGNOSIS — Z66 Do not resuscitate: Secondary | ICD-10-CM

## 2013-03-09 DIAGNOSIS — I5033 Acute on chronic diastolic (congestive) heart failure: Secondary | ICD-10-CM

## 2013-03-09 DIAGNOSIS — E119 Type 2 diabetes mellitus without complications: Secondary | ICD-10-CM

## 2013-03-11 ENCOUNTER — Non-Acute Institutional Stay (INDEPENDENT_AMBULATORY_CARE_PROVIDER_SITE_OTHER): Payer: Self-pay | Admitting: Internal Medicine

## 2013-03-11 DIAGNOSIS — E1165 Type 2 diabetes mellitus with hyperglycemia: Secondary | ICD-10-CM

## 2013-03-11 DIAGNOSIS — I50810 Right heart failure, unspecified: Secondary | ICD-10-CM

## 2013-03-11 DIAGNOSIS — I509 Heart failure, unspecified: Secondary | ICD-10-CM

## 2013-03-11 DIAGNOSIS — E1129 Type 2 diabetes mellitus with other diabetic kidney complication: Secondary | ICD-10-CM

## 2013-03-11 DIAGNOSIS — E662 Morbid (severe) obesity with alveolar hypoventilation: Secondary | ICD-10-CM

## 2013-03-13 ENCOUNTER — Encounter: Payer: Self-pay | Admitting: Adult Health

## 2013-03-13 DIAGNOSIS — D649 Anemia, unspecified: Secondary | ICD-10-CM | POA: Insufficient documentation

## 2013-03-13 DIAGNOSIS — F329 Major depressive disorder, single episode, unspecified: Secondary | ICD-10-CM | POA: Insufficient documentation

## 2013-03-13 DIAGNOSIS — K219 Gastro-esophageal reflux disease without esophagitis: Secondary | ICD-10-CM | POA: Insufficient documentation

## 2013-03-13 DIAGNOSIS — G8929 Other chronic pain: Secondary | ICD-10-CM | POA: Insufficient documentation

## 2013-03-13 NOTE — Assessment & Plan Note (Signed)
He is presently stable is taking iron 325 mg daily  

## 2013-03-13 NOTE — Assessment & Plan Note (Signed)
Is stable is taking prilosec 20 mg daily  

## 2013-03-13 NOTE — Assessment & Plan Note (Signed)
His pain is being well managed uses lidoderm patch to both lower extremities daily takes neurontin 300 mg three times daily has ultram 50 mg three times daily as needed

## 2013-03-13 NOTE — Assessment & Plan Note (Signed)
He is presently stable is taking lantus 25 units daily takes metformin 1gm twice daily

## 2013-03-13 NOTE — Assessment & Plan Note (Addendum)
Is presently stable is taking pradaxa 150 mg twice daily takes digoxin 0.375 mg daily for rate control

## 2013-03-13 NOTE — Assessment & Plan Note (Signed)
Is stable is taking cymbalta 40 mg daily

## 2013-03-13 NOTE — Progress Notes (Signed)
Patient ID: [name], male   DOB: 1951/01/22, 62 y.o.   MRN: [id number]  Chief Complaint  Patient presents with  . Medical Managment of Chronic Issues   HPI  Acute on chronic diastolic CHF (congestive heart failure) Is presently stable is taking lasix 40 mg twice daily with k+ 2 meq daily no signs of acute exacerbation present   Atrial fibrillation Is presently stable is taking pradaxa 150 mg twice daily takes digoxin 0.375 mg daily for rate control   Depression Is stable is taking cymbalta 40 mg daily   DIABETES MELLITUS, TYPE II He is presently stable is taking lantus 25 units daily takes metformin 1gm twice daily   HYPERTHYROIDISM He is  Presently stable is taking tapazole 10 mg daily   Essential hypertension, benign Blood pressure is stable is taking lisinopril 30 mg daily takes clonidine 0.1 mg twice daily takes coreg 25 mg twice daily   Chronic pain His pain is being well managed uses lidoderm patch to both lower extremities daily takes neurontin 300 mg three times daily has ultram 50 mg three times daily as needed   Anemia He is presently stable is taking iron 325 mg daily   GERD (gastroesophageal reflux disease) Is stable is taking prilosec 20 mg daily   Acute and chronic respiratory failure (acute-on-chronic) No change in status is O2 dependent; uses duoneb every 6 hors as needed    Patient's Medications  New Prescriptions   No medications on file  Previous Medications   ALBUTEROL (PROVENTIL) (2.5 MG/3ML) 0.083% NEBULIZER SOLUTION    Take 2.5 mg by nebulization every 2 (two) hours as needed for wheezing or shortness of breath.   CARVEDILOL (COREG) 25 MG TABLET    Take 25 mg by mouth 2 (two) times daily with a meal.    CHOLECALCIFEROL 1000 UNITS TABLET    Take 1,000 Units by mouth daily.   CLONIDINE (CATAPRES) 0.1 MG TABLET    Take 0.1 mg by mouth 2 (two) times daily.   CYANOCOBALAMIN 1000 MCG TABLET    Take 1,000 mcg by mouth daily.    DABIGATRAN  (PRADAXA) 150 MG CAPS    Take 1 capsule (150 mg total) by mouth every 12 (twelve) hours.   DIGOXIN (LANOXIN) 0.125 MG TABLET    Take 0.375 mg by mouth daily.   DULOXETINE (CYMBALTA) 20 MG CAPSULE    Take 40 mg by mouth daily.   FERROUS SULFATE 325 (65 FE) MG EC TABLET    Take 325 mg by mouth daily.    FUROSEMIDE (LASIX) 40 MG TABLET    Take 2 tablets (80 mg total) by mouth 2 (two) times daily.   GABAPENTIN (NEURONTIN) 300 MG CAPSULE    Take 300 mg by mouth 3 (three) times daily.   INSULIN GLARGINE (LANTUS) 100 UNIT/ML INJECTION    Inject 25 Units into the skin at bedtime.    IPRATROPIUM-ALBUTEROL (DUONEB) 0.5-2.5 (3) MG/3ML SOLN    Take 3 mLs by nebulization every 6 (six) hours as needed. For shortness of breath or wheezing   IVERMECTIN (STROMECTOL) 3 MG TABS    Take 36 mg by mouth once.   LIDOCAINE (LIDODERM) 5 %    Place 1 patch onto the skin daily. Remove & Discard patch within 12 hours or as directed by MD   LISINOPRIL (PRINIVIL,ZESTRIL) 40 MG TABLET    Take 1 tablet (40 mg total) by mouth daily.   MAGNESIUM OXIDE (MAG-OX) 400 MG TABLET    Take 400 mg  by mouth 2 (two) times daily.   METFORMIN (GLUCOPHAGE) 500 MG TABLET    Take 1,000 mg by mouth 2 (two) times daily with a meal.   METHIMAZOLE (TAPAZOLE) 10 MG TABLET    Take 10 mg by mouth daily.    OMEPRAZOLE (PRILOSEC) 20 MG CAPSULE    Take 20 mg by mouth daily.   POLYETHYLENE GLYCOL (MIRALAX / GLYCOLAX) PACKET    Take 17 g by mouth at bedtime.    PROMETHAZINE (PHENERGAN) 25 MG TABLET    Take 25 mg by mouth every 6 (six) hours as needed for nausea.    SODIUM CHLORIDE (OCEAN) 0.65 % NASAL SPRAY    Place 1 spray into the nose 2 (two) times daily.   SPIRONOLACTONE (ALDACTONE) 25 MG TABLET    Take 1 tablet (25 mg total) by mouth daily.   THIAMINE HCL (THIAMINE PO)    Take 10 mg by mouth daily.   TRAMADOL (ULTRAM) 50 MG TABLET    Take 50 mg by mouth every 8 (eight) hours as needed for pain.  Modified Medications   Modified Medication Previous  Medication   POTASSIUM CHLORIDE SA (K-DUR,KLOR-CON) 20 MEQ TABLET potassium chloride SA (K-DUR,KLOR-CON) 20 MEQ tablet      Take 20 mEq by mouth 2 (two) times daily.    Take 2 tablets (40 mEq total) by mouth 2 (two) times daily.  Discontinued Medications   No medications on file    Review of Systems  Constitutional: Negative.   Respiratory: Positive for shortness of breath. Negative for cough and wheezing.   Cardiovascular: Negative for chest pain and leg swelling.  Gastrointestinal: Negative for heartburn, abdominal pain and constipation.  Musculoskeletal: Negative for back pain and joint pain.  Skin: Negative.   Psychiatric/Behavioral: Negative.    Filed Vitals:   03/13/13 1513  BP: 148/82  Pulse: 66  Temp: 97.4 F (36.3 C)  Resp: 22  Height: 5\' 11"  (1.803 m)  Weight: 343 lb (155.584 kg)   Physical Exam  Vitals reviewed. Constitutional: He is oriented to person, place, and time. He appears well-developed and well-nourished.  Neck: Neck supple.  Cardiovascular: Normal rate, normal heart sounds and intact distal pulses.   Pulmonary/Chest: Effort normal and breath sounds normal.  Abdominal: Soft. Bowel sounds are normal.  Musculoskeletal: Normal range of motion.  Neurological: He is alert and oriented to person, place, and time.  Skin: Skin is warm and dry.   Lab reviewed: 01-31-13: wbc 4.0; hgb 132.; hct 40.1; mcv 84.2; plt 228; glucose 72; bun 17;creat 0.96; k+ 3.9;  na++ 142; t.bili 0.7; alk phos 66; ast 12; alt <8; albumin 2.8  02-21-13: glucose 98; ubn 41; creat 1.63; k+ 3.3; na++ 140; mag 1.7; tsh 3.0 03-09-13: wbc 4.6; hgb 12.5; hct 37.8; mcv 83.4; plt 183; glucose 91; bun 15; creat 0.69; k+ 3.6; Na++138; t. Bili 0.5; alk phos 73; ast 14; alt 9; albumin 2.9; ca++ 7.9; mag 1.0   ASSESSMENT/PLAN:  Will not make changes in his regimen and plan of care at this time; will continue to monitor his status and will further adjustments as indicated.

## 2013-03-13 NOTE — Assessment & Plan Note (Addendum)
Blood pressure is stable is taking lisinopril 30 mg daily takes clonidine 0.1 mg twice daily takes coreg 25 mg twice daily

## 2013-03-13 NOTE — Assessment & Plan Note (Signed)
No change in status is O2 dependent; uses duoneb every 6 hors as needed

## 2013-03-13 NOTE — Assessment & Plan Note (Signed)
Is presently stable is taking lasix 40 mg twice daily with k+ 2 meq daily no signs of acute exacerbation present

## 2013-03-13 NOTE — Assessment & Plan Note (Signed)
He is  Presently stable is taking tapazole 10 mg daily

## 2013-03-14 NOTE — Progress Notes (Signed)
Patient ID: [name], male   DOB: February 24, 1951, 62 y.o.   MRN: [id number]            DATE:  03/08/2013  FACILITY: [facility]    LEVEL OF CARE: SNF   CHIEF COMPLAINT:  Follow up CHF.    HISTORY OF PRESENT ILLNESS:  This is a patient who was transferred here from another nursing home.  He was here for a few days and then was back in [facility] earlier this month with right ventricular heart failure, obstructive obesity hypoventilation syndrome, extreme pulmonary hypertension.  He was sent out on a higher dose of lasix.  His ejection fraction on an echocardiogram was 50% with mild hypokinesis, however.  He has extreme pulmonary hypertension with pulmonary systolic pressure in the 80s.     REVIEW OF SYSTEMS:   CHEST/RESPIRATORY:  When I went in the room, he was asleep without his oxygen or his CPAP.  Nevertheless, he does not complain of shortness of breath.   CARDIAC:   No chest pain.    GI:  No nausea,  vomiting or abdominal pain.     PHYSICAL EXAMINATION: VITAL SIGNS:   O2 SATURATIONS:  90% on room air.   PULSE:  72 and regular.   GENERAL APPEARANCE:  The patient is lying flat in bed in no distress.  CHEST/RESPIRATORY:  Very shallow air entry, I think mostly due to his size.   CARDIOVASCULAR:  CARDIAC: Heart sounds are distant, but without overt evidence of right ventricular failure.  GASTROINTESTINAL:   ABDOMEN: Morbidly obese, but no tenderness.   CIRCULATION:   EDEMA/VARICOSITIES:  He has no evidence of a DVT.    ASSESSMENT/PLAN:  Severe pulmonary hypertension (            ).  Likely secondary to obesity hypoventilation syndrome.  I tried to talk to him today about CPAP at night and when he is likely to fall asleep during the day.  I think his compliance here is probably marginal.  Severe pulmonary hypertension is poor prognostics.  Recent admission with right ventricular heart failure (            ).  It is a bit difficult to follow this at the bedside, although he  appears to be stable.    Chronic renal insufficiency (            ).  Last BUN was 41, creatinine 1.63 on 02/21/2013.   Hyperthyroidism (            ).  On methimazole.  Free T3 and free T4 were both within the normal range on 02/21/2013.  At some point in time, I will consider a taper of the methimazole.  However, I will not do that now.    Type 2 diabetes (            ).  He is on insulin and metformin.  I will check a  hemoglobin A1C on him early next month.       ADDENDUM:  He does have a Foley catheter in place.  I am totally sure that it is safe to remove this.  I seem to remember an issue in the hospital with placement of this, although skimming through these records, I cannot put my finger on it.  I am going to leave this for now.

## 2013-04-20 ENCOUNTER — Non-Acute Institutional Stay (SKILLED_NURSING_FACILITY): Payer: Medicaid Other | Admitting: Adult Health

## 2013-04-20 DIAGNOSIS — I259 Chronic ischemic heart disease, unspecified: Secondary | ICD-10-CM

## 2013-04-20 DIAGNOSIS — I4891 Unspecified atrial fibrillation: Secondary | ICD-10-CM

## 2013-04-20 DIAGNOSIS — E119 Type 2 diabetes mellitus without complications: Secondary | ICD-10-CM

## 2013-04-20 DIAGNOSIS — I10 Essential (primary) hypertension: Secondary | ICD-10-CM

## 2013-04-20 DIAGNOSIS — M25519 Pain in unspecified shoulder: Secondary | ICD-10-CM

## 2013-04-20 DIAGNOSIS — K219 Gastro-esophageal reflux disease without esophagitis: Secondary | ICD-10-CM

## 2013-04-20 DIAGNOSIS — D649 Anemia, unspecified: Secondary | ICD-10-CM

## 2013-04-20 DIAGNOSIS — I5022 Chronic systolic (congestive) heart failure: Secondary | ICD-10-CM

## 2013-04-20 DIAGNOSIS — E0590 Thyrotoxicosis, unspecified without thyrotoxic crisis or storm: Secondary | ICD-10-CM

## 2013-04-20 DIAGNOSIS — J9610 Chronic respiratory failure, unspecified whether with hypoxia or hypercapnia: Secondary | ICD-10-CM

## 2013-04-20 DIAGNOSIS — F329 Major depressive disorder, single episode, unspecified: Secondary | ICD-10-CM

## 2013-05-29 ENCOUNTER — Non-Acute Institutional Stay (SKILLED_NURSING_FACILITY): Payer: Medicaid Other | Admitting: Internal Medicine

## 2013-05-29 DIAGNOSIS — E1129 Type 2 diabetes mellitus with other diabetic kidney complication: Secondary | ICD-10-CM

## 2013-05-29 DIAGNOSIS — I509 Heart failure, unspecified: Secondary | ICD-10-CM

## 2013-05-29 DIAGNOSIS — I50810 Right heart failure, unspecified: Secondary | ICD-10-CM

## 2013-05-29 DIAGNOSIS — E1165 Type 2 diabetes mellitus with hyperglycemia: Secondary | ICD-10-CM

## 2013-05-29 DIAGNOSIS — E662 Morbid (severe) obesity with alveolar hypoventilation: Secondary | ICD-10-CM

## 2013-06-08 ENCOUNTER — Non-Acute Institutional Stay (SKILLED_NURSING_FACILITY): Payer: Medicaid Other | Admitting: Adult Health

## 2013-06-08 DIAGNOSIS — G8929 Other chronic pain: Secondary | ICD-10-CM

## 2013-06-08 DIAGNOSIS — J9610 Chronic respiratory failure, unspecified whether with hypoxia or hypercapnia: Secondary | ICD-10-CM

## 2013-06-08 DIAGNOSIS — I10 Essential (primary) hypertension: Secondary | ICD-10-CM

## 2013-06-08 DIAGNOSIS — E785 Hyperlipidemia, unspecified: Secondary | ICD-10-CM

## 2013-06-08 DIAGNOSIS — K219 Gastro-esophageal reflux disease without esophagitis: Secondary | ICD-10-CM

## 2013-06-08 DIAGNOSIS — E0590 Thyrotoxicosis, unspecified without thyrotoxic crisis or storm: Secondary | ICD-10-CM

## 2013-06-08 DIAGNOSIS — I5022 Chronic systolic (congestive) heart failure: Secondary | ICD-10-CM

## 2013-06-08 DIAGNOSIS — I4891 Unspecified atrial fibrillation: Secondary | ICD-10-CM

## 2013-06-14 NOTE — Progress Notes (Signed)
Patient ID: [name], male   DOB: 05-10-1951, 62 y.o.   MRN: [id number]           PROGRESS NOTE  DATE:  05/29/2013  FACILITY: [facility]   LEVEL OF CARE:   SNF   Acute Visit   CHIEF COMPLAINT:  Follow up CHF and other issues.    HISTORY OF PRESENT ILLNESS:  [name] is a gentleman who was transferred here from another nursing home in [facility].  When he first arrived here, he was felt to have a peripheral neuropathy of uncertain etiology.  He also had abdominal pain with an ileus and hypokalemia.  He was subsequently readmitted to hospital in early March with congestive heart failure and  respiratory failure.  His  respiratory failure required BiPAP.    PAST MEDICAL HISTORY:  Type 2 diabetes.   Atrial fibrillation.    Hypertension.    Constipation.   Depression.    Congestive heart failure.    Osteoarthritis.   Hyperthyroidism, on methimazole.    REVIEW OF SYSTEMS:   CHEST/RESPIRATORY:   He is not complaining of shortness of breath.   CARDIAC:   He is not complaining of chest pain.   GI:  He has not had any nausea or vomiting or change in bowel habits.  He has  apparently complained to the nurses that he has lower abdominal pain when coughing.   GU:  He now has a Foley catheter in place that was just replaced yesterday.  I do not have any of this data.    PHYSICAL EXAMINATION:   VITAL SIGNS:   PULSE:  Varies between 58 and 63.  He appears AFib.   GENERAL APPEARANCE:  The patient is lying flat in bed.  He is not in any distress.   CHEST/RESPIRATORY:  Shallow, but otherwise clear air entry bilaterally.  CARDIOVASCULAR:  CARDIAC:   Other than the AFib:  No murmurs.  No S3.  No increase in jugular venous pressure.   GASTROINTESTINAL:  ABDOMEN:   Obese.   LIVER/SPLEEN/KIDNEYS:  No liver, no spleen.  No tenderness.   HERNIA:  I attempted to check him for hernia, but did not find any with coughing.  GENITOURINARY:  BLADDER:    He now has a Foley catheter in  place.  NEUROLOGICAL:    SENSATION/STRENGTH:  Lower extremity weakness, said to be secondary to an undiagnosed peripheral neuropathy.  He also has L-spine disease, although this was not felt to be causing him lower extremity difficulties.    ASSESSMENT/PLAN:  Obesity hypoventilation syndrome with right-sided heart failure.  He is supposed to be on CPAP, at least according to my notes from two months ago, although I do not see this in his room.  He may need an outpatient sleep study.    Atrial fibrillation.  He is on Coreg, digoxin, and Pradaxa for stroke prophylaxis.    Right greater than left heart failure.  Any degree of this that he had when he came in is considerably better.    Hyperthyroidism.  On methimazole.  I will check a free T3, free T4, and TSH level on him.    Urinary retention.  I really do not know very much about this other than he had a Foley catheter replaced yesterday.    Type 2 diabetes.  On insulin.  We will check a  hemoglobin A1C.    Low vitamin D level.  He is on 1000 mcg of vitamin D3.  I will recheck this  level.    Peripheral neuropathy.  Extensively worked up in [facility] in 2013.  If there was a diagnosis, it was never really supplied.    CPT CODE: 16109

## 2013-07-10 ENCOUNTER — Non-Acute Institutional Stay (SKILLED_NURSING_FACILITY): Payer: Medicaid Other | Admitting: Internal Medicine

## 2013-07-10 DIAGNOSIS — E0590 Thyrotoxicosis, unspecified without thyrotoxic crisis or storm: Secondary | ICD-10-CM

## 2013-07-10 DIAGNOSIS — E079 Disorder of thyroid, unspecified: Secondary | ICD-10-CM

## 2013-07-10 NOTE — Progress Notes (Signed)
Patient ID: [name], male   DOB: [birth year], 62 y.o.   MRN: [id number] [facility]. Chief complaint; followup hyperthyroidism. History; this is a patient who transferred here from another nursing home earlier this year. He came here on methimazole 10 mg a day. His hormone levels both free T4 and free T3 have been within the normal range consistently. Most recently on July 7. His free T4 was 1.42, free T3-3 0.33, both of these in the middle of the normal range. However, he has remained with isolated suppression of his TSH at 0.010.. I. am really unable to determine any more history than this. Notes from [facility] from 2012. Did not list this as a diagnosis nor can I see that he was on methimazole at that time. Interestingly, in my research on this. I was able to find an ultrasound of the thyroid from 2012 showing a complex solid cystic mass in the lower left pole of the left thyroid measuring 3.5 x 2.3 x 2.3, as well as nodules in the right thyroid med measuring 1.5 x 0.6 x 1.1. The only relevant piece of history from a review of his records, is the fact that he has atrial fibrillation.  Review of systems; HEENT he denies vocal hoarseness.. he does not remember anything about a thyroid ultrasound biopsy. Knows that he is on thyroid medication, but does not remember who started this. Respiratory no shortness of breath  Cardiac no chest pain.  Physical exam. Neck there no masses present. I don't feel his thyroid gland. Cardiac atrial fibrillation. No murmurs. Respiratory clear entry bilaterally.  Impressions/plan #1 hyperthyroidism on methimazole. The pattern of normal hormone levels and a suppression of the TSH has been consistent since his admission here, which was I believe in early March late February of this year. I wonder if the diagnosis was accurate. I am going to gradually taper the methimazole and follow his hormone responses. 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 I have no idea why this was done nor who ordered this. The reasonable approach here would be to repeat his thyroid ultrasound, especially to follow the left-sided mass. Unfortunately, if there is more history, here I'm not really sure how to get at this

## 2013-07-11 ENCOUNTER — Other Ambulatory Visit (HOSPITAL_BASED_OUTPATIENT_CLINIC_OR_DEPARTMENT_OTHER): Payer: Self-pay | Admitting: Internal Medicine

## 2013-07-11 DIAGNOSIS — E049 Nontoxic goiter, unspecified: Secondary | ICD-10-CM

## 2013-07-11 DIAGNOSIS — E079 Disorder of thyroid, unspecified: Secondary | ICD-10-CM

## 2013-07-13 ENCOUNTER — Ambulatory Visit (HOSPITAL_COMMUNITY)
Admission: RE | Admit: 2013-07-13 | Discharge: 2013-07-13 | Disposition: A | Discharge status: Home / Self Care | Payer: Medicaid Other | Source: Ambulatory Visit | Attending: Internal Medicine | Admitting: Internal Medicine

## 2013-07-13 DIAGNOSIS — E041 Nontoxic single thyroid nodule: Secondary | ICD-10-CM | POA: Insufficient documentation

## 2013-07-13 DIAGNOSIS — E079 Disorder of thyroid, unspecified: Secondary | ICD-10-CM | POA: Insufficient documentation

## 2013-07-16 ENCOUNTER — Encounter: Payer: Self-pay | Admitting: Internal Medicine

## 2013-07-23 ENCOUNTER — Non-Acute Institutional Stay (SKILLED_NURSING_FACILITY): Payer: Medicaid Other | Admitting: Adult Health

## 2013-07-23 DIAGNOSIS — K219 Gastro-esophageal reflux disease without esophagitis: Secondary | ICD-10-CM

## 2013-07-23 DIAGNOSIS — I10 Essential (primary) hypertension: Secondary | ICD-10-CM

## 2013-07-23 DIAGNOSIS — E119 Type 2 diabetes mellitus without complications: Secondary | ICD-10-CM

## 2013-07-23 DIAGNOSIS — I5022 Chronic systolic (congestive) heart failure: Secondary | ICD-10-CM

## 2013-07-23 DIAGNOSIS — N400 Enlarged prostate without lower urinary tract symptoms: Secondary | ICD-10-CM

## 2013-07-23 DIAGNOSIS — G8929 Other chronic pain: Secondary | ICD-10-CM

## 2013-07-23 DIAGNOSIS — D649 Anemia, unspecified: Secondary | ICD-10-CM

## 2013-07-23 DIAGNOSIS — E0590 Thyrotoxicosis, unspecified without thyrotoxic crisis or storm: Secondary | ICD-10-CM

## 2013-07-23 DIAGNOSIS — F329 Major depressive disorder, single episode, unspecified: Secondary | ICD-10-CM

## 2013-07-23 DIAGNOSIS — I4891 Unspecified atrial fibrillation: Secondary | ICD-10-CM

## 2013-07-23 DIAGNOSIS — R32 Unspecified urinary incontinence: Secondary | ICD-10-CM | POA: Insufficient documentation

## 2013-07-23 HISTORY — DX: Benign prostatic hyperplasia without lower urinary tract symptoms: N40.0

## 2013-09-06 ENCOUNTER — Encounter: Payer: Self-pay | Admitting: Adult Health

## 2013-09-06 NOTE — Assessment & Plan Note (Signed)
He is stable will no make changes will monitor his status

## 2013-09-06 NOTE — Assessment & Plan Note (Signed)
He is stable will continue digoxin 0.375 mg daily for rate control and will continue pradaxa 150 mg twice daily for anticoagulation and will monitor his status

## 2013-09-06 NOTE — Assessment & Plan Note (Signed)
Will continue iron daily 

## 2013-09-06 NOTE — Progress Notes (Signed)
Patient ID: [name], male   DOB: 11-07-1951, 62 y.o.   MRN: [id number]  [place]  No Known Allergies   Chief Complaint  Patient presents with  . Medical Managment of Chronic Issues    HPI: He is being seen for the management of his chronic illnesses. He was treated for unit uti in April of this year which as resolved. There are no concerns being voiced by the nursing staff at this time he is not voicing any complaints at this time.   Past Medical History  Diagnosis Date  . 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 He had markedly elevated LV filling pressures and pulmonary venous hypertension with normal pulmonary vascular resistance suggesting he will normalize his pulmonary pressures with adequate diuresis.   . Renal insufficiency   . Sleep apnea   . Morbid obesity   . A-fib   . Chronic diastolic heart failure     Echo 8.25.2011:  Mod LVH; EF 50%; Mod LAE; RV dilation and ? dysfxn; mild RAE;   . Other primary cardiomyopathies   . Dyslipidemia   . Essential hypertension, benign   . Diabetes mellitus without mention of complication     Past Surgical History  Procedure Laterality Date  . None      VITAL SIGNS BP 148/82  Pulse 66  Ht 5\' 11"  (1.803 m)  Wt 327 lb (148.326 kg)  BMI 45.63 kg/m2   Patient's Medications  New Prescriptions   No medications on file  Previous Medications   ALBUTEROL (PROVENTIL) (2.5 MG/3ML) 0.083% NEBULIZER SOLUTION    Take 2.5 mg by nebulization every 2 (two) hours as needed for wheezing or shortness of breath.   CARVEDILOL (COREG) 25 MG TABLET    Take 25 mg by mouth 2 (two) times daily with a meal.    CHOLECALCIFEROL 1000 UNITS TABLET    Take 1,000 Units by mouth daily.   CLONIDINE (CATAPRES) 0.1 MG TABLET    Take 0.1 mg by mouth 2 (two) times daily.   CYANOCOBALAMIN 1000 MCG TABLET    Take 1,000 mcg by mouth daily.    DABIGATRAN (PRADAXA) 150 MG CAPS    Take 1 capsule (150 mg total) by mouth every 12 (twelve) hours.   DIGOXIN (LANOXIN) 0.125 MG TABLET    Take 0.375 mg by mouth daily.   DULOXETINE (CYMBALTA) 20 MG CAPSULE    Take 40 mg by mouth daily.   FERROUS SULFATE 325 (65 FE) MG EC TABLET    Take 325 mg by mouth daily.    FUROSEMIDE (LASIX) 40 MG TABLET    Take 2 tablets (80 mg total) by mouth 2 (two) times daily.   GABAPENTIN (NEURONTIN) 300 MG CAPSULE    Take 300 mg by mouth 3 (three) times daily.   INSULIN GLARGINE (LANTUS) 100 UNIT/ML INJECTION    Inject 25 Units into the skin at bedtime.    IPRATROPIUM-ALBUTEROL (DUONEB) 0.5-2.5 (3) MG/3ML SOLN    Take 3 mLs by nebulization every 6 (six) hours as needed. For shortness of breath or wheezing   IVERMECTIN (STROMECTOL) 3 MG TABS    Take 36 mg by mouth once.   LIDOCAINE (LIDODERM) 5 %    Place 1 patch onto the skin daily. Remove & Discard patch within 12 hours or as directed by MD   LISINOPRIL (PRINIVIL,ZESTRIL) 40 MG TABLET    Take 1 tablet (40 mg total) by mouth  daily.   MAGNESIUM OXIDE (MAG-OX) 400 MG TABLET    Take 400 mg by mouth 2 (two) times daily.   METFORMIN (GLUCOPHAGE) 500 MG TABLET    Take 1,000 mg by mouth 2 (two) times daily with a meal.   METHIMAZOLE (TAPAZOLE) 10 MG TABLET    Take 10 mg by mouth daily.    OMEPRAZOLE (PRILOSEC) 20 MG CAPSULE    Take 20 mg by mouth daily.   POLYETHYLENE GLYCOL (MIRALAX / GLYCOLAX) PACKET    Take 17 g by mouth at bedtime.    POTASSIUM CHLORIDE SA (K-DUR,KLOR-CON) 20 MEQ TABLET    Take 20 mEq by mouth 2 (two) times daily.   PROMETHAZINE (PHENERGAN) 25 MG TABLET    Take 25 mg by mouth every 6 (six) hours as needed for nausea.    SODIUM CHLORIDE (OCEAN) 0.65 % NASAL SPRAY    Place 1 spray into the nose 2 (two) times daily.   SPIRONOLACTONE (ALDACTONE) 25 MG TABLET    Take 1 tablet (25 mg total) by mouth daily.   THIAMINE HCL (THIAMINE PO)    Take 100 mg by mouth daily.    TRAMADOL (ULTRAM) 50 MG TABLET    Take  50 mg by mouth every 8 (eight) hours as needed for pain.  Modified Medications   No medications on file  Discontinued Medications   No medications on file    SIGNIFICANT DIAGNOSTIC EXAMS    LABS REVIEWED:  01-31-13: wbc 4.0; hgb 13.2; hct 40.1; mcv 84.2 plt 228; glucose 72; bun 17; creat 0.96; k+3.9; na++140 Liver normal albumin 2.8; chol 148; ldl 109; trig 66; hgb a1c 5.6; dig 1.6; vit d 23 02-21-13: glucose 98; bun 41; creat 1.63; k+3.3;na++ 140; mag 1.7; free t3 3.0  03-09-13: wbc 4.6; hgb 12.5; hct 37.8;mcv 83.4 ;plt 183; glucose 91; bun 15; creat 0.69; k+3.6; na++138 Liver normal albumin 2.9; ca++ 7.9; mag 1.9 03-23-13: urine culture: p. Mirabilis: septra    Review of Systems  Constitutional: Negative for malaise/fatigue.  Respiratory: Negative for cough and shortness of breath.   Cardiovascular: Negative for chest pain and palpitations.  Gastrointestinal: Negative for heartburn, abdominal pain and constipation.  Musculoskeletal: Negative for myalgias and back pain.  Skin: Negative.   Neurological: Negative for headaches.  Psychiatric/Behavioral: Negative for depression. The patient is not nervous/anxious.     Physical Exam  Constitutional: He appears well-developed and well-nourished.  Morbidly obese  Neck: Neck supple. No JVD present. No thyromegaly present.  Cardiovascular: Normal rate and regular rhythm.   Respiratory: Effort normal and breath sounds normal. No respiratory distress.  GI: Soft. Bowel sounds are normal. He exhibits no distension. There is no tenderness.  Genitourinary:  Has foley  Musculoskeletal: He exhibits no edema.  Limites range of motion due to obesity  Neurological: He is alert.  Skin: Skin is dry.  Psychiatric: He has a normal mood and affect.       ASSESSMENT/ PLAN:  Essential hypertension, benign His blood pressure is elevated will increase the clonidine to 0.2 mg twice daily will continue lisinopril 40 mg daily; coreg 25 mg twice daily  and will monitor   CORONARY ARTERY DISEASE, S/P PTCA He is stable will no make changes will monitor his status   Chronic systolic heart failure Is stable will continue lasix 80 mg twice daily with k+ 40 meq twice daily; coreg 25 mg twice daily and aldactone 25 mg daily  and will monitor his status   Atrial fibrillation He is  stable will continue digoxin 0.375 mg daily for rate control and will continue pradaxa 150 mg twice daily for anticoagulation and will monitor his status   Chronic respiratory failure His respiratory status is stable will continue albuterol neb treatments every 2 hours as needed and duoneb treatments every 6 hours as needed and will monitor   GERD (gastroesophageal reflux disease) Will continue prilosec 20 mg daily   HYPERTHYROIDISM He is presently stable will continue tapazole 10 mg daily and will monitor   DIABETES MELLITUS, TYPE II His status is stable will continue lantus 25 units nightly and metformin 1 gm twice daily   SHOULDER PAIN, BILATERAL His pain is managed is using a lidoderm patch and is taking neurontin 300 mg three times daily will monitor    Depression Is presently stable will continue cymbalta 40 mg daily and will monitor his emotional well being.   Anemia Will continue iron daily    Will check a mag level

## 2013-09-06 NOTE — Assessment & Plan Note (Signed)
Is presently stable will continue cymbalta 40 mg daily and will monitor his emotional well being.

## 2013-09-06 NOTE — Assessment & Plan Note (Signed)
His status is stable will continue lantus 25 units nightly and metformin 1 gm twice daily

## 2013-09-06 NOTE — Assessment & Plan Note (Signed)
His pain is managed is using a lidoderm patch and is taking neurontin 300 mg three times daily will monitor

## 2013-09-06 NOTE — Assessment & Plan Note (Addendum)
Is stable will continue lasix 80 mg twice daily with k+ 40 meq twice daily; coreg 25 mg twice daily and aldactone 25 mg daily  and will monitor his status

## 2013-09-06 NOTE — Assessment & Plan Note (Signed)
He is presently stable will continue tapazole 10 mg daily and will monitor

## 2013-09-06 NOTE — Assessment & Plan Note (Signed)
Will continue prilosec 20 mg daily  

## 2013-09-06 NOTE — Assessment & Plan Note (Signed)
His respiratory status is stable will continue albuterol neb treatments every 2 hours as needed and duoneb treatments every 6 hours as needed and will monitor

## 2013-09-06 NOTE — Assessment & Plan Note (Signed)
His blood pressure is elevated will increase the clonidine to 0.2 mg twice daily will continue lisinopril 40 mg daily; coreg 25 mg twice daily and will monitor

## 2013-09-14 ENCOUNTER — Non-Acute Institutional Stay (SKILLED_NURSING_FACILITY): Payer: Medicaid Other | Admitting: Adult Health

## 2013-09-14 DIAGNOSIS — K219 Gastro-esophageal reflux disease without esophagitis: Secondary | ICD-10-CM

## 2013-09-14 DIAGNOSIS — E0590 Thyrotoxicosis, unspecified without thyrotoxic crisis or storm: Secondary | ICD-10-CM

## 2013-09-14 DIAGNOSIS — F32A Depression, unspecified: Secondary | ICD-10-CM

## 2013-09-14 DIAGNOSIS — I10 Essential (primary) hypertension: Secondary | ICD-10-CM

## 2013-09-14 DIAGNOSIS — I5022 Chronic systolic (congestive) heart failure: Secondary | ICD-10-CM

## 2013-09-14 DIAGNOSIS — F329 Major depressive disorder, single episode, unspecified: Secondary | ICD-10-CM

## 2013-09-14 DIAGNOSIS — G8929 Other chronic pain: Secondary | ICD-10-CM

## 2013-09-14 DIAGNOSIS — I4891 Unspecified atrial fibrillation: Secondary | ICD-10-CM

## 2013-09-14 DIAGNOSIS — E1149 Type 2 diabetes mellitus with other diabetic neurological complication: Secondary | ICD-10-CM

## 2013-09-14 DIAGNOSIS — N400 Enlarged prostate without lower urinary tract symptoms: Secondary | ICD-10-CM

## 2013-10-10 ENCOUNTER — Encounter: Payer: Self-pay | Admitting: Adult Health

## 2013-10-10 NOTE — Progress Notes (Signed)
Patient ID: [name], male   DOB: 08/04/1951, 62 y.o.   MRN: [id number]  [place]  No Known Allergies  Chief Complaint  Patient presents with  . Medical Managment of Chronic Issues    HPI  He is being seen for the management of his chronic illnesses. There are no concerns being voiced by the nursing staff or the patient at this time. Overall his status remains unchanged.  The coreg was stopped on 07-19-13 due to bradycardia   Past Medical History  Diagnosis Date  . 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 He had markedly elevated LV filling pressures and pulmonary venous hypertension with normal pulmonary vascular resistance suggesting he will normalize his pulmonary pressures with adequate diuresis.   . Renal insufficiency   . Sleep apnea   . Morbid obesity   . A-fib   . Chronic diastolic heart failure     Echo 8.25.2011:  Mod LVH; EF 50%; Mod LAE; RV dilation and ? dysfxn; mild RAE;   . Other primary cardiomyopathies   . Dyslipidemia   . Essential hypertension, benign   . Diabetes mellitus without mention of complication   . Atrial fibrillation 08/26/2009    Annotation: Permanent Not a coumadin candidate secondary to noncompliance Qualifier: Diagnosis of  By: Huntley Dec, Scott    . Chronic systolic heart failure 08/26/2009      Qualifier: Diagnosis of  By: Huntley Dec, Scott     . CORONARY ARTERY DISEASE, S/P PTCA 10/08/2009    Qualifier: Diagnosis of  By: Trevor Iha, RN, Heather    . Acute and chronic respiratory failure (acute-on-chronic) 02/07/2013  . HYPERTHYROIDISM 02/23/2011    Qualifier: Diagnosis of  By: Daphine Deutscher FNP, Zena Amos    . BPH (benign prostatic hyperplasia) 07/23/2013  . Depression 03/13/2013  . Chronic pain 03/13/2013  . Hyperkalemia 02/07/2013  . SLEEP APNEA 08/26/2009    Annotation: diagnosed by overnight oximetry in hospital in Aug. 2010 does not want to  wear CPAP Qualifier: Diagnosis of  By: Brynda Rim       Past Surgical History  Procedure Laterality Date  . None      Filed Vitals:   07/23/13 1415  BP: 128/68  Pulse: 70  Height: 5\' 11"  (1.803 m)  Weight: 324 lb (146.965 kg)    MEDICATIONS  Tapazole 5 mg daily depakote 250 mg nightly Ditropan 5 mg twice daily Metformin 1 gm twice daily lantus 25 units daily mag ox 400 mg twice daily Clonidine 0.2 mg twice daily neurontin 300 mg three times daily miralax 17 gm daily Albuterol neb treatment every 2 hours as needed duoneb every 6 hours as needed lidoderm patch daily flomax 0.4 mg daily proscar 5 mg daily aldactone 25 mg daily lisinopril 40 mg daily Vit d 1000 units daily Vit b 1000 mcg daily Digoxin 0.375 mcg daily cymbalta 40 mg daily Iron 325 mg daily Zantac 150 mg daily Lasix 80 mg twice daily  K+ 40 meq twice daily pradaxa 150 mg twice daily Saline nasal spray twice daily   LABS REVIEWED;   05-31-13: glucose 87; bun 20; creat 0.98; k+4.2; na++137; chol 143; ldl 100; trig 89; tsh 0.023;  free T4 1.35; vit d 29; hgb a1c 6.0 06-01-13: glucose 95; bun 16; creat 0.89; k+4.3; na++137; liver normal albumin 3.1; tsh 0.014;  Free T4 1.32; chol 137; ldl 95; trig 82; hgb a1c 6.1; dig 1.4; vit d 30;  06-25-13; tsh 0.010; free T4 1.42; free T3 3.3 07-20-13: glucose 135; bun 17; creat 0.86; k+4.1; na++ 137   Review of Systems  Constitutional: Negative for malaise/fatigue.  Respiratory: Negative for cough and shortness of breath.   Cardiovascular: Negative for chest pain and leg swelling.  Gastrointestinal: Negative for heartburn and abdominal pain.  Musculoskeletal: Negative for joint pain and myalgias.  Skin: Negative.   Neurological: Negative for headaches.  Psychiatric/Behavioral: Negative for depression. The patient does not have insomnia.     Physical Exam  Constitutional:  Morbidly obese  Neck: Neck supple. No JVD present.  Cardiovascular: Normal  rate, regular rhythm and intact distal pulses.   Respiratory: Effort normal and breath sounds normal. No respiratory distress. He has no wheezes.  GI: Soft. Bowel sounds are normal. He exhibits no distension. There is no tenderness.  Musculoskeletal: He exhibits no edema.  Limited range of motion due to obesity   Neurological: He is alert.  Skin: Skin is warm and dry.  Psychiatric: He has a normal mood and affect.      Assessment/plan  1. Afib: heart regular; will continue digoxin 0.375 mg daily will continue pradaxa 150 mg twice daily; will monitor  2. Chf: he is presently stable will continue aldactone 25 mg daily; lasix 80 mg twice daily with k+ 40 meq twice daily and will continue to monitor  3. Diabetes: his status is stable will continue his lantus 25 units daily and metformin 1 gm twice daily and will monitor his disease state   4. Bph: is without change in status; will continue flomax and proscar daily and will monitor   5. Hypertension: is stable will continue lisinopril 40 mg daily and clonidine 0.2 mg twice daily   6. Hyperthyroidism: will continue tapazole 5 mg daily last free t4 1.42 and free t3 3.3   7. UI will continue ditropan 5 mg twice daily   8. Anemia: will continue iron daily  9. Depression: is followed by psych services; will continue cymbalta 40 mg daily and depakote 250 mg nightly to help stabilize mood  10. Chronic pain his pain is managed will continue the lidoderm patch to his shoulder and neurontin 300 mg three times daily  11. Gerd: will continue zantac 150 mg daily  Time spent with patient 50 minutes.

## 2013-10-12 ENCOUNTER — Non-Acute Institutional Stay (SKILLED_NURSING_FACILITY): Payer: Medicaid Other | Admitting: Internal Medicine

## 2013-10-12 DIAGNOSIS — I4891 Unspecified atrial fibrillation: Secondary | ICD-10-CM

## 2013-10-12 DIAGNOSIS — E119 Type 2 diabetes mellitus without complications: Secondary | ICD-10-CM

## 2013-10-12 DIAGNOSIS — R32 Unspecified urinary incontinence: Secondary | ICD-10-CM

## 2013-10-12 DIAGNOSIS — F329 Major depressive disorder, single episode, unspecified: Secondary | ICD-10-CM

## 2013-10-12 DIAGNOSIS — I5022 Chronic systolic (congestive) heart failure: Secondary | ICD-10-CM

## 2013-10-12 DIAGNOSIS — I10 Essential (primary) hypertension: Secondary | ICD-10-CM

## 2013-10-12 DIAGNOSIS — K219 Gastro-esophageal reflux disease without esophagitis: Secondary | ICD-10-CM

## 2013-10-12 DIAGNOSIS — D649 Anemia, unspecified: Secondary | ICD-10-CM

## 2013-10-12 DIAGNOSIS — N400 Enlarged prostate without lower urinary tract symptoms: Secondary | ICD-10-CM

## 2013-10-12 NOTE — Progress Notes (Signed)
Patient ID: [name], male   DOB: 17-May-1951, 62 y.o.   MRN: [id number]                 s    Chief Complaint   Patient presents with   .  Medical Managment of Chronic Issues        HPI   He is being seen for the management of his chronic illnesses. There are no concerns being voiced by the nursing staff or the patient at this time. Overall his status remains unchanged.  The coreg was stopped on 07-19-13 due to bradycardia     Past Medical History   Diagnosis  Date   .  Coronary artery disease         Cardiac catheterization August 06, 2009, showed nonobstructive coronary artery disease with distal diabetic vasculopathy. He had markedly elevated LV filling pressures and pulmonary venous hypertension with normal pulmonary vascular resistance suggesting he will normalize his pulmonary pressures with adequate diuresis.    .  Renal insufficiency     .  Sleep apnea     .  Morbid obesity     .  A-fib     .  Chronic diastolic heart failure         Echo 8.25.2011:  Mod LVH; EF 50%; Mod LAE; RV dilation and ? dysfxn; mild RAE;    .  Other primary cardiomyopathies     .  Dyslipidemia     .  Essential hypertension, benign     .  Diabetes mellitus without mention of complication     .  Atrial fibrillation  08/26/2009       Annotation: Permanent Not a coumadin candidate secondary to noncompliance Qualifier: Diagnosis of  By: Huntley Dec, Scott     .  Chronic systolic heart failure  08/26/2009         Qualifier: Diagnosis of  By: Huntley Dec, Scott      .  CORONARY ARTERY DISEASE, S/P PTCA  10/08/2009       Qualifier: Diagnosis of  By: Trevor Iha, RN, Heather     .  Acute and chronic respiratory failure (acute-on-chronic)  02/07/2013   .  HYPERTHYROIDISM  02/23/2011       Qualifier: Diagnosis of  By: Daphine Deutscher FNP, Zena Amos     .  BPH (benign prostatic hyperplasia)  07/23/2013   .  Depression  03/13/2013   .  Chronic pain  03/13/2013   .  Hyperkalemia  02/07/2013   .  SLEEP APNEA  08/26/2009    Annotation: diagnosed by overnight oximetry in hospital in Aug. 2010 does not want to wear CPAP Qualifier: Diagnosis of  By: Brynda Rim             Past Surgical History   Procedure  Laterality  Date   .  None                 MEDICATIONS   Tapazole 2.5 mg daily depakote 250 mg nightly Ditropan 5 mg twice daily Metformin 1 gm twice daily lantus 25 units daily mag ox 400 mg twice daily Clonidine 0.2 mg twice daily neurontin 300 mg three times daily miralax 17 gm daily Albuterol neb treatment every 2 hours as needed duoneb every 6 hours as needed lidoderm patch daily flomax 0.4 mg daily proscar 5 mg daily aldactone 25 mg daily lisinopril 40 mg daily Vit d 1000 units daily Vit b 1000 mcg daily Digoxin 0.375 mcg daily cymbalta  40 mg daily Iron 325 mg daily Zantac 150 mg daily Lasix 80 mg twice daily   K+ 40 meq twice daily pradaxa 150 mg twice daily Saline nasal spray twice daily     LABS REVIEWED;  09/14/2013.  TSH-0.008.  Free T4-2.44  08/13/2013.  TSH-0.009-free T4-2 0.43.  Hemoglobin A1c 7.1.  07/20/2013.  Sodium 134 potassium 4.1 BUN 17 creatinine 0.86.  Mag-- 1.7.  Digoxin 0.9.      05-31-13: glucose 87; bun 20; creat 0.98; k+4.2; na++137; chol 143; ldl 100; trig 89; tsh 0.023;   free T4 1.35; vit d 29; hgb a1c 6.0 06-01-13: glucose 95; bun 16; creat 0.89; k+4.3; na++137; liver normal albumin 3.1; tsh 0.014;   Free T4 1.32; chol 137; ldl 95; trig 82; hgb a1c 6.1; dig 1.4; vit d 30;   06-25-13; tsh 0.010; free T4 1.42; free T3 3.3 07-20-13: glucose 135; bun 17; creat 0.86; k+4.1; na++ 137    Review of Systems  Constitutional: Negative for malaise/fatigue.  Respiratory: Negative for cough and shortness of breath.   Cardiovascular: Negative for chest pain and leg swelling.  Gastrointestinal: Negative for heartburn and abdominal pain.  Musculoskeletal: Negative for joint pain and myalgias.  Skin: Negative.   Neurological:  Negative for headaches.  Psychiatric/Behavioral: Negative for depression. The patient does not have insomnia.       Physical Exam Temperature is 97.0 pulse 76 respirations 18 blood pressure 130/74 weight is 309 --there is some variability but appears to be slowly trending down  Constitutional:  Morbidly obese  Neck: Neck supple. No JVD present.  Cardiovascular: irregular rate, regular rhythm and intact distal pulses.   Respiratory: Effort normal and breath sounds normal. No respiratory distress. He has no wheezes.  GI: Soft. Bowel sounds are normal. He exhibits no distension. There is no tenderness abdomen is obese.  Musculoskeletal: He exhibits no edema--moves all extremities -- appears at baseline.  Limited range of motion due to obesity   Neurological: He is alert.  Skin: Skin is warm and dry.  Psychiatric: He has a normal mood and affect.          Assessment/plan   1. Afib: heart regular; will continue digoxin 0.375 mg daily will continue pradaxa 150 mg twice daily; will monitor   2. Chf: he is presently stable will continue aldactone 25 mg daily; lasix 80 mg twice daily with k+ 40 meq twice daily and will continue to monitor--update metabolic panel Day   3. Diabetes: his status is stable will continue his lantus 25 units daily and metformin 1 gm twice daily and will monitor his disease state --a.m. CBGs run mid 100s atHS variable  110-occasionally above 200 but this is not frequent.     4. Bph: is without change in status; will continue flomax and proscar daily and will monitor    5. Hypertension: is stable will continue lisinopril 40 mg daily and clonidine 0.2 mg twice daily --systolics appear to however mainly in the 120s-1 40s-diastolics largely in the 70s and 80s   6. Hyperthyroidism: will continue tapazole 2.5 mg daily--update labs are pending   7. UI will continue ditropan 5 mg twice daily    8. Anemia: will continue iron daily--update CBC   9.  Depression: is followed by psych services; will continue cymbalta 40 mg daily and depakote 250 mg nightly to help stabilize mood--update Depakote level Date   10. Chronic pain his pain is managed will continue the lidoderm patch to his shoulder and neurontin  300 mg three times daily   11. Genella Rife: will continue zantac 150 mg daily  [phone]

## 2013-10-15 ENCOUNTER — Encounter: Payer: Self-pay | Admitting: Adult Health

## 2013-10-15 NOTE — Progress Notes (Signed)
Patient ID: [name], male   DOB: [birth year], 62 y.o.   MRN: [id number]  [place]  No Known Allergies  Chief Complaint  Patient presents with  . Medical Managment of Chronic Issues    HPI  He is being seen for the management of his chronic illnesses. There are no concerns being voiced by the nursing staff at this time. He states he is doing and is having no concerns as well. His free T4 is slightly elevated and will require his tapazole to be adjusted.   Past Medical History  Diagnosis Date  . 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 He had markedly elevated LV filling pressures and pulmonary venous hypertension with normal pulmonary vascular resistance suggesting he will normalize his pulmonary pressures with adequate diuresis.   . Renal insufficiency   . Sleep apnea   . Morbid obesity   . A-fib   . Chronic diastolic heart failure     Echo 8.25.2011:  Mod LVH; EF 50%; Mod LAE; RV dilation and ? dysfxn; mild RAE;   . Other primary cardiomyopathies   . Dyslipidemia   . Essential hypertension, benign   . Diabetes mellitus without mention of complication   . Atrial fibrillation 08/26/2009    Annotation: Permanent Not a coumadin candidate secondary to noncompliance Qualifier: Diagnosis of  By: Huntley Dec, Scott    . Chronic systolic heart failure 08/26/2009      Qualifier: Diagnosis of  By: Huntley Dec, Scott     . CORONARY ARTERY DISEASE, S/P PTCA 10/08/2009    Qualifier: Diagnosis of  By: Trevor Iha, RN, Heather    . Acute and chronic respiratory failure (acute-on-chronic) 02/07/2013  . HYPERTHYROIDISM 02/23/2011    Qualifier: Diagnosis of  By: Daphine Deutscher FNP, Zena Amos    . BPH (benign prostatic hyperplasia) 07/23/2013  . Depression 03/13/2013  . Chronic pain 03/13/2013  . Hyperkalemia 02/07/2013  . SLEEP APNEA 08/26/2009    Annotation: diagnosed by overnight oximetry in hospital in  Aug. 2010 does not want to wear CPAP Qualifier: Diagnosis of  By: Brynda Rim      Past Surgical History  Procedure Laterality Date  . None      Filed Vitals:   09/14/13 1543  BP: 138/85  Pulse: 81  Height: 5\' 11"  (1.803 m)  Weight: 315 lb (142.883 kg)    Current Outpatient Prescriptions on File Prior to Visit  Medication Sig Dispense Refill  . albuterol (PROVENTIL) (2.5 MG/3ML) 0.083% nebulizer solution Take 2.5 mg by nebulization every 2 (two) hours as needed for wheezing or shortness of breath.      . Cholecalciferol 1000 UNITS tablet Take 1,000 Units by mouth daily.      . cloNIDine (CATAPRES) 0.1 MG tablet Take 0.2 mg by mouth 2 (two) times daily.       . cyanocobalamin 1000 MCG tablet Take 1,000 mcg by mouth daily.       . dabigatran (PRADAXA) 150 MG CAPS Take 1 capsule (150 mg total) by mouth every 12 (twelve) hours.  60 capsule  3  . digoxin (LANOXIN) 0.125 MG tablet Take 0.375 mg by mouth daily.      . DULoxetine (CYMBALTA) 20 MG capsule Take 40 mg by mouth daily.      . ferrous sulfate 325 (65 FE) MG EC tablet Take 325 mg by mouth daily.       . furosemide (LASIX) 40 MG tablet Take  2 tablets (80 mg total) by mouth 2 (two) times daily.  30 tablet    . gabapentin (NEURONTIN) 300 MG capsule Take 300 mg by mouth 3 (three) times daily.      . insulin glargine (LANTUS) 100 UNIT/ML injection Inject 25 Units into the skin at bedtime.       Marland Kitchen ipratropium-albuterol (DUONEB) 0.5-2.5 (3) MG/3ML SOLN Take 3 mLs by nebulization every 6 (six) hours as needed. For shortness of breath or wheezing      . lidocaine (LIDODERM) 5 % Place 1 patch onto the skin daily. 1/2 patch to both shins daily      . lisinopril (PRINIVIL,ZESTRIL) 40 MG tablet Take 1 tablet (40 mg total) by mouth daily.      . magnesium oxide (MAG-OX) 400 MG tablet Take 400 mg by mouth 2 (two) times daily.      . metFORMIN (GLUCOPHAGE) 500 MG tablet Take 1,000 mg by mouth 2 (two) times daily with a meal.      .  methimazole (TAPAZOLE) 10 MG tablet Take 5 mg by mouth daily.       . polyethylene glycol (MIRALAX / GLYCOLAX) packet Take 17 g by mouth at bedtime.       . potassium chloride SA (K-DUR,KLOR-CON) 20 MEQ tablet Take 40 mEq by mouth 2 (two) times daily.       . sodium chloride (OCEAN) 0.65 % nasal spray Place 1 spray into the nose 2 (two) times daily.      Marland Kitchen spironolactone (ALDACTONE) 25 MG tablet Take 1 tablet (25 mg total) by mouth daily.      . promethazine (PHENERGAN) 25 MG tablet Take 25 mg by mouth every 6 (six) hours as needed for nausea.       . Thiamine HCl (THIAMINE PO) Take 100 mg by mouth daily.       . traMADol (ULTRAM) 50 MG tablet Take 50 mg by mouth every 8 (eight) hours as needed for pain.       No current facility-administered medications on file prior to visit.     LABS REVIEWED;   05-31-13: glucose 87; bun 20; creat 0.98; k+4.2; na++137; chol 143; ldl 100; trig 89; tsh 0.023;   free T4 1.35; vit d 29; hgb a1c 6.0 06-01-13: glucose 95; bun 16; creat 0.89; k+4.3; na++137; liver normal albumin 3.1; tsh 0.014;   Free T4 1.32; chol 137; ldl 95; trig 82; hgb a1c 6.1; dig 1.4; vit d 30;   06-25-13; tsh 0.010; free T4 1.42; free T3 3.3 07-20-13: glucose 135; bun 17; creat 0.86; k+4.1; na++ 137  08-13-13: tsh 0.009; free T4 2.43; hgb a1c 7.1 09-14-13: tsh 0.008; free T4: 2.44   Review of Systems  Constitutional: Negative for malaise/fatigue.  Respiratory: Negative for cough and shortness of breath.   Cardiovascular: Negative for chest pain and leg swelling.  Gastrointestinal: Negative for heartburn and abdominal pain.  Musculoskeletal: Negative for joint pain and myalgias.  Skin: Negative.   Neurological: Negative for headaches.  Psychiatric/Behavioral: Negative for depression. The patient does not have insomnia.     Physical Exam  Constitutional:  Morbidly obese  Neck: Neck supple. No JVD present.  Cardiovascular: Normal rate, regular rhythm and intact distal pulses.    Respiratory: Effort normal and breath sounds normal. No respiratory distress. He has no wheezes.  GI: Soft. Bowel sounds are normal. He exhibits no distension. There is no tenderness.  Musculoskeletal: He exhibits no edema.  Limited range of motion due to  obesity   Neurological: He is alert.  Skin: Skin is warm and dry.  Psychiatric: He has a normal mood and affect.      Assessment/plan  1. Afib: heart regular; will continue digoxin 0.375 mg daily will continue pradaxa 150 mg twice daily; will monitor  2. Chf: he is presently stable will continue aldactone 25 mg daily; lasix 80 mg twice daily with k+ 40 meq twice daily and will continue to monitor  3. Diabetes: his status is stable will continue his lantus 25 units daily and metformin 1 gm twice daily and will monitor his disease state his hgb a1c is 7.1   4. Bph: is without change in status; will continue flomax and proscar daily and will monitor   5. Hypertension: is stable will continue lisinopril 40 mg daily and clonidine 0.2 mg twice daily   6. Hyperthyroidism: will lower tapazole to 2.5 mg daily and will check tsh free t3 and free t4 in 4 weeks. Will monitor   7. Anemia: will continue iron daily  8. Depression: is followed by psych services; will continue cymbalta 40 mg daily and depakote 250 mg nightly to help stabilize mood  9. Chronic pain his pain is managed will continue the 1/2 lidoderm patch to shins daily  and neurontin 300 mg three times daily  10. Gerd: will continue zantac 150 mg daily

## 2013-10-23 NOTE — Progress Notes (Signed)
Patient ID: [name], male   DOB: [birth year], 62 y.o.   MRN: [id number]  [place]  No Known Allergies  Chief Complaint  Patient presents with  . Medical Managment of Chronic Issues   HPI He is being seen for the management of his chronic illnesses. Overall his status has remained unchanged. He is not voicing any concerns and neither is the nursing staff. His tsh remains slightly low and will require his tapazole to be adjusted.    Patient's Medications  New Prescriptions   No medications on file  Previous Medications   ALBUTEROL (PROVENTIL) (2.5 MG/3ML) 0.083% NEBULIZER SOLUTION    Take 2.5 mg by nebulization every 2 (two) hours as needed for wheezing or shortness of breath.   CARVEDILOL (COREG) 25 MG TABLET    Take 25 mg by mouth 2 (two) times daily with a meal.    CHOLECALCIFEROL 1000 UNITS TABLET    Take 1,000 Units by mouth daily.   CLONIDINE (CATAPRES) 0.1 MG TABLET    Take 0.2 mg by mouth 2 (two) times daily.   CYANOCOBALAMIN 1000 MCG TABLET    Take 1,000 mcg by mouth daily.    DABIGATRAN (PRADAXA) 150 MG CAPS    Take 1 capsule (150 mg total) by mouth every 12 (twelve) hours.   DIGOXIN (LANOXIN) 0.125 MG TABLET    Take 0.375 mg by mouth daily.   DULOXETINE (CYMBALTA) 20 MG CAPSULE    Take 40 mg by mouth daily.   FERROUS SULFATE 325 (65 FE) MG EC TABLET    Take 325 mg by mouth daily.    FUROSEMIDE (LASIX) 40 MG TABLET    Take 2 tablets (80 mg total) by mouth 2 (two) times daily.   GABAPENTIN (NEURONTIN) 300 MG CAPSULE    Take 300 mg by mouth 3 (three) times daily.   INSULIN GLARGINE (LANTUS) 100 UNIT/ML INJECTION    Inject 25 Units into the skin at bedtime.    IPRATROPIUM-ALBUTEROL (DUONEB) 0.5-2.5 (3) MG/3ML SOLN    Take 3 mLs by nebulization every 6 (six) hours as needed. For shortness of breath or wheezing   LIDOCAINE (LIDODERM) 5 %    Place 1 patch onto the skin daily. Remove & Discard patch within 12 hours or as directed by MD   LISINOPRIL (PRINIVIL,ZESTRIL) 40 MG  TABLET    Take 1 tablet (40 mg total) by mouth daily.   MAGNESIUM OXIDE (MAG-OX) 400 MG TABLET    Take 400 mg by mouth 2 (two) times daily.   METFORMIN (GLUCOPHAGE) 500 MG TABLET    Take 1,000 mg by mouth 2 (two) times daily with a meal.   METHIMAZOLE (TAPAZOLE) 10 MG TABLET    Take 10 mg by mouth daily.    OMEPRAZOLE (PRILOSEC) 20 MG CAPSULE    Take 20 mg by mouth daily.   POLYETHYLENE GLYCOL (MIRALAX / GLYCOLAX) PACKET    Take 17 g by mouth at bedtime.    POTASSIUM CHLORIDE SA (K-DUR,KLOR-CON) 20 MEQ TABLET    Take 20 mEq by mouth 2 (two) times daily.   PROMETHAZINE (PHENERGAN) 25 MG TABLET    Take 25 mg by mouth every 6 (six) hours as needed for nausea.    SODIUM CHLORIDE (OCEAN) 0.65 % NASAL SPRAY    Place 1 spray into the nose 2 (two) times daily.   SPIRONOLACTONE (ALDACTONE) 25 MG TABLET    Take 1 tablet (25 mg total) by mouth daily.   THIAMINE HCL (THIAMINE PO)    Take  100 mg by mouth daily.    TRAMADOL (ULTRAM) 50 MG TABLET    Take 50 mg by mouth every 8 (eight) hours as needed for pain.  Modified Medications   No medications on file  Discontinued Medications   No medications on file    SIGNIFICANT DIAGNOSTIC EXAMS    LABS REVIEWED:  01-31-13: wbc 4.0; hgb 13.2; hct 40.1; mcv 84.2 plt 228; glucose 72; bun 17; creat 0.96; k+3.9; na++140 Liver normal albumin 2.8; chol 148; ldl 109; trig 66; hgb a1c 5.6; dig 1.6; vit d 23 02-21-13: glucose 98; bun 41; creat 1.63; k+3.3;na++ 140; mag 1.7; free t3 3.0  03-09-13: wbc 4.6; hgb 12.5; hct 37.8;mcv 83.4 ;plt 183; glucose 91; bun 15; creat 0.69; k+3.6; na++138 Liver normal albumin 2.9; ca++ 7.9; mag 1.9 03-23-13: urine culture: p. Mirabilis: septra  04-21-13: urine culture: no growth 04-23-13: mag 1.7  06-01-13: glucose 95; bun 16; creat 0.89; k+4.3; na+ 137; liver normal albumin 3.1; chol 137; ldl 95;  trig 82; hgb a1c 6.1; dig 1.4; vit d 30; tsh 0.014; free T4: 1.32    Review of Systems  Constitutional: Negative for malaise/fatigue.   Respiratory: Negative for cough and shortness of breath.   Cardiovascular: Negative for chest pain and palpitations.  Gastrointestinal: Negative for heartburn, abdominal pain and constipation.  Musculoskeletal: Negative for myalgias and back pain.  Skin: Negative.   Neurological: Negative for headaches.  Psychiatric/Behavioral: Negative for depression. The patient is not nervous/anxious.     Physical Exam  Constitutional: He appears well-developed and well-nourished.  Morbidly obese  Neck: Neck supple. No JVD present. No thyromegaly present.  Cardiovascular: Normal rate and regular rhythm.   Respiratory: Effort normal and breath sounds normal. No respiratory distress.  GI: Soft. Bowel sounds are normal. He exhibits no distension. There is no tenderness.  Musculoskeletal: He exhibits no edema.  Limites range of motion due to obesity  Neurological: He is alert.  Skin: Skin is dry.  Psychiatric: He has a normal mood and affect.      ASSESSMENT/ PLAN:  Essential hypertension, benign His blood pressure is stable will continue  clonidine  0.2 mg twice daily will continue lisinopril 40 mg daily; coreg 25 mg twice daily and will monitor   CORONARY ARTERY DISEASE, S/P PTCA He is stable will no make changes will monitor his status   Chronic systolic heart failure Is stable will continue lasix 80 mg twice daily with k+ 40 meq twice daily; coreg 25 mg twice daily and aldactone 25 mg daily  and will monitor his status   Atrial fibrillation He is stable will continue digoxin 0.375 mg daily for rate control and will continue pradaxa 150 mg twice daily for anticoagulation and will monitor his status   Chronic respiratory failure His respiratory status is stable will continue albuterol neb treatments every 2 hours as needed and duoneb treatments every 6 hours as needed and will monitor   GERD (gastroesophageal reflux disease) Will continue prilosec 20 mg daily   HYPERTHYROIDISM Will  change his tapazole to 10 mg in the am and 2.5 mg in the pm and will check tsh free t3 and free t4 in one month. Will monitor his status   DIABETES MELLITUS, TYPE II His status is stable will continue lantus 25 units nightly and metformin 1 gm twice daily   Chronic pain  His pain is managed is using a lidoderm patch to left lower leg  and is taking neurontin 300 mg three times daily  will monitor    Depression Is presently stable will continue cymbalta 40 mg daily and will monitor his emotional well being.   Anemia Will continue iron daily

## 2013-10-26 ENCOUNTER — Non-Acute Institutional Stay (SKILLED_NURSING_FACILITY): Payer: Medicaid Other | Admitting: Adult Health

## 2013-10-26 DIAGNOSIS — E0590 Thyrotoxicosis, unspecified without thyrotoxic crisis or storm: Secondary | ICD-10-CM

## 2013-10-29 ENCOUNTER — Encounter: Payer: Self-pay | Admitting: Adult Health

## 2013-10-29 MED ORDER — METHIMAZOLE 10 MG PO TABS: 5.0000 mg | ORAL_TABLET | Freq: Every day | ORAL | Status: DC

## 2013-10-29 NOTE — Progress Notes (Signed)
Patient ID: [name], male   DOB: Nov 07, 1951, 62 y.o.   MRN: [id number]  [place]  No Known Allergies   Chief Complaint  Patient presents with  . Acute Visit    follow up thyroid labs    HPI: He is being seen for his thyroid status. His tsh remains low his free thyroid labs are more elevated; he will require adjustment of his tapazole in order to better treat his disease state. He is without any complains or concerns today. There are no concerns being voiced by the nursing staff.   Past Medical History  Diagnosis Date  . 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 He had markedly elevated LV filling pressures and pulmonary venous hypertension with normal pulmonary vascular resistance suggesting he will normalize his pulmonary pressures with adequate diuresis.   . Renal insufficiency   . Sleep apnea   . Morbid obesity   . A-fib   . Chronic diastolic heart failure     Echo 8.25.2011:  Mod LVH; EF 50%; Mod LAE; RV dilation and ? dysfxn; mild RAE;   . Other primary cardiomyopathies   . Dyslipidemia   . Essential hypertension, benign   . Diabetes mellitus without mention of complication   . Atrial fibrillation 08/26/2009    Annotation: Permanent Not a coumadin candidate secondary to noncompliance Qualifier: Diagnosis of  By: Huntley Dec, Scott    . Chronic systolic heart failure 08/26/2009      Qualifier: Diagnosis of  By: Huntley Dec, Scott     . CORONARY ARTERY DISEASE, S/P PTCA 10/08/2009    Qualifier: Diagnosis of  By: Trevor Iha, RN, Heather    . Acute and chronic respiratory failure (acute-on-chronic) 02/07/2013  . HYPERTHYROIDISM 02/23/2011    Qualifier: Diagnosis of  By: Daphine Deutscher FNP, Zena Amos    . BPH (benign prostatic hyperplasia) 07/23/2013  . Depression 03/13/2013  . Chronic pain 03/13/2013  . Hyperkalemia 02/07/2013  . SLEEP APNEA 08/26/2009    Annotation: diagnosed by  overnight oximetry in hospital in Aug. 2010 does not want to wear CPAP Qualifier: Diagnosis of  By: Brynda Rim      Past Surgical History  Procedure Laterality Date  . None      VITAL SIGNS BP 130/77  Pulse 82  Ht 5\' 11"  (1.803 m)  Wt 305 lb (138.347 kg)  BMI 42.56 kg/m2   Patient's Medications  New Prescriptions   No medications on file  Previous Medications   CHOLECALCIFEROL 1000 UNITS TABLET    Take 1,000 Units by mouth daily.   CLONIDINE (CATAPRES) 0.1 MG TABLET    Take 0.2 mg by mouth 2 (two) times daily.    CYANOCOBALAMIN 1000 MCG TABLET    Take 1,000 mcg by mouth daily.    DABIGATRAN (PRADAXA) 150 MG CAPS    Take 1 capsule (150 mg total) by mouth every 12 (twelve) hours.   DIGOXIN (LANOXIN) 0.125 MG TABLET    Take 0.375 mg by mouth daily.   DIVALPROEX (DEPAKOTE) 250 MG DR TABLET    Take 250 mg by mouth at bedtime.   DULOXETINE (CYMBALTA) 20 MG CAPSULE    Take 40 mg by mouth daily.   FERROUS SULFATE 325 (65 FE) MG EC TABLET    Take 325 mg by mouth daily.    FINASTERIDE (PROSCAR) 5 MG TABLET    Take 5 mg by mouth daily.   FUROSEMIDE (LASIX) 40 MG TABLET  Take 2 tablets (80 mg total) by mouth 2 (two) times daily.   GABAPENTIN (NEURONTIN) 300 MG CAPSULE    Take 300 mg by mouth 3 (three) times daily.   INSULIN GLARGINE (LANTUS) 100 UNIT/ML INJECTION    Inject 25 Units into the skin at bedtime.    LIDOCAINE (LIDODERM) 5 %    Place 1 patch onto the skin daily. 1/2 patch to both shins daily   LISINOPRIL (PRINIVIL,ZESTRIL) 40 MG TABLET    Take 1 tablet (40 mg total) by mouth daily.   MAGNESIUM OXIDE (MAG-OX) 400 MG TABLET    Take 400 mg by mouth 2 (two) times daily.   METFORMIN (GLUCOPHAGE) 500 MG TABLET    Take 1,000 mg by mouth 2 (two) times daily with a meal.   METHIMAZOLE (TAPAZOLE) 10 MG TABLET    Take 2.5 mg by mouth daily.    POLYETHYLENE GLYCOL (MIRALAX / GLYCOLAX) PACKET    Take 17 g by mouth at bedtime.    POTASSIUM CHLORIDE SA (K-DUR,KLOR-CON) 20 MEQ TABLET     Take 40 mEq by mouth 2 (two) times daily.    RANITIDINE (ZANTAC) 150 MG TABLET    Take 150 mg by mouth at bedtime.   SODIUM CHLORIDE (OCEAN) 0.65 % NASAL SPRAY    Place 1 spray into the nose 2 (two) times daily.   SPIRONOLACTONE (ALDACTONE) 25 MG TABLET    Take 1 tablet (25 mg total) by mouth daily.   TAMSULOSIN (FLOMAX) 0.4 MG CAPS CAPSULE    Take 0.4 mg by mouth daily.   THIAMINE HCL (THIAMINE PO)    Take 100 mg by mouth daily.   Modified Medications   No medications on file  Discontinued Medications   ALBUTEROL (PROVENTIL) (2.5 MG/3ML) 0.083% NEBULIZER SOLUTION    Take 2.5 mg by nebulization every 2 (two) hours as needed for wheezing or shortness of breath.   IPRATROPIUM-ALBUTEROL (DUONEB) 0.5-2.5 (3) MG/3ML SOLN    Take 3 mLs by nebulization every 6 (six) hours as needed. For shortness of breath or wheezing   PROMETHAZINE (PHENERGAN) 25 MG TABLET    Take 25 mg by mouth every 6 (six) hours as needed for nausea.    TRAMADOL (ULTRAM) 50 MG TABLET    Take 50 mg by mouth every 8 (eight) hours as needed for pain.    SIGNIFICANT DIAGNOSTIC EXAMS    LABS REVIEWED;   05-31-13: glucose 87; bun 20; creat 0.98; k+4.2; na++137; chol 143; ldl 100; trig 89; tsh 0.023;   free T4 1.35; vit d 29; hgb a1c 6.0 06-01-13: glucose 95; bun 16; creat 0.89; k+4.3; na++137; liver normal albumin 3.1; tsh 0.014;   Free T4 1.32; chol 137; ldl 95; trig 82; hgb a1c 6.1; dig 1.4; vit d 30;   06-25-13; tsh 0.010; free T4 1.42; free T3 3.3 07-20-13: glucose 135; bun 17; creat 0.86; k+4.1; na++ 137   08-13-13: tsh 0.009; free T4 2.43; hgb a1c 7.1 09-14-13: tsh 0.008; free T4: 2.44  10-12-13: tsh <0.008; free t4: 2.23; free t3: 7.0 10-15-13: wbc 5.2; hgb 11.9; hct 35.3 ;mcv 79.5; plt 163; glucose 95; bun 24; creat 0.90; k+4.7; na++140; liver normal albumin 3.1; depakote 18.3     Review of Systems   Constitutional: Negative for malaise/fatigue.   Respiratory: Negative for cough and shortness of breath.    Cardiovascular:  Negative for chest pain and leg swelling.   Gastrointestinal: Negative for heartburn and abdominal pain.   Musculoskeletal: Negative for joint pain and  myalgias.   Skin: Negative.    Neurological: Negative for headaches.   Psychiatric/Behavioral: Negative for depression. The patient does not have insomnia.     Physical Exam  Constitutional:  Morbidly obese   Neck: Neck supple. No JVD present.   Cardiovascular: Normal rate, regular rhythm and intact distal pulses.    Respiratory: Effort normal and breath sounds normal. No respiratory distress. He has no wheezes.   GI: Soft. Bowel sounds are normal. He exhibits no distension. There is no tenderness.  Musculoskeletal: He exhibits no edema.  Limited range of motion due to obesity   Neurological: He is alert.   Skin: Skin is warm and dry.  Psychiatric: He has a normal mood and affect.   ASSESSMENT/PLAN  1. Hyperthyroidism: will increase the tapazole to 5 mg daily in 6 weeks will check tsh free t3 and free t4 and will monitor his status

## 2013-11-23 ENCOUNTER — Non-Acute Institutional Stay (SKILLED_NURSING_FACILITY): Payer: Medicaid Other | Admitting: Adult Health

## 2013-11-23 DIAGNOSIS — E0590 Thyrotoxicosis, unspecified without thyrotoxic crisis or storm: Secondary | ICD-10-CM

## 2013-11-23 DIAGNOSIS — N400 Enlarged prostate without lower urinary tract symptoms: Secondary | ICD-10-CM

## 2013-11-23 DIAGNOSIS — G8929 Other chronic pain: Secondary | ICD-10-CM

## 2013-11-23 DIAGNOSIS — K219 Gastro-esophageal reflux disease without esophagitis: Secondary | ICD-10-CM

## 2013-11-23 DIAGNOSIS — I509 Heart failure, unspecified: Secondary | ICD-10-CM

## 2013-11-23 DIAGNOSIS — K5900 Constipation, unspecified: Secondary | ICD-10-CM | POA: Insufficient documentation

## 2013-11-23 DIAGNOSIS — I5033 Acute on chronic diastolic (congestive) heart failure: Secondary | ICD-10-CM

## 2013-11-23 DIAGNOSIS — F329 Major depressive disorder, single episode, unspecified: Secondary | ICD-10-CM

## 2013-11-23 DIAGNOSIS — I4891 Unspecified atrial fibrillation: Secondary | ICD-10-CM

## 2013-11-23 DIAGNOSIS — I10 Essential (primary) hypertension: Secondary | ICD-10-CM

## 2013-11-23 DIAGNOSIS — E1149 Type 2 diabetes mellitus with other diabetic neurological complication: Secondary | ICD-10-CM

## 2013-11-26 ENCOUNTER — Encounter: Payer: Self-pay | Admitting: Adult Health

## 2013-11-26 NOTE — Progress Notes (Signed)
Patient ID: [name], male   DOB: 1951-06-06, 62 y.o.   MRN: [id number]     [place]  No Known Allergies   Chief Complaint  Patient presents with  . Medical Managment of Chronic Issues    HPI:  He is being seen for the management of his chronic illnesses. Overall his status has remained stable for the past month. The nursing staff is not voicing any concerns at this time. He is not voicing any concerns. His tsh remains los and will require further adjustment of his tapazole.    Past Medical History  Diagnosis Date  . 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 He had markedly elevated LV filling pressures and pulmonary venous hypertension with normal pulmonary vascular resistance suggesting he will normalize his pulmonary pressures with adequate diuresis.   . Renal insufficiency   . Sleep apnea   . Morbid obesity   . A-fib   . Chronic diastolic heart failure     Echo 8.25.2011:  Mod LVH; EF 50%; Mod LAE; RV dilation and ? dysfxn; mild RAE;   . Other primary cardiomyopathies   . Dyslipidemia   . Essential hypertension, benign   . Diabetes mellitus without mention of complication   . Atrial fibrillation 08/26/2009    Annotation: Permanent Not a coumadin candidate secondary to noncompliance Qualifier: Diagnosis of  By: Huntley Dec, Scott    . Chronic systolic heart failure 08/26/2009      Qualifier: Diagnosis of  By: Huntley Dec, Scott     . CORONARY ARTERY DISEASE, S/P PTCA 10/08/2009    Qualifier: Diagnosis of  By: Trevor Iha, RN, Heather    . Acute and chronic respiratory failure (acute-on-chronic) 02/07/2013  . HYPERTHYROIDISM 02/23/2011    Qualifier: Diagnosis of  By: Daphine Deutscher FNP, Zena Amos    . BPH (benign prostatic hyperplasia) 07/23/2013  . Depression 03/13/2013  . Chronic pain 03/13/2013  . Hyperkalemia 02/07/2013  . SLEEP APNEA 08/26/2009    Annotation: diagnosed by  overnight oximetry in hospital in Aug. 2010 does not want to wear CPAP Qualifier: Diagnosis of  By: Brynda Rim      Past Surgical History  Procedure Laterality Date  . None      VITAL SIGNS BP 136/89  Pulse 76  Ht 5\' 11"  (1.803 m)  Wt 305 lb (138.347 kg)  BMI 42.56 kg/m2   Patient's Medications  New Prescriptions   No medications on file  Previous Medications   CHOLECALCIFEROL 1000 UNITS TABLET    Take 1,000 Units by mouth daily.   CLONIDINE (CATAPRES) 0.1 MG TABLET    Take 0.2 mg by mouth 2 (two) times daily.    CYANOCOBALAMIN 1000 MCG TABLET    Take 1,000 mcg by mouth daily.    DABIGATRAN (PRADAXA) 150 MG CAPS    Take 1 capsule (150 mg total) by mouth every 12 (twelve) hours.   DIGOXIN (LANOXIN) 0.125 MG TABLET    Take 0.375 mg by mouth daily.   DULOXETINE (CYMBALTA) 20 MG CAPSULE    Take 40 mg by mouth daily.   FERROUS SULFATE 325 (65 FE) MG EC TABLET    Take 325 mg by mouth daily.    FINASTERIDE (PROSCAR) 5 MG TABLET    Take 5 mg by mouth daily.   FUROSEMIDE (LASIX) 40 MG TABLET    Take 2 tablets (80 mg total) by mouth 2 (two) times daily.   GABAPENTIN (NEURONTIN)  300 MG CAPSULE    Take 300 mg by mouth 3 (three) times daily.   INSULIN GLARGINE (LANTUS) 100 UNIT/ML INJECTION    Inject 25 Units into the skin at bedtime.    LIDOCAINE (LIDODERM) 5 %    Place 1 patch onto the skin daily. 1/2 patch to both shins daily   LISINOPRIL (PRINIVIL,ZESTRIL) 40 MG TABLET    Take 1 tablet (40 mg total) by mouth daily.   MAGNESIUM OXIDE (MAG-OX) 400 MG TABLET    Take 400 mg by mouth 2 (two) times daily.   METFORMIN (GLUCOPHAGE) 500 MG TABLET    Take 1,000 mg by mouth 2 (two) times daily with a meal.   METHIMAZOLE (TAPAZOLE) 10 MG TABLET    Take 0.5 tablets (5 mg total) by mouth daily.   POLYETHYLENE GLYCOL (MIRALAX / GLYCOLAX) PACKET    Take 17 g by mouth at bedtime.    POTASSIUM CHLORIDE SA (K-DUR,KLOR-CON) 20 MEQ TABLET    Take 40 mEq by mouth 2 (two) times daily.    RANITIDINE  (ZANTAC) 150 MG TABLET    Take 150 mg by mouth at bedtime.   SODIUM CHLORIDE (OCEAN) 0.65 % NASAL SPRAY    Place 1 spray into the nose 2 (two) times daily.   SPIRONOLACTONE (ALDACTONE) 25 MG TABLET    Take 1 tablet (25 mg total) by mouth daily.   TAMSULOSIN (FLOMAX) 0.4 MG CAPS CAPSULE    Take 0.4 mg by mouth daily.   THIAMINE HCL (THIAMINE PO)    Take 100 mg by mouth daily.   Modified Medications   No medications on file  Discontinued Medications   DIVALPROEX (DEPAKOTE) 250 MG DR TABLET    Take 250 mg by mouth at bedtime.    SIGNIFICANT DIAGNOSTIC EXAMS  LABS REVIEWED;  05-31-13: glucose 87; bun 20; creat 0.98; k+4.2; na++137; chol 143; ldl 100; trig 89; tsh 0.023;  free T4 1.35; vit d 29; hgb a1c 6.0  06-01-13: glucose 95; bun 16; creat 0.89; k+4.3; na++137; liver normal albumin 3.1; tsh 0.014;  Free T4 1.32; chol 137; ldl 95; trig 82; hgb a1c 6.1; dig 1.4; vit d 30;  06-25-13; tsh 0.010; free T4 1.42; free T3 3.3  07-20-13: glucose 135; bun 17; creat 0.86; k+4.1; na++ 137  08-13-13: tsh 0.009; free T4 2.43; hgb a1c 7.1  09-14-13: tsh 0.008; free T4: 2.44  10-12-13: tsh <0.008; free t4: 2.23; free t3: 7.0  10-15-13: wbc 5.2; hgb 11.9; hct 35.3 ;mcv 79.5; plt 163; glucose 95; bun 24; creat 0.90; k+4.7; na++140; liver normal albumin 3.1; depakote 18.3  11-07-13: tsh <0.008; total t4: 12.6       Review of Systems  Constitutional: Negative for malaise/fatigue.  Respiratory: Negative for cough and shortness of breath.  Cardiovascular: Negative for chest pain and leg swelling.  Gastrointestinal: Negative for heartburn and abdominal pain.  Musculoskeletal: Negative for joint pain and myalgias.  Skin: Negative.  Neurological: Negative for headaches.  Psychiatric/Behavioral: Negative for depression. The patient does not have insomnia.     Physical Exam  Constitutional:  Morbidly obese  Neck: Neck supple. No JVD present.  Cardiovascular: Normal rate, regular rhythm and intact distal  pulses.  Respiratory: Effort normal and breath sounds normal. No respiratory distress. He has no wheezes.  GI: Soft. Bowel sounds are normal. He exhibits no distension. There is no tenderness.  Musculoskeletal: He exhibits no edema.  Limited range of motion due to obesity  Neurological: He is alert.  Skin: Skin  is warm and dry.  Psychiatric: He has a normal mood and affect.     ASSESSMENT/PLAN  1. Hyperthyroidism: will increase his tapazole to 5 mg twice daily and will check tsh free T3 and free T4 in 6 weeks will monitor his status.   2. Diabetes: is stable will continue metformin 1 gm twice daily; lantus 25 units nightly  and will monitor  3. Diastolic heart failure; is stable will continue aldactone 25 mg daily; lasix 80 mg twice daily with k+ 40 meq twice daily and will monitor   4. Bph: will continue flomax 0.4 mg daily and proscar 5 mg daily and will monitor   5. Afib: his heart rate is stable will continue digoxin 0.375 mg daily for rate control and will pradaxa 150 mg twice daily   6. Hypertension: is stable will continue lisinopril 40 mg daily; clonidine 0.2 mg twice daily and will monitor  7. Chronic pain: will continue his cymbalta 40 mg daily and neurontin 300 mg three times daily and lidoderm 1/2 patch to both lower legs daily and will monitor his status.   8. Depression: will continue his cymbalta 40 mg daily he is emotionally stable at this time  9. Gerd: will continue zantac 150 mg daily  10. Constipation: will continue miralax daily

## 2013-12-24 ENCOUNTER — Non-Acute Institutional Stay (SKILLED_NURSING_FACILITY): Payer: Medicaid Other | Admitting: Nurse Practitioner

## 2013-12-24 DIAGNOSIS — E0590 Thyrotoxicosis, unspecified without thyrotoxic crisis or storm: Secondary | ICD-10-CM

## 2013-12-24 DIAGNOSIS — E1149 Type 2 diabetes mellitus with other diabetic neurological complication: Secondary | ICD-10-CM

## 2013-12-24 DIAGNOSIS — F32A Depression, unspecified: Secondary | ICD-10-CM

## 2013-12-24 DIAGNOSIS — I4891 Unspecified atrial fibrillation: Secondary | ICD-10-CM

## 2013-12-24 DIAGNOSIS — F329 Major depressive disorder, single episode, unspecified: Secondary | ICD-10-CM

## 2013-12-24 DIAGNOSIS — G609 Hereditary and idiopathic neuropathy, unspecified: Secondary | ICD-10-CM

## 2013-12-24 DIAGNOSIS — D649 Anemia, unspecified: Secondary | ICD-10-CM

## 2013-12-24 DIAGNOSIS — I10 Essential (primary) hypertension: Secondary | ICD-10-CM

## 2013-12-24 DIAGNOSIS — K219 Gastro-esophageal reflux disease without esophagitis: Secondary | ICD-10-CM

## 2013-12-24 DIAGNOSIS — N400 Enlarged prostate without lower urinary tract symptoms: Secondary | ICD-10-CM

## 2013-12-24 DIAGNOSIS — I5022 Chronic systolic (congestive) heart failure: Secondary | ICD-10-CM

## 2013-12-24 DIAGNOSIS — K5900 Constipation, unspecified: Secondary | ICD-10-CM

## 2013-12-24 DIAGNOSIS — F3289 Other specified depressive episodes: Secondary | ICD-10-CM

## 2013-12-24 NOTE — Progress Notes (Signed)
Patient ID: [name], male   DOB: Mar 01, 1951, 63 y.o.   MRN: [id number]    Nursing Home Location:  [facility]   Place of Service: SNF (31)  PCP: Terald Sleeper, MD  No Known Allergies  Chief Complaint  Patient presents with  . Medical Managment of Chronic Issues    HPI:  63 year old male being seen today for routine follow up; nursing without major concerns but does report pt with history of 3 days cough and nasal congestion, no fever or chills, no shortness of breath; pt received robitussin today which helped per pt   Physical Exam   Review of Systems:  Review of Systems  Constitutional: Negative for fever, chills and malaise/fatigue.  HENT: Positive for congestion. Negative for ear pain, hearing loss, sore throat and tinnitus.   Respiratory: Positive for cough. Negative for sputum production and shortness of breath.   Cardiovascular: Negative for chest pain and leg swelling.  Gastrointestinal: Negative for heartburn and abdominal pain.  Genitourinary: Positive for frequency (unchanged; history of BPH and on diuretics). Negative for dysuria and urgency.  Musculoskeletal: Negative for joint pain and myalgias.  Skin: Negative.   Neurological: Negative for headaches.  Psychiatric/Behavioral: Negative for depression. The patient does not have insomnia.      Past Medical History  Diagnosis Date  . 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 He had markedly elevated LV filling pressures and pulmonary venous hypertension with normal pulmonary vascular resistance suggesting he will normalize his pulmonary pressures with adequate diuresis.   . Renal insufficiency   . Sleep apnea   . Morbid obesity   . A-fib   . Chronic diastolic heart failure     Echo 8.25.2011:  Mod LVH; EF 50%; Mod LAE; RV dilation and ? dysfxn; mild RAE;   . Other primary  cardiomyopathies   . Dyslipidemia   . Essential hypertension, benign   . Diabetes mellitus without mention of complication   . Atrial fibrillation 08/26/2009    Annotation: Permanent Not a coumadin candidate secondary to noncompliance Qualifier: Diagnosis of  By: Huntley Dec, Scott    . Chronic systolic heart failure 08/26/2009      Qualifier: Diagnosis of  By: Huntley Dec, Scott     . CORONARY ARTERY DISEASE, S/P PTCA 10/08/2009    Qualifier: Diagnosis of  By: Trevor Iha, RN, Heather    . Acute and chronic respiratory failure (acute-on-chronic) 02/07/2013  . HYPERTHYROIDISM 02/23/2011    Qualifier: Diagnosis of  By: Daphine Deutscher FNP, Zena Amos    . BPH (benign prostatic hyperplasia) 07/23/2013  . Depression 03/13/2013  . Chronic pain 03/13/2013  . Hyperkalemia 02/07/2013  . SLEEP APNEA 08/26/2009    Annotation: diagnosed by overnight oximetry in hospital in Aug. 2010 does not want to wear CPAP Qualifier: Diagnosis of  By: Brynda Rim     Past Surgical History  Procedure Laterality Date  . None     Social History:   reports that he has never smoked. He does not have any smokeless tobacco history on file. He reports that he does not drink alcohol or use illicit drugs.  Family History  Problem Relation Age of Onset  . Hypertension      Aunts    Medications: Patient's Medications  New Prescriptions   No medications on file  Previous Medications   CHOLECALCIFEROL 1000 UNITS TABLET    Take 1,000 Units by mouth  daily.   CLONIDINE (CATAPRES) 0.1 MG TABLET    Take 0.2 mg by mouth 2 (two) times daily.    CYANOCOBALAMIN 1000 MCG TABLET    Take 1,000 mcg by mouth daily.    DABIGATRAN (PRADAXA) 150 MG CAPS    Take 1 capsule (150 mg total) by mouth every 12 (twelve) hours.   DIGOXIN (LANOXIN) 0.125 MG TABLET    Take 0.375 mg by mouth daily.   DULOXETINE (CYMBALTA) 20 MG CAPSULE    Take 40 mg by mouth daily.   FERROUS SULFATE 325 (65 FE) MG EC TABLET    Take 325 mg by mouth daily.    FINASTERIDE  (PROSCAR) 5 MG TABLET    Take 5 mg by mouth daily.   FUROSEMIDE (LASIX) 40 MG TABLET    Take 2 tablets (80 mg total) by mouth 2 (two) times daily.   GABAPENTIN (NEURONTIN) 300 MG CAPSULE    Take 300 mg by mouth 3 (three) times daily.   INSULIN GLARGINE (LANTUS) 100 UNIT/ML INJECTION    Inject 25 Units into the skin at bedtime.    LIDOCAINE (LIDODERM) 5 %    Place 1 patch onto the skin daily. 1/2 patch to both shins daily   LISINOPRIL (PRINIVIL,ZESTRIL) 40 MG TABLET    Take 1 tablet (40 mg total) by mouth daily.   MAGNESIUM OXIDE (MAG-OX) 400 MG TABLET    Take 400 mg by mouth 2 (two) times daily.   METFORMIN (GLUCOPHAGE) 500 MG TABLET    Take 1,000 mg by mouth 2 (two) times daily with a meal.   METHIMAZOLE (TAPAZOLE) 10 MG TABLET    Take 0.5 tablets (5 mg total) by mouth daily.   POLYETHYLENE GLYCOL (MIRALAX / GLYCOLAX) PACKET    Take 17 g by mouth at bedtime.    POTASSIUM CHLORIDE SA (K-DUR,KLOR-CON) 20 MEQ TABLET    Take 40 mEq by mouth 2 (two) times daily.    RANITIDINE (ZANTAC) 150 MG TABLET    Take 150 mg by mouth at bedtime.   SODIUM CHLORIDE (OCEAN) 0.65 % NASAL SPRAY    Place 1 spray into the nose 2 (two) times daily.   SPIRONOLACTONE (ALDACTONE) 25 MG TABLET    Take 1 tablet (25 mg total) by mouth daily.   TAMSULOSIN (FLOMAX) 0.4 MG CAPS CAPSULE    Take 0.4 mg by mouth daily.   THIAMINE HCL (THIAMINE PO)    Take 100 mg by mouth daily.   Modified Medications   No medications on file  Discontinued Medications   No medications on file     Physical Exam: Constitutional: Morbidly obese in NAD HEENT: unremarkable Neck: Neck supple. No JVD present.  Cardiovascular: Normal rate, regular rhythm and intact distal pulses.  Respiratory: Effort normal and breath sounds normal. No respiratory distress. He has no wheezes.  GI: Soft. Bowel sounds are normal. He exhibits no distension. There is no tenderness.  Musculoskeletal: He exhibits no edema.  Limited range of motion due to obesity    Neurological: He is alert.  Skin: Skin is warm and dry.  Psychiatric: He has a normal mood and affect   Filed Vitals:   12/24/13 1455  BP: 123/72  Pulse: 100  Temp: 98.7 F (37.1 C)  Resp: 20  SpO2: 94%      Labs reviewed: Basic Metabolic Panel:  Recent Labs  16/09/9601/19/14 0507  02/12/13 0527  02/13/13 0550 02/14/13 0612 02/15/13 0603 02/15/13 0903  NA 140  < > 137  < >  139 138 140 137  K 5.6*  < > 3.5  < > 3.0* 3.0* 3.3* 3.5  CL 103  < > 94*  < > 89* 88* 90* 87*  CO2 27  < > 37*  < > 44* 44* >45* 44*  GLUCOSE 106*  < > 175*  < > 117* 148* 126* 158*  BUN 30*  < > 27*  < > 25* 25* 27* 26*  CREATININE 1.15  < > 1.19  < > 1.18 1.17 1.43* 1.39*  CALCIUM 8.9  < > 9.0  < > 9.2 8.9 8.8 9.0  MG 1.7  < > 1.6  --  1.4* 1.6  --   --   PHOS 4.1  --   --   --   --   --   --   --   < > = values in this interval not displayed. Liver Function Tests:  Recent Labs  02/06/13 1830  AST 19  ALT 9  ALKPHOS 61  BILITOT 0.5  PROT 7.9  ALBUMIN 2.6*   No results found for this basename: LIPASE, AMYLASE,  in the last 8760 hours No results found for this basename: AMMONIA,  in the last 8760 hours CBC:  Recent Labs  02/06/13 1830 02/07/13 0507 02/08/13 0350 02/09/13 0500  WBC 4.5 8.9 5.5 5.6  NEUTROABS 2.8  --   --   --   HGB 14.0 15.4 13.6 14.0  HCT 43.2 46.9 40.8 43.1  MCV 87.8 88.7 85.9 86.2  PLT 191 178 166 148*   Cardiac Enzymes: No results found for this basename: CKTOTAL, CKMB, CKMBINDEX, TROPONINI,  in the last 8760 hours BNP: No components found with this basename: POCBNP,  CBG:  Recent Labs  02/15/13 0631 02/15/13 1124 02/15/13 1632  GLUCAP 119* 153* 175*   TSH: No results found for this basename: TSH,  in the last 8760 hours A1C: Lab Results  Component Value Date   HGBA1C 7.8* 02/22/2011   Lipid Panel: No results found for this basename: CHOL, HDL, LDLCALC, TRIG, CHOLHDL, LDLDIRECT,  in the last 8760 hours  LABS REVIEWED;  05-31-13: glucose 87; bun  20; creat 0.98; k+4.2; na++137; chol 143; ldl 100; trig 89; tsh 0.023;   free T4 1.35; vit d 29; hgb a1c 6.0   06-01-13: glucose 95; bun 16; creat 0.89; k+4.3; na++137; liver normal albumin 3.1; tsh 0.014;   Free T4 1.32; chol 137; ldl 95; trig 82; hgb a1c 6.1; dig 1.4; vit d 30;   06-25-13; tsh 0.010; free T4 1.42; free T3 3.3   07-20-13: glucose 135; bun 17; creat 0.86; k+4.1; na++ 137   08-13-13: tsh 0.009; free T4 2.43; hgb a1c 7.1   09-14-13: tsh 0.008; free T4: 2.44   10-12-13: tsh <0.008; free t4: 2.23; free t3: 7.0   10-15-13: wbc 5.2; hgb 11.9; hct 35.3 ;mcv 79.5; plt 163; glucose 95; bun 24; creat 0.90; k+4.7; na++140; liver normal albumin 3.1; depakote 18.3   11-07-13: tsh <0.008; total t4: 12.6    Assessment/Plan 1. Essential hypertension, benign Stable on lisinopril 40 mg daily; clonidine 0.2 mg twice daily and cont to  will monitor  2. Chronic systolic heart failure -stable on aldactone and lasix  3. Atrial fibrillation -rate controlled; continues on digoxin 0.375 mg daily for rate control and will pradaxa 150 mg twice daily   4. GERD (gastroesophageal reflux disease) -without symptoms on zantac  5. Constipation -stable on miralax   6. HYPERTHYROIDISM  -his tapazole was  5 mg twice daily on last visit -scheduled tsh free T3 and free T4   7. Type II or unspecified type diabetes mellitus with neurological manifestations, not stated as uncontrolled(250.60) -Pt currently on  metformin 1 gm twice daily; lantus 25 units nightly -cbgs remain stable -will follow up A1c   8. BPH (benign prostatic hyperplasia) -stable on flomax and proscar  9. Anemia -will follow up cbc  10. Depression -staff and pt report mood has been good; no depression noted; will decrease cymbalta to 20 mg daily for 3 weeks then dc  11. Unspecified hereditary and idiopathic peripheral neuropathy Pt currently on cymbalta 40 mg daily and neurontin 300 mg three times daily and lidoderm 1/2 patch to both  lower legs daily  -reports neuropathy has been stable -will attempt to titrate and stop cymbalta at this time  12. Cough and congestion -appears viral; conts supportive care -mucinex DM BID for 1 week

## 2014-01-21 ENCOUNTER — Encounter: Payer: Self-pay | Admitting: Nurse Practitioner

## 2014-01-21 ENCOUNTER — Non-Acute Institutional Stay (SKILLED_NURSING_FACILITY): Payer: Medicaid Other | Admitting: Nurse Practitioner

## 2014-01-21 DIAGNOSIS — D649 Anemia, unspecified: Secondary | ICD-10-CM

## 2014-01-21 DIAGNOSIS — F32A Depression, unspecified: Secondary | ICD-10-CM

## 2014-01-21 DIAGNOSIS — I428 Other cardiomyopathies: Secondary | ICD-10-CM

## 2014-01-21 DIAGNOSIS — G8929 Other chronic pain: Secondary | ICD-10-CM

## 2014-01-21 DIAGNOSIS — K219 Gastro-esophageal reflux disease without esophagitis: Secondary | ICD-10-CM

## 2014-01-21 DIAGNOSIS — I5022 Chronic systolic (congestive) heart failure: Secondary | ICD-10-CM

## 2014-01-21 DIAGNOSIS — E0590 Thyrotoxicosis, unspecified without thyrotoxic crisis or storm: Secondary | ICD-10-CM

## 2014-01-21 DIAGNOSIS — F3289 Other specified depressive episodes: Secondary | ICD-10-CM

## 2014-01-21 DIAGNOSIS — N400 Enlarged prostate without lower urinary tract symptoms: Secondary | ICD-10-CM

## 2014-01-21 DIAGNOSIS — E1149 Type 2 diabetes mellitus with other diabetic neurological complication: Secondary | ICD-10-CM

## 2014-01-21 DIAGNOSIS — K5900 Constipation, unspecified: Secondary | ICD-10-CM

## 2014-01-21 DIAGNOSIS — I10 Essential (primary) hypertension: Secondary | ICD-10-CM

## 2014-01-21 DIAGNOSIS — I4891 Unspecified atrial fibrillation: Secondary | ICD-10-CM

## 2014-01-21 DIAGNOSIS — F329 Major depressive disorder, single episode, unspecified: Secondary | ICD-10-CM

## 2014-01-21 NOTE — Progress Notes (Signed)
Patient ID: [name], male   DOB: 04-03-1951, 63 y.o.   MRN: [id number]    Nursing Home Location:  [facility]   Place of Service: SNF (31)  PCP: Terald Sleeper, MD  No Known Allergies  Chief Complaint  Patient presents with  . Medical Managment of Chronic Issues    HPI:  63 year old male being seen today for routine follow up on chronic conditions; nursing without major concerns; recently treated for UTI but without symptoms currently, does not have any complaints today   Review of Systems:  Review of Systems  Constitutional: Negative for fever, chills and malaise/fatigue.  HENT:  Negative for ear pain, hearing loss, sore throat and tinnitus.  Respiratory: NEG for cough. Negative for sputum production and shortness of breath.  Cardiovascular: Negative for chest pain and leg swelling.  Gastrointestinal: Negative for heartburn and abdominal pain.  Genitourinary: Positive for frequency (unchanged; history of BPH and on diuretics). Negative for dysuria and urgency.  Musculoskeletal: Negative for joint pain and myalgias.  Skin: Negative.  Neurological: Negative for headaches.  Psychiatric/Behavioral: Negative for depression. The patient does not have insomnia.       Past Medical History  Diagnosis Date  . 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 He had markedly elevated LV filling pressures and pulmonary venous hypertension with normal pulmonary vascular resistance suggesting he will normalize his pulmonary pressures with adequate diuresis.   . Renal insufficiency   . Sleep apnea   . Morbid obesity   . A-fib   . Chronic diastolic heart failure     Echo 8.25.2011:  Mod LVH; EF 50%; Mod LAE; RV dilation and ? dysfxn; mild RAE;   . Other primary cardiomyopathies   . Dyslipidemia   . Essential hypertension, benign   . Diabetes mellitus  without mention of complication   . Atrial fibrillation 08/26/2009    Annotation: Permanent Not a coumadin candidate secondary to noncompliance Qualifier: Diagnosis of  By: Huntley Dec, Scott    . Chronic systolic heart failure 08/26/2009      Qualifier: Diagnosis of  By: Huntley Dec, Scott     . CORONARY ARTERY DISEASE, S/P PTCA 10/08/2009    Qualifier: Diagnosis of  By: Trevor Iha, RN, Heather    . Acute and chronic respiratory failure (acute-on-chronic) 02/07/2013  . HYPERTHYROIDISM 02/23/2011    Qualifier: Diagnosis of  By: Daphine Deutscher FNP, Zena Amos    . BPH (benign prostatic hyperplasia) 07/23/2013  . Depression 03/13/2013  . Chronic pain 03/13/2013  . Hyperkalemia 02/07/2013  . SLEEP APNEA 08/26/2009    Annotation: diagnosed by overnight oximetry in hospital in Aug. 2010 does not want to wear CPAP Qualifier: Diagnosis of  By: Brynda Rim     Past Surgical History  Procedure Laterality Date  . None     Social History:   reports that he has never smoked. He does not have any smokeless tobacco history on file. He reports that he does not drink alcohol or use illicit drugs.  Family History  Problem Relation Age of Onset  . Hypertension      Aunts    Medications: Patient's Medications  New Prescriptions   No medications on file  Previous Medications   CHOLECALCIFEROL 1000 UNITS TABLET    Take 1,000 Units by mouth daily.   CLONIDINE (CATAPRES) 0.1 MG TABLET    Take 0.2 mg by mouth 2 (two) times daily.  CYANOCOBALAMIN 1000 MCG TABLET    Take 1,000 mcg by mouth daily.    DABIGATRAN (PRADAXA) 150 MG CAPS    Take 1 capsule (150 mg total) by mouth every 12 (twelve) hours.   DIGOXIN (LANOXIN) 0.125 MG TABLET    Take 0.375 mg by mouth daily.   FERROUS SULFATE 325 (65 FE) MG EC TABLET    Take 325 mg by mouth daily.    FINASTERIDE (PROSCAR) 5 MG TABLET    Take 5 mg by mouth daily.   FUROSEMIDE (LASIX) 40 MG TABLET    Take 2 tablets (80 mg total) by mouth 2 (two) times daily.   GABAPENTIN  (NEURONTIN) 300 MG CAPSULE    Take 300 mg by mouth 3 (three) times daily.   INSULIN GLARGINE (LANTUS) 100 UNIT/ML INJECTION    Inject 25 Units into the skin at bedtime.    LIDOCAINE (LIDODERM) 5 %    Place 1 patch onto the skin daily. 1/2 patch to both shins daily   LISINOPRIL (PRINIVIL,ZESTRIL) 40 MG TABLET    Take 1 tablet (40 mg total) by mouth daily.   MAGNESIUM OXIDE (MAG-OX) 400 MG TABLET    Take 400 mg by mouth 2 (two) times daily.   METFORMIN (GLUCOPHAGE) 500 MG TABLET    Take 1,000 mg by mouth 2 (two) times daily with a meal.   METHIMAZOLE (TAPAZOLE) 10 MG TABLET    Take 0.5 tablets (5 mg total) by mouth daily.   POLYETHYLENE GLYCOL (MIRALAX / GLYCOLAX) PACKET    Take 17 g by mouth at bedtime.    POTASSIUM CHLORIDE SA (K-DUR,KLOR-CON) 20 MEQ TABLET    Take 40 mEq by mouth 2 (two) times daily.    RANITIDINE (ZANTAC) 150 MG TABLET    Take 150 mg by mouth at bedtime.   SODIUM CHLORIDE (OCEAN) 0.65 % NASAL SPRAY    Place 1 spray into the nose 2 (two) times daily.   SPIRONOLACTONE (ALDACTONE) 25 MG TABLET    Take 1 tablet (25 mg total) by mouth daily.   TAMSULOSIN (FLOMAX) 0.4 MG CAPS CAPSULE    Take 0.4 mg by mouth daily.   THIAMINE HCL (THIAMINE PO)    Take 100 mg by mouth daily.   Modified Medications   No medications on file  Discontinued Medications   DULOXETINE (CYMBALTA) 20 MG CAPSULE    Take 40 mg by mouth daily.     Physical Exam:  Filed Vitals:   01/21/14 1443  BP: 122/76  Pulse: 87  Temp: 98 F (36.7 C)  Resp: 20    Physical Exam Constitutional: Morbidly obese in NAD  HEENT: unremarkable  Neck: Neck supple. No JVD present.  Cardiovascular: Normal rate, regular rhythm and intact distal pulses.  Respiratory: Effort normal and breath sounds normal. No respiratory distress. He has no wheezes.  GI: Soft. Bowel sounds are normal. He exhibits no distension. There is no tenderness.  Musculoskeletal: He exhibits no edema.  Limited range of motion due to obesity    Neurological: He is alert.  Skin: Skin is warm and dry.  Psychiatric: He has a normal mood and affect   Labs reviewed: Basic Metabolic Panel:  Recent Labs  06/14/93 0507  02/12/13 0527  02/13/13 0550 02/14/13 0612 02/15/13 0603 02/15/13 0903  NA 140  < > 137  < > 139 138 140 137  K 5.6*  < > 3.5  < > 3.0* 3.0* 3.3* 3.5  CL 103  < > 94*  < >  89* 88* 90* 87*  CO2 27  < > 37*  < > 44* 44* >45* 44*  GLUCOSE 106*  < > 175*  < > 117* 148* 126* 158*  BUN 30*  < > 27*  < > 25* 25* 27* 26*  CREATININE 1.15  < > 1.19  < > 1.18 1.17 1.43* 1.39*  CALCIUM 8.9  < > 9.0  < > 9.2 8.9 8.8 9.0  MG 1.7  < > 1.6  --  1.4* 1.6  --   --   PHOS 4.1  --   --   --   --   --   --   --   < > = values in this interval not displayed. Liver Function Tests:  Recent Labs  02/06/13 1830  AST 19  ALT 9  ALKPHOS 61  BILITOT 0.5  PROT 7.9  ALBUMIN 2.6*   No results found for this basename: LIPASE, AMYLASE,  in the last 8760 hours No results found for this basename: AMMONIA,  in the last 8760 hours CBC:  Recent Labs  02/06/13 1830 02/07/13 0507 02/08/13 0350 02/09/13 0500  WBC 4.5 8.9 5.5 5.6  NEUTROABS 2.8  --   --   --   HGB 14.0 15.4 13.6 14.0  HCT 43.2 46.9 40.8 43.1  MCV 87.8 88.7 85.9 86.2  PLT 191 178 166 148*   Cardiac Enzymes: No results found for this basename: CKTOTAL, CKMB, CKMBINDEX, TROPONINI,  in the last 8760 hours BNP: No components found with this basename: POCBNP,  CBG:  Recent Labs  02/15/13 0631 02/15/13 1124 02/15/13 1632  GLUCAP 119* 153* 175*   TSH: No results found for this basename: TSH,  in the last 8760 hours A1C: Lab Results  Component Value Date   HGBA1C 7.8* 02/22/2011     Assessment/Plan 1. Essential hypertension, benign -stable; on current medications  2. GERD (gastroesophageal reflux disease) -will dc Zantac and follow up symptoms  3. Constipation With episodes of loose stools; will dc miralax and order colace BID   4. Type II or  unspecified type diabetes mellitus with neurological manifestations, not stated as uncontrolled(250.60) -A1c at goal; no hypoglycemic episodes; will cont current medications  5. HYPERTHYROIDISM -TSH remains unchanged; will get endocrinology referral   6. Chronic pain -remains stable  7. Anemia -pt has been on iron since last feb  8. Depression -pt with stable mood  9. BPH (benign prostatic hyperplasia) -stable on flomax/ proscar   10. Atrial fibrillation -rate controlled; on dig, will get dig level -currently on pradaxa   11. Chronic systolic heart failure -well controlled; conts lasix and aldactone

## 2014-02-04 ENCOUNTER — Encounter: Payer: Self-pay | Admitting: Endocrinology

## 2014-02-04 ENCOUNTER — Ambulatory Visit (INDEPENDENT_AMBULATORY_CARE_PROVIDER_SITE_OTHER): Payer: Medicaid Other | Admitting: Endocrinology

## 2014-02-04 VITALS — BP 118/76 | HR 71 | Temp 98.1°F | Resp 14 | Ht 64.0 in | Wt 313.0 lb

## 2014-02-04 DIAGNOSIS — E0590 Thyrotoxicosis, unspecified without thyrotoxic crisis or storm: Secondary | ICD-10-CM

## 2014-02-04 DIAGNOSIS — E042 Nontoxic multinodular goiter: Secondary | ICD-10-CM

## 2014-02-04 NOTE — Progress Notes (Signed)
Patient ID: [name], male   DOB: Feb 03, 1951, 63 y.o.   MRN: [id number]                                                                                                           Reather Littler M.D.             [facility]   [name]  Reason for Appointment:  Hyperthyroidism, new consultation    History of Present Illness:   The Hyperthyroidism  diagnosis was made probably in early 2014. No details are available since apparently he was transferred to his current nursing home already taking methimazole 10 mg daily in early 2014 Not clear if he had any symptoms at diagnosis and he does not think he had any of the usual symptoms of  palpitations, shakiness, nervousness, feeling excessively warm and sweaty or fatigue. He does have a previous history of atrial fibrillation He had been losing weight gradually over the last 3 years with a baseline weight of about 400 pounds; is still losing weight but not at a faster pace. Also in the interim has been treated with Lasix for CHF His appetite is fairly good Apparently in 7/14 because of normal T4 and T3 his methimazole was tapered off Subsequently in 10/14 his free T4 increased to 2.2 and free T3 increased to 7.0 and methimazole was restarted He does not think he feels any different with or without the medication He has been continued on methimazole 10 mg and record indicates he is taking half tablet daily Because of continued abnormal thyroid levels he is now referred her for further management   Recent labs: Total T4 in 11/14 was 12.6 and TSH <0.008; in 12/14 free T4 was 1.75, normal and free T3 was 4.3, high with low TSH  Problem 2: THYROID nodule He apparently had a thyroid nodule in 2012 measuring about 3.5 cm. Followup ultrasound in 06/2013 showed the following: Focal nodules: Lower pole right-sided nodule which is mildly hyperechoic. 1.2 x 1.3 x 1.2 cm. Similar to minimally enlarged from 1.0 x 0.9 x 1.0 cm on the prior  exam.  An inter/upper pole minimally hypoechoic right-sided nodule measures 9 x 6 x 7 mm. Not readily apparent on the prior exam.  Left-sided inter/lower pole heterogeneous solid mass. 3.5 x 3.0 x 3.0 cm. On the prior exam, this measured 3.5 x 2.3 x 2.3 cm.  No biopsy has been done.    Medication List       This list is accurate as of: 02/04/14  2:23 PM.  Always use your most recent med list.               Cholecalciferol 1000 UNITS tablet  Take 1,000 Units by mouth daily.     cloNIDine 0.1 MG tablet  Commonly known as:  CATAPRES  Take 0.2 mg by mouth 2 (two) times daily.     cyanocobalamin 1000 MCG tablet  Take 1,000 mcg by mouth daily.     dabigatran 150 MG Caps capsule  Commonly known as:  PRADAXA  Take 1 capsule (150 mg total) by mouth every 12 (twelve) hours.     digoxin 0.125 MG tablet  Commonly known as:  LANOXIN  Take 0.375 mg by mouth daily.     ferrous sulfate 325 (65 FE) MG EC tablet  Take 325 mg by mouth daily.     finasteride 5 MG tablet  Commonly known as:  PROSCAR  Take 5 mg by mouth daily.     furosemide 40 MG tablet  Commonly known as:  LASIX  Take 2 tablets (80 mg total) by mouth 2 (two) times daily.     gabapentin 300 MG capsule  Commonly known as:  NEURONTIN  Take 300 mg by mouth 3 (three) times daily.     insulin glargine 100 UNIT/ML injection  Commonly known as:  LANTUS  Inject 25 Units into the skin at bedtime.     lidocaine 5 %  Commonly known as:  LIDODERM  Place 1 patch onto the skin daily. 1/2 patch to both shins daily     lisinopril 40 MG tablet  Commonly known as:  PRINIVIL,ZESTRIL  Take 1 tablet (40 mg total) by mouth daily.     magnesium oxide 400 MG tablet  Commonly known as:  MAG-OX  Take 400 mg by mouth 2 (two) times daily.     metFORMIN 500 MG tablet  Commonly known as:  GLUCOPHAGE  Take 1,000 mg by mouth 2 (two) times daily with a meal.     methimazole 10 MG tablet  Commonly known as:  TAPAZOLE  Take 0.5 tablets  (5 mg total) by mouth daily.     polyethylene glycol packet  Commonly known as:  MIRALAX / GLYCOLAX  Take 17 g by mouth at bedtime.     potassium chloride SA 20 MEQ tablet  Commonly known as:  K-DUR,KLOR-CON  Take 40 mEq by mouth 2 (two) times daily.     ranitidine 150 MG tablet  Commonly known as:  ZANTAC  Take 150 mg by mouth at bedtime.     sodium chloride 0.65 % nasal spray  Commonly known as:  OCEAN  Place 1 spray into the nose 2 (two) times daily.     spironolactone 25 MG tablet  Commonly known as:  ALDACTONE  Take 1 tablet (25 mg total) by mouth daily.     tamsulosin 0.4 MG Caps capsule  Commonly known as:  FLOMAX  Take 0.4 mg by mouth daily.     THIAMINE PO  Take 100 mg by mouth daily.            Past Medical History  Diagnosis Date  . 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 He had markedly elevated LV filling pressures and pulmonary venous hypertension with normal pulmonary vascular resistance suggesting he will normalize his pulmonary pressures with adequate diuresis.   . Renal insufficiency   . Sleep apnea   . Morbid obesity   . A-fib   . Chronic diastolic heart failure     Echo 8.25.2011:  Mod LVH; EF 50%; Mod LAE; RV dilation and ? dysfxn; mild RAE;   . Other primary cardiomyopathies   . Dyslipidemia   . Essential hypertension, benign   . Diabetes mellitus without mention of complication   . Atrial fibrillation 08/26/2009    Annotation: Permanent Not a coumadin candidate secondary to noncompliance Qualifier: Diagnosis of  By: Huntley DecWeaver PA-C, Scott    .  Chronic systolic heart failure 08/26/2009      Qualifier: Diagnosis of  By: Huntley Dec, Scott     . CORONARY ARTERY DISEASE, S/P PTCA 10/08/2009    Qualifier: Diagnosis of  By: Trevor Iha, RN, Heather    . Acute and chronic respiratory failure (acute-on-chronic) 02/07/2013  . HYPERTHYROIDISM 02/23/2011     Qualifier: Diagnosis of  By: Daphine Deutscher FNP, Zena Amos    . BPH (benign prostatic hyperplasia) 07/23/2013  . Depression 03/13/2013  . Chronic pain 03/13/2013  . Hyperkalemia 02/07/2013  . SLEEP APNEA 08/26/2009    Annotation: diagnosed by overnight oximetry in hospital in Aug. 2010 does not want to wear CPAP Qualifier: Diagnosis of  By: Brynda Rim      Past Surgical History  Procedure Laterality Date  . None      Family History  Problem Relation Age of Onset  . Hypertension      Aunts    Social History:  reports that he has never smoked. He does not have any smokeless tobacco history on file. He reports that he does not drink alcohol or use illicit drugs.  Allergies: No Known Allergies  Review of Systems:  CARDIOLOGY:  he has a  history of high blood pressure.  Also apparently has had CHF but recently asymptomatic. History of atrial fibrillation Has history of renal insufficiency      RESPIRATORY:  no dyspnea on exertion.      GASTROENTEROLOGY:  no Change in bowel habits.      ENDOCRINOLOGY:  he has had long-standing diabetes, last A1c was 6.5 in 1/15 He says he has had significant peripheral neuropathy in his legs causing weakness and is using a wheelchair to ambulate.            Examination:   BP 118/76  Pulse 71  Temp(Src) 98.1 F (36.7 C)  Resp 14  Ht 5\' 4"  (1.626 m)  Wt 313 lb (141.976 kg)  BMI 53.70 kg/m2  SpO2 95%   General Appearance:  he has marked generalized obesity. He is alert and oriented but at times slightly somnolent  Eyes: No excessive prominence, lid lag; mild stare present. No edema of the eyelids  Neck: The thyroid is just palpable on the right side on swallowing. Has about a 3 cm smooth rounded nodules felt on swallowing on the left side medially  There is no lymphadenopathy .          Heart: normal S1 and S2, no murmurs .         Lungs: breath sounds are clear bilaterally  Extremities: hands are normal to touch. No ankle edema. Neurological:  REFLEXES: In upper extremities are difficult to elicit.  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

## 2014-02-06 LAB — T4, FREE: Free T4: 1.91 ng/dL — ABNORMAL HIGH (ref 0.60–1.60)

## 2014-02-06 LAB — TSH: TSH: 0.02 u[IU]/mL — ABNORMAL LOW (ref 0.35–5.50)

## 2014-02-06 LAB — T3, FREE: T3, Free: 5 pg/mL — ABNORMAL HIGH (ref 2.3–4.2)

## 2014-02-14 ENCOUNTER — Ambulatory Visit (HOSPITAL_COMMUNITY): Payer: Medicaid Other

## 2014-02-15 ENCOUNTER — Other Ambulatory Visit (HOSPITAL_COMMUNITY): Payer: Medicaid Other

## 2014-02-18 ENCOUNTER — Non-Acute Institutional Stay (SKILLED_NURSING_FACILITY): Payer: Medicaid Other | Admitting: Nurse Practitioner

## 2014-02-18 DIAGNOSIS — K5900 Constipation, unspecified: Secondary | ICD-10-CM

## 2014-02-18 DIAGNOSIS — I5022 Chronic systolic (congestive) heart failure: Secondary | ICD-10-CM

## 2014-02-18 DIAGNOSIS — G8929 Other chronic pain: Secondary | ICD-10-CM

## 2014-02-18 DIAGNOSIS — I509 Heart failure, unspecified: Secondary | ICD-10-CM

## 2014-02-18 DIAGNOSIS — I10 Essential (primary) hypertension: Secondary | ICD-10-CM

## 2014-02-18 DIAGNOSIS — R32 Unspecified urinary incontinence: Secondary | ICD-10-CM

## 2014-02-18 DIAGNOSIS — K219 Gastro-esophageal reflux disease without esophagitis: Secondary | ICD-10-CM

## 2014-02-18 DIAGNOSIS — I4891 Unspecified atrial fibrillation: Secondary | ICD-10-CM

## 2014-02-18 DIAGNOSIS — F32A Depression, unspecified: Secondary | ICD-10-CM

## 2014-02-18 DIAGNOSIS — E0590 Thyrotoxicosis, unspecified without thyrotoxic crisis or storm: Secondary | ICD-10-CM

## 2014-02-18 DIAGNOSIS — I2510 Atherosclerotic heart disease of native coronary artery without angina pectoris: Secondary | ICD-10-CM

## 2014-02-18 DIAGNOSIS — F3289 Other specified depressive episodes: Secondary | ICD-10-CM

## 2014-02-18 DIAGNOSIS — F329 Major depressive disorder, single episode, unspecified: Secondary | ICD-10-CM

## 2014-02-18 DIAGNOSIS — I428 Other cardiomyopathies: Secondary | ICD-10-CM

## 2014-02-18 DIAGNOSIS — I5033 Acute on chronic diastolic (congestive) heart failure: Secondary | ICD-10-CM

## 2014-02-18 NOTE — Progress Notes (Signed)
Patient ID: [name], male   DOB: 01/28/1951, 63 y.o.   MRN: [id number]   Nurs[facility]  [facility]   Place of Service: SNF (31)  PCP: Terald SleeperOBSON,MICHAEL GAVIN, MD  No Known Allergies  Chief Complaint  Patient presents with  . Medical Managment of Chronic Issues    HPI:  63 year old male patient seen for routine visit and medical management of multiple co-morbidities including T2DM, Obesity,HTN, and CHF. He also has a history of HLD and BPH. He is being followed by endocrinology for Hyperthyroidism. [name] has had a recent UTI and it has clinically resolved after a course of antibiotics for 1 week. He c/o sacral pain and there is a small area of excoriation at his anal opening that the staff has been using a barrier cream to protect. He has not had diarrhea, but does have occasional contstipation. No hemorrhoids or bleeding are present. Otherwise, there have been no changes in his condition. Reports his mood has been good and he is participating in facility activities.   Review of Systems:  Review of Systems  Constitutional: Negative for fever, chills, weight loss and malaise/fatigue.  HENT: Negative.   Eyes: Negative.   Respiratory: Negative for cough, hemoptysis and shortness of breath.   Cardiovascular: Negative for chest pain, palpitations, orthopnea and leg swelling.  Gastrointestinal: Negative for heartburn, nausea, vomiting, abdominal pain, diarrhea, blood in stool and melena. Constipation: occasional.  Genitourinary: Negative for dysuria, urgency, frequency, hematuria and flank pain.  Musculoskeletal: Negative for back pain, falls and myalgias.  Skin:       Reports sacral pain at the site of small area of pressure at anus.  Neurological: Negative.   Endo/Heme/Allergies: Negative.   Psychiatric/Behavioral: Negative for depression, suicidal ideas, hallucinations and memory loss. The patient is not nervous/anxious and does not have insomnia.       Past Medical History  Diagnosis Date  . 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 He had markedly elevated LV filling pressures and pulmonary venous hypertension with normal pulmonary vascular resistance suggesting he will normalize his pulmonary pressures with adequate diuresis.   . Renal insufficiency   . Sleep apnea   . Morbid obesity   . A-fib   . Chronic diastolic heart failure     Echo 8.25.2011:  Mod LVH; EF 50%; Mod LAE; RV dilation and ? dysfxn; mild RAE;   . Other primary cardiomyopathies   . Dyslipidemia   . Essential hypertension, benign   . Diabetes mellitus without mention of complication   . Atrial fibrillation 08/26/2009    Annotation: Permanent Not a coumadin candidate secondary to noncompliance Qualifier: Diagnosis of  By: Huntley DecWeaver PA-C, Scott    . Chronic systolic heart failure 08/26/2009      Qualifier: Diagnosis of  By: Huntley DecWeaver PA-C, Scott     . CORONARY ARTERY DISEASE, S/P PTCA 10/08/2009    Qualifier: Diagnosis of  By: Trevor IhaSchub, RN, Heather    . Acute and chronic respiratory failure (acute-on-chronic) 02/07/2013  . HYPERTHYROIDISM 02/23/2011    Qualifier: Diagnosis of  By: Daphine DeutscherMartin FNP, Zena AmosNykedtra    . BPH (benign prostatic hyperplasia) 07/23/2013  . Depression 03/13/2013  . Chronic pain 03/13/2013  . Hyperkalemia 02/07/2013  . SLEEP APNEA 08/26/2009    Annotation: diagnosed by overnight oximetry in hospital in Aug. 2010 does not want to wear CPAP Qualifier: Diagnosis of  By: Huntley DecWeaver PA-C, Scott  Past Surgical History  Procedure Laterality Date  . None     Social History:   reports that he has never smoked. He does not have any smokeless tobacco history on file. He reports that he does not drink alcohol or use illicit drugs.  Family History  Problem Relation Age of Onset  . Hypertension      Aunts    Medications: Patient's Medications  New Prescriptions    No medications on file  Previous Medications   CHOLECALCIFEROL 1000 UNITS TABLET    Take 1,000 Units by mouth daily.   CLONIDINE (CATAPRES) 0.1 MG TABLET    Take 0.2 mg by mouth 2 (two) times daily.    CYANOCOBALAMIN 1000 MCG TABLET    Take 1,000 mcg by mouth daily.    DABIGATRAN (PRADAXA) 150 MG CAPS    Take 1 capsule (150 mg total) by mouth every 12 (twelve) hours.   DIGOXIN (LANOXIN) 0.125 MG TABLET    Take 0.375 mg by mouth daily.   FERROUS SULFATE 325 (65 FE) MG EC TABLET    Take 325 mg by mouth daily.    FINASTERIDE (PROSCAR) 5 MG TABLET    Take 5 mg by mouth daily.   FUROSEMIDE (LASIX) 40 MG TABLET    Take 2 tablets (80 mg total) by mouth 2 (two) times daily.   GABAPENTIN (NEURONTIN) 300 MG CAPSULE    Take 300 mg by mouth 3 (three) times daily.   INSULIN GLARGINE (LANTUS) 100 UNIT/ML INJECTION    Inject 25 Units into the skin at bedtime.    LIDOCAINE (LIDODERM) 5 %    Place 1 patch onto the skin daily. 1/2 patch to both shins daily   LISINOPRIL (PRINIVIL,ZESTRIL) 40 MG TABLET    Take 1 tablet (40 mg total) by mouth daily.   MAGNESIUM OXIDE (MAG-OX) 400 MG TABLET    Take 400 mg by mouth 2 (two) times daily.   METFORMIN (GLUCOPHAGE) 500 MG TABLET    Take 1,000 mg by mouth 2 (two) times daily with a meal.   METHIMAZOLE (TAPAZOLE) 10 MG TABLET    Take 0.5 tablets (5 mg total) by mouth daily.   POLYETHYLENE GLYCOL (MIRALAX / GLYCOLAX) PACKET    Take 17 g by mouth at bedtime.    POTASSIUM CHLORIDE SA (K-DUR,KLOR-CON) 20 MEQ TABLET    Take 40 mEq by mouth 2 (two) times daily.    RANITIDINE (ZANTAC) 150 MG TABLET    Take 150 mg by mouth at bedtime.   SODIUM CHLORIDE (OCEAN) 0.65 % NASAL SPRAY    Place 1 spray into the nose 2 (two) times daily.   SPIRONOLACTONE (ALDACTONE) 25 MG TABLET    Take 1 tablet (25 mg total) by mouth daily.   TAMSULOSIN (FLOMAX) 0.4 MG CAPS CAPSULE    Take 0.4 mg by mouth daily.   THIAMINE HCL (THIAMINE PO)    Take 100 mg by mouth daily.   Modified Medications   No  medications on file  Discontinued Medications   No medications on file     Physical Exam: Physical Exam  Constitutional: He is oriented to person, place, and time. No distress.  HENT:  Head: Normocephalic and atraumatic.  Eyes: Pupils are equal, round, and reactive to light.  Neck: Neck supple. No tracheal deviation present. No thyromegaly present.  Cardiovascular: Normal rate, regular rhythm and intact distal pulses.  Exam reveals no gallop and no friction rub.   No murmur heard. Pulmonary/Chest: Effort normal and breath sounds  normal. No respiratory distress. He has no wheezes.  Abdominal: Soft. Distention: obese. There is no tenderness.  Musculoskeletal: Normal range of motion. He exhibits no tenderness.  Neurological: He is alert and oriented to person, place, and time. Gait normal. Coordination normal. GCS score is 15.  Skin: Skin is warm and dry. Lesion (anal opening, approx 1-2 cm round;no infection noted) noted. No rash noted. He is not diaphoretic. No pallor. Nails show no clubbing.  Psychiatric: Mood, memory, affect and judgment normal.    Filed Vitals:   02/18/14 1523  BP: 109/40  Pulse: 73  Temp: 97.7 F (36.5 C)  Resp: 18  Weight: 314 lb (142.429 kg)      Labs reviewed: Digoxin    Result: 01/24/2014 8:32 AM   ( Status: F )     C Digoxin 1.7     0.8-2.0 ng/mL SLN   -- END OF REPORT --  CBC NO Diff (Complete Blood Count)    Result: 01/04/2014 9:58 AM   ( Status: F )     C WBC 6.9     4.0-10.5 K/uL SLN   RBC 4.49     4.22-5.81 MIL/uL SLN   Hemoglobin 12.1   L 13.0-17.0 g/dL SLN   Hematocrit 16.1   L 39.0-52.0 % SLN   MCV 81.3     78.0-100.0 fL SLN   MCH 26.9     26.0-34.0 pg SLN   MCHC 33.2     30.0-36.0 g/dL SLN   RDW 09.6     04.5-40.9 % SLN   Platelet Count 226     150-400 K/uL SLN   Basic Metabolic Panel    Result: 01/04/2014 10:47 AM   ( Status: F )       Sodium 135     135-145 mEq/L SLN   Potassium 4.6     3.5-5.3 mEq/L SLN   Chloride 102      96-112 mEq/L SLN   CO2 24     19-32 mEq/L SLN   Glucose 131   H 70-99 mg/dL SLN   BUN 31   H 8-11 mg/dL SLN   Creatinine 9.14     0.50-1.35 mg/dL SLN   Calcium 9.2     7.8-29.5 mg/dL SLN   Lipid Profile    Result: 01/04/2014 10:47 AM   ( Status: F )       Cholesterol 149     0-200 mg/dL SLN C Triglyceride 621   H <150 mg/dL SLN   HDL Cholesterol 20   L >39 mg/dL SLN   Total Chol/HDL Ratio 7.5      Ratio SLN   VLDL Cholesterol (Calc) 37     0-40 mg/dL SLN   LDL Cholesterol (Calc) 92     0-99 mg/dL SLN C TSH, Ultrasensitive    Result: 01/04/2014 11:17 AM   ( Status: F )       TSH <0.008   L 0.350-4.500 uIU/mL SLN   T4, Free    Result: 01/04/2014 11:17 AM   ( Status: F )       Free T4 1.47     0.80-1.80 ng/dL SLN   Hemoglobin H0Q    Result: 01/04/2014 3:54 PM   ( Status: F )       Hemoglobin A1C 6.5   H <5.7 % SLN C Estimated Average Glucose 140   H <117 mg/dL SLN   T3, Free    Result: 01/04/2014 11:17 AM   ( Status: F )  T3, Free 4.2     2.3-4.2 pg/mL SLN   -- END OF REPORT --  Assessment/Plan 1. Essential hypertension, benign BP remains controlled on current regimen. Will continue Catapres and lisinopril at current doses.  2. CARDIOMYOPATHY, PRIMARY Patient continues on Lasix and Aldactone. No acute events and patient is energetic. Will continue current treatment regimen.  3. Atrial fibrillation Is rate controlled and without rapid ventricular response; will continue on Lanoxin dose.  4. Chronic systolic heart failure Patient continues on diuretics and BP management. No recent CHF exacerbations.  5. HYPERTHYROIDISM Followed by endocrinology for management.  6. GERD (gastroesophageal reflux disease) Stable on Zantac; will continue current dose.   7. Constipation Currently stable on Miralax. Patient aware to report constipation to nursing staff. Continues to drink water regularly and remains hydrated.  8. Depression Patient's mood improved. Is interactive and joining  in activities at facility and interacting with staff.   9. Urinary incontinence Continues to require adult briefs. Regimen includes Proscar and Flomax. recent UTI treated and resolved;  takes Lasix routinely. Size and medication regimen make it difficult for patient to toilet himself consistently.  10. Pressure Area to Anal Opening Treatment nurse to follow. Recommend use of barrier cream prn.    Candelaria Celeste, NP

## 2014-02-28 ENCOUNTER — Ambulatory Visit (HOSPITAL_COMMUNITY)
Admission: RE | Admit: 2014-02-28 | Discharge: 2014-02-28 | Disposition: A | Discharge status: Home / Self Care | Payer: Medicaid Other | Source: Ambulatory Visit | Attending: Endocrinology | Admitting: Endocrinology

## 2014-02-28 DIAGNOSIS — E0590 Thyrotoxicosis, unspecified without thyrotoxic crisis or storm: Secondary | ICD-10-CM

## 2014-02-28 DIAGNOSIS — E042 Nontoxic multinodular goiter: Secondary | ICD-10-CM

## 2014-02-28 MED ORDER — SODIUM IODIDE I 131 CAPSULE
12.8000 | Freq: Once | INTRAVENOUS | Status: AC | PRN
  Administered 2014-02-28: 12.8 via ORAL

## 2014-03-01 ENCOUNTER — Encounter (HOSPITAL_COMMUNITY)
Admission: RE | Admit: 2014-03-01 | Discharge: 2014-03-01 | Disposition: A | Discharge status: Home / Self Care | Payer: Medicaid Other | Source: Ambulatory Visit | Attending: Endocrinology | Admitting: Endocrinology

## 2014-03-01 DIAGNOSIS — E042 Nontoxic multinodular goiter: Secondary | ICD-10-CM | POA: Insufficient documentation

## 2014-03-01 DIAGNOSIS — E0590 Thyrotoxicosis, unspecified without thyrotoxic crisis or storm: Secondary | ICD-10-CM | POA: Insufficient documentation

## 2014-03-01 MED ORDER — SODIUM IODIDE I 131 CAPSULE
12.7600 | Freq: Once | INTRAVENOUS | Status: AC | PRN
  Administered 2014-03-01: 12.76 via ORAL

## 2014-03-01 MED ORDER — SODIUM PERTECHNETATE TC 99M INJECTION
10.0000 | Freq: Once | INTRAVENOUS | Status: AC | PRN
  Administered 2014-03-01: 10 via INTRAVENOUS

## 2014-03-05 ENCOUNTER — Other Ambulatory Visit: Payer: Self-pay | Admitting: Endocrinology

## 2014-03-05 DIAGNOSIS — E0590 Thyrotoxicosis, unspecified without thyrotoxic crisis or storm: Secondary | ICD-10-CM

## 2014-03-06 ENCOUNTER — Other Ambulatory Visit: Payer: Medicaid Other

## 2014-03-06 ENCOUNTER — Telehealth: Payer: Self-pay | Admitting: Endocrinology

## 2014-03-06 ENCOUNTER — Ambulatory Visit: Payer: Medicaid Other | Admitting: Endocrinology

## 2014-03-06 NOTE — Telephone Encounter (Signed)
Please see below, and advise.  I've explained it to him several times today, and explained it to the nurse at the nursing home.  I'm not sure what else to tell him.

## 2014-03-06 NOTE — Telephone Encounter (Signed)
Pt wants to know what the treatment is for. What are the results of his scan. Why is he having the treatment and will you please send over the orders for the treatment [phone] fax

## 2014-03-11 ENCOUNTER — Encounter (HOSPITAL_COMMUNITY): Payer: Self-pay

## 2014-03-11 ENCOUNTER — Encounter (HOSPITAL_COMMUNITY)
Admission: RE | Admit: 2014-03-11 | Discharge: 2014-03-11 | Disposition: A | Discharge status: Home / Self Care | Payer: Medicaid Other | Source: Ambulatory Visit | Attending: Endocrinology | Admitting: Endocrinology

## 2014-03-11 DIAGNOSIS — E0590 Thyrotoxicosis, unspecified without thyrotoxic crisis or storm: Secondary | ICD-10-CM | POA: Insufficient documentation

## 2014-03-11 MED ORDER — SODIUM IODIDE I 131 CAPSULE
20.9000 | Freq: Once | INTRAVENOUS | Status: AC | PRN
  Administered 2014-03-11: 20.9 via ORAL

## 2014-04-01 ENCOUNTER — Non-Acute Institutional Stay (SKILLED_NURSING_FACILITY): Payer: Medicaid Other | Admitting: Nurse Practitioner

## 2014-04-01 DIAGNOSIS — N400 Enlarged prostate without lower urinary tract symptoms: Secondary | ICD-10-CM

## 2014-04-01 DIAGNOSIS — E1149 Type 2 diabetes mellitus with other diabetic neurological complication: Secondary | ICD-10-CM

## 2014-04-01 DIAGNOSIS — I10 Essential (primary) hypertension: Secondary | ICD-10-CM

## 2014-04-01 DIAGNOSIS — K219 Gastro-esophageal reflux disease without esophagitis: Secondary | ICD-10-CM

## 2014-04-01 DIAGNOSIS — K5900 Constipation, unspecified: Secondary | ICD-10-CM

## 2014-04-01 DIAGNOSIS — E0590 Thyrotoxicosis, unspecified without thyrotoxic crisis or storm: Secondary | ICD-10-CM

## 2014-04-01 DIAGNOSIS — I4891 Unspecified atrial fibrillation: Secondary | ICD-10-CM

## 2014-04-01 DIAGNOSIS — I5022 Chronic systolic (congestive) heart failure: Secondary | ICD-10-CM

## 2014-04-01 NOTE — Progress Notes (Signed)
Patient ID: [name], male   DOB: 03-22-1951, 63 y.o.   MRN: [id number]    Nursing Home Location:  [facility]   Place of Service: SNF (31)  PCP: Terald Sleeper, MD  No Known Allergies  Chief Complaint  Patient presents with  . Medical Managment of Chronic Issues    HPI:  63 year old male patient seen for routine visit and medical management of multiple co-morbidities including T2DM, Obesity,HTN, and CHF, hyperthyroidism, BPH. He is being followed by endocrinology for Hyperthyroidism.  Reports he has been doing well, looking for assisted living facility to move into, nursing without concerns.   Review of Systems:  Review of Systems  Constitutional: Negative for fever, chills and malaise/fatigue.  HENT: Negative for congestion, ear pain, hearing loss, sore throat and tinnitus.   Respiratory: Negative for cough, sputum production and shortness of breath.   Cardiovascular: Negative for chest pain and leg swelling.  Gastrointestinal: Negative for heartburn and abdominal pain.  Genitourinary: Positive for frequency (unchanged; history of BPH and on diuretics). Negative for dysuria and urgency.  Musculoskeletal: Negative for joint pain and myalgias.  Skin: Negative.   Neurological: Negative for dizziness, tingling and headaches.  Psychiatric/Behavioral: Negative for depression. The patient does not have insomnia.      Past Medical History  Diagnosis Date  . 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 He had markedly elevated LV filling pressures and pulmonary venous hypertension with normal pulmonary vascular resistance suggesting he will normalize his pulmonary pressures with adequate diuresis.   . Renal insufficiency   . Sleep apnea   . Morbid obesity   . A-fib   . Chronic diastolic heart failure     Echo 8.25.2011:  Mod LVH; EF 50%; Mod LAE; RV  dilation and ? dysfxn; mild RAE;   . Other primary cardiomyopathies   . Dyslipidemia   . Essential hypertension, benign   . Diabetes mellitus without mention of complication   . Atrial fibrillation 08/26/2009    Annotation: Permanent Not a coumadin candidate secondary to noncompliance Qualifier: Diagnosis of  By: Huntley Dec, Scott    . Chronic systolic heart failure 08/26/2009      Qualifier: Diagnosis of  By: Huntley Dec, Scott     . CORONARY ARTERY DISEASE, S/P PTCA 10/08/2009    Qualifier: Diagnosis of  By: Trevor Iha, RN, Heather    . Acute and chronic respiratory failure (acute-on-chronic) 02/07/2013  . HYPERTHYROIDISM 02/23/2011    Qualifier: Diagnosis of  By: Daphine Deutscher FNP, Zena Amos    . BPH (benign prostatic hyperplasia) 07/23/2013  . Depression 03/13/2013  . Chronic pain 03/13/2013  . Hyperkalemia 02/07/2013  . SLEEP APNEA 08/26/2009    Annotation: diagnosed by overnight oximetry in hospital in Aug. 2010 does not want to wear CPAP Qualifier: Diagnosis of  By: Brynda Rim     Past Surgical History  Procedure Laterality Date  . None     Social History:   reports that he has never smoked. He does not have any smokeless tobacco history on file. He reports that he does not drink alcohol or use illicit drugs.  Family History  Problem Relation Age of Onset  . Hypertension      Aunts    Medications: Patient's Medications  New Prescriptions   No medications on file  Previous Medications   CHOLECALCIFEROL 1000 UNITS TABLET    Take 1,000 Units by mouth daily.  CLONIDINE (CATAPRES) 0.1 MG TABLET    Take 0.2 mg by mouth 2 (two) times daily.    CYANOCOBALAMIN 1000 MCG TABLET    Take 1,000 mcg by mouth daily.    DABIGATRAN (PRADAXA) 150 MG CAPS    Take 1 capsule (150 mg total) by mouth every 12 (twelve) hours.   DIGOXIN (LANOXIN) 0.125 MG TABLET    Take 0.375 mg by mouth daily.   FERROUS SULFATE 325 (65 FE) MG EC TABLET    Take 325 mg by mouth daily.    FINASTERIDE (PROSCAR) 5 MG TABLET     Take 5 mg by mouth daily.   FUROSEMIDE (LASIX) 40 MG TABLET    Take 2 tablets (80 mg total) by mouth 2 (two) times daily.   GABAPENTIN (NEURONTIN) 300 MG CAPSULE    Take 300 mg by mouth 3 (three) times daily.   INSULIN GLARGINE (LANTUS) 100 UNIT/ML INJECTION    Inject 25 Units into the skin at bedtime.    LIDOCAINE (LIDODERM) 5 %    Place 1 patch onto the skin daily. 1/2 patch to both shins daily   LISINOPRIL (PRINIVIL,ZESTRIL) 40 MG TABLET    Take 1 tablet (40 mg total) by mouth daily.   MAGNESIUM OXIDE (MAG-OX) 400 MG TABLET    Take 400 mg by mouth 2 (two) times daily.   METFORMIN (GLUCOPHAGE) 500 MG TABLET    Take 1,000 mg by mouth 2 (two) times daily with a meal.   POLYETHYLENE GLYCOL (MIRALAX / GLYCOLAX) PACKET    Take 17 g by mouth at bedtime.    POTASSIUM CHLORIDE SA (K-DUR,KLOR-CON) 20 MEQ TABLET    Take 40 mEq by mouth 2 (two) times daily.    RANITIDINE (ZANTAC) 150 MG TABLET    Take 150 mg by mouth at bedtime.   SODIUM CHLORIDE (OCEAN) 0.65 % NASAL SPRAY    Place 1 spray into the nose 2 (two) times daily.   SPIRONOLACTONE (ALDACTONE) 25 MG TABLET    Take 1 tablet (25 mg total) by mouth daily.   TAMSULOSIN (FLOMAX) 0.4 MG CAPS CAPSULE    Take 0.4 mg by mouth daily.   THIAMINE HCL (THIAMINE PO)    Take 100 mg by mouth daily.   Modified Medications   No medications on file  Discontinued Medications   METHIMAZOLE (TAPAZOLE) 10 MG TABLET    Take 0.5 tablets (5 mg total) by mouth daily.     Physical Exam: Physical Exam  Constitutional: He is oriented to person, place, and time. No distress.  HENT:  Head: Normocephalic and atraumatic.  Eyes: Pupils are equal, round, and reactive to light.  Neck: Normal range of motion. Neck supple. No tracheal deviation present. No thyromegaly present.  Cardiovascular: Normal rate and intact distal pulses.  Exam reveals no gallop and no friction rub.   No murmur heard. Pulmonary/Chest: Effort normal and breath sounds normal. No respiratory distress.   Abdominal: Soft. Bowel sounds are normal. He exhibits no distension. There is no tenderness.  Musculoskeletal: Normal range of motion. He exhibits no tenderness.  Neurological: He is alert and oriented to person, place, and time. Coordination normal.  Skin: Skin is warm and dry. No rash noted. He is not diaphoretic. No pallor.  Psychiatric: Affect normal.    Filed Vitals:   04/01/14 1457  BP: 129/77  Pulse: 90  Temp: 98 F (36.7 C)  Resp: 20      Labs reviewed: Digoxin  Result: 01/24/2014 8:32 AM ( Status: F )  C  Digoxin 1.7 0.8-2.0 ng/mL SLN  -- END OF REPORT --  CBC NO Diff (Complete Blood Count)  Result: 01/04/2014 9:58 AM ( Status: F ) C  WBC 6.9 4.0-10.5 K/uL SLN  RBC 4.49 4.22-5.81 MIL/uL SLN  Hemoglobin 12.1 L 13.0-17.0 g/dL SLN  Hematocrit 16.1 L 39.0-52.0 % SLN  MCV 81.3 78.0-100.0 fL SLN  MCH 26.9 26.0-34.0 pg SLN  MCHC 33.2 30.0-36.0 g/dL SLN  RDW 09.6 04.5-40.9 % SLN  Platelet Count 226 150-400 K/uL SLN  Basic Metabolic Panel  Result: 01/04/2014 10:47 AM ( Status: F )  Sodium 135 135-145 mEq/L SLN  Potassium 4.6 3.5-5.3 mEq/L SLN  Chloride 102 96-112 mEq/L SLN  CO2 24 19-32 mEq/L SLN  Glucose 131 H 70-99 mg/dL SLN  BUN 31 H 8-11 mg/dL SLN  Creatinine 9.14 7.82-9.56 mg/dL SLN  Calcium 9.2 2.1-30.8 mg/dL SLN  Lipid Profile  Result: 01/04/2014 10:47 AM ( Status: F )  Cholesterol 149 0-200 mg/dL SLN C  Triglyceride 657 H <150 mg/dL SLN  HDL Cholesterol 20 L >39 mg/dL SLN  Total Chol/HDL Ratio 7.5 Ratio SLN  VLDL Cholesterol (Calc) 37 8-46 mg/dL SLN  LDL Cholesterol (Calc) 92 9-62 mg/dL SLN C  TSH, Ultrasensitive  Result: 01/04/2014 11:17 AM ( Status: F )  TSH <0.008 L 0.350-4.500 uIU/mL SLN  T4, Free  Result: 01/04/2014 11:17 AM ( Status: F )  Free T4 1.47 0.80-1.80 ng/dL SLN  Hemoglobin X5M  Result: 01/04/2014 3:54 PM ( Status: F )  Hemoglobin A1C 6.5 H <5.7 % SLN C  Estimated Average Glucose 140 H <117 mg/dL SLN  T3, Free  Result: 01/04/2014 11:17 AM  ( Status: F )  T3, Free 4.2    Assessment/Plan 1. Atrial fibrillation -stable without rapid HR; conts on dig and pradaxa   2. Chronic systolic heart failure -without symptoms of worsening heart failure, conts on diuretics and antihypertensives   3. Essential hypertension, benign - stable at this time, conts catapres and lisinopril  4. Constipation -symptoms well controlled at present   5. GERD (gastroesophageal reflux disease) -stable without symptoms on Zantac  6. Type II or unspecified type diabetes mellitus with neurological manifestations, not stated as uncontrolled(250.60) Blood sugars are well controlled at this time, will cont to monitor   7. BPH (benign prostatic hyperplasia) -stable on flomax and proscar  8. HYPERTHYROIDISM  Followed by endocrinology for management- has follow up for post iodine tx

## 2014-04-22 ENCOUNTER — Encounter: Payer: Self-pay | Admitting: Endocrinology

## 2014-04-22 ENCOUNTER — Non-Acute Institutional Stay (SKILLED_NURSING_FACILITY): Payer: Medicaid Other | Admitting: Nurse Practitioner

## 2014-04-22 ENCOUNTER — Ambulatory Visit (INDEPENDENT_AMBULATORY_CARE_PROVIDER_SITE_OTHER): Payer: Medicaid Other | Admitting: Endocrinology

## 2014-04-22 VITALS — BP 100/60 | HR 56 | Temp 98.7°F | Wt 312.0 lb

## 2014-04-22 DIAGNOSIS — I5022 Chronic systolic (congestive) heart failure: Secondary | ICD-10-CM

## 2014-04-22 DIAGNOSIS — N400 Enlarged prostate without lower urinary tract symptoms: Secondary | ICD-10-CM

## 2014-04-22 DIAGNOSIS — E0510 Thyrotoxicosis with toxic single thyroid nodule without thyrotoxic crisis or storm: Secondary | ICD-10-CM

## 2014-04-22 DIAGNOSIS — E0590 Thyrotoxicosis, unspecified without thyrotoxic crisis or storm: Secondary | ICD-10-CM

## 2014-04-22 DIAGNOSIS — K5900 Constipation, unspecified: Secondary | ICD-10-CM

## 2014-04-22 DIAGNOSIS — I4891 Unspecified atrial fibrillation: Secondary | ICD-10-CM

## 2014-04-22 DIAGNOSIS — E1149 Type 2 diabetes mellitus with other diabetic neurological complication: Secondary | ICD-10-CM

## 2014-04-22 DIAGNOSIS — D649 Anemia, unspecified: Secondary | ICD-10-CM

## 2014-04-22 DIAGNOSIS — K219 Gastro-esophageal reflux disease without esophagitis: Secondary | ICD-10-CM

## 2014-04-22 DIAGNOSIS — I10 Essential (primary) hypertension: Secondary | ICD-10-CM

## 2014-04-22 LAB — T3, FREE: T3 FREE: 4.6 pg/mL — AB (ref 2.3–4.2)

## 2014-04-22 LAB — TSH: TSH: 0.03 u[IU]/mL — AB (ref 0.35–5.50)

## 2014-04-22 LAB — T4, FREE: Free T4: 1.92 ng/dL — ABNORMAL HIGH (ref 0.60–1.60)

## 2014-04-22 MED ORDER — GABAPENTIN 300 MG PO CAPS
300.0000 mg | ORAL_CAPSULE | Freq: Two times a day (BID) | ORAL | Status: DC
Start: 2014-04-22 — End: 2014-08-02

## 2014-04-22 NOTE — Progress Notes (Signed)
Patient ID: [name] Notarianni, male   DOB: 12/23/1950, 63 y.o.   MRN: [id number]                                                                [name] Wahlquist  Reason for Appointment:  Hyperthyroidism, new consultation    History of Present Illness:    Hyperthyroidism  diagnosis was made probably in early 2014. No details are available since apparently he was transferred to his current nursing home already taking methimazole 10 mg daily in early 2014 Not clear if he had any symptoms at diagnosis and he does not think he had any of the usual symptoms of  palpitations, shakiness, nervousness, feeling excessively warm and sweaty or fatigue. He does have a previous history of atrial fibrillation He had been losing weight gradually over the last 3 years with a baseline weight of about 400 pounds; is still losing weight but not at a faster pace. 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 Had not been continued on methimazole. Again he does not feel any different and has no unusual fatigue or palpitations  Recent labs: Total T4 in 11/14 was 12.6 and TSH <0.008; in 12/14 free T4 was 1.75, normal and free T3 was 4.3, high with low TSH  Lab Results  Component Value Date   FREET4 1.91* 02/04/2014   FREET4 1.30 02/25/2011   FREET4 1.49 09/07/2010   TSH 0.02* 02/04/2014   TSH <0.008 uIU/mL* 02/22/2011   TSH 0.004* 09/03/2010    Problem 2: THYROID nodule He apparently had a thyroid nodule in 2012 measuring about 3.5 cm. Followup ultrasound in 06/2013 showed the following: Focal nodules: Lower pole right-sided  nodule which is mildly hyperechoic. 1.2 x 1.3 x 1.2 cm. Similar to minimally enlarged from 1.0 x 0.9 x 1.0 cm on the prior exam.  An inter/upper pole minimally hypoechoic right-sided nodule measures 9 x 6 x 7 mm. Not readily apparent on the prior exam.  Left-sided inter/lower pole heterogeneous solid mass. 3.5 x 3.0 x 3.0 cm. On the prior exam, this measured 3.5 x 2.3 x 2.3 cm.  No biopsy has been done.    Medication List       This list is accurate as of: 04/22/14  1:13 PM.  Always use your most recent med list.               Cholecalciferol 1000 UNITS tablet  Take 1,000 Units by mouth daily.     cloNIDine 0.1 MG tablet  Commonly known as:  CATAPRES  Take 0.2 mg by mouth 2 (two) times daily.     cyanocobalamin 1000 MCG tablet  Take 1,000 mcg by mouth daily.     dabigatran 150 MG Caps capsule  Commonly known as:  PRADAXA  Take 1 capsule (150 mg total) by mouth every 12 (twelve) hours.  digoxin 0.125 MG tablet  Commonly known as:  LANOXIN  Take 0.375 mg by mouth daily.     ferrous sulfate 325 (65 FE) MG EC tablet  Take 325 mg by mouth daily.     finasteride 5 MG tablet  Commonly known as:  PROSCAR  Take 5 mg by mouth daily.     furosemide 40 MG tablet  Commonly known as:  LASIX  Take 2 tablets (80 mg total) by mouth 2 (two) times daily.     gabapentin 300 MG capsule  Commonly known as:  NEURONTIN  Take 300 mg by mouth 3 (three) times daily.     insulin glargine 100 UNIT/ML injection  Commonly known as:  LANTUS  Inject 25 Units into the skin at bedtime.     lidocaine 5 %  Commonly known as:  LIDODERM  Place 1 patch onto the skin daily. 1/2 patch to both shins daily     lisinopril 40 MG tablet  Commonly known as:  PRINIVIL,ZESTRIL  Take 1 tablet (40 mg total) by mouth daily.     magnesium oxide 400 MG tablet  Commonly known as:  MAG-OX  Take 400 mg by mouth 2 (two) times daily.     metFORMIN 500 MG tablet  Commonly known as:  GLUCOPHAGE  Take 1,000 mg  by mouth 2 (two) times daily with a meal.     polyethylene glycol packet  Commonly known as:  MIRALAX / GLYCOLAX  Take 17 g by mouth at bedtime.     potassium chloride SA 20 MEQ tablet  Commonly known as:  K-DUR,KLOR-CON  Take 40 mEq by mouth 2 (two) times daily.     ranitidine 150 MG tablet  Commonly known as:  ZANTAC  Take 150 mg by mouth at bedtime.     sodium chloride 0.65 % nasal spray  Commonly known as:  OCEAN  Place 1 spray into the nose 2 (two) times daily.     spironolactone 25 MG tablet  Commonly known as:  ALDACTONE  Take 1 tablet (25 mg total) by mouth daily.     tamsulosin 0.4 MG Caps capsule  Commonly known as:  FLOMAX  Take 0.4 mg by mouth daily.     THIAMINE PO  Take 100 mg by mouth daily.            Past Medical History  Diagnosis Date  . 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 He had markedly elevated LV filling pressures and pulmonary venous hypertension with normal pulmonary vascular resistance suggesting he will normalize his pulmonary pressures with adequate diuresis.   . Renal insufficiency   . Sleep apnea   . Morbid obesity   . A-fib   . Chronic diastolic heart failure     Echo 8.25.2011:  Mod LVH; EF 50%; Mod LAE; RV dilation and ? dysfxn; mild RAE;   . Other primary cardiomyopathies   . Dyslipidemia   . Essential hypertension, benign   . Diabetes mellitus without mention of complication   . Atrial fibrillation 08/26/2009    Annotation: Permanent Not a coumadin candidate secondary to noncompliance Qualifier: Diagnosis of  By: Huntley Dec, Scott    . Chronic systolic heart failure 08/26/2009      Qualifier: Diagnosis of  By: Huntley Dec, Scott     . CORONARY ARTERY DISEASE, S/P PTCA 10/08/2009    Qualifier: Diagnosis of  By: Trevor Iha, RN, Heather    .  Acute and chronic respiratory failure (acute-on-chronic) 02/07/2013  . HYPERTHYROIDISM  02/23/2011    Qualifier: Diagnosis of  By: Daphine Deutscher FNP, Zena Amos    . BPH (benign prostatic hyperplasia) 07/23/2013  . Depression 03/13/2013  . Chronic pain 03/13/2013  . Hyperkalemia 02/07/2013  . SLEEP APNEA 08/26/2009    Annotation: diagnosed by overnight oximetry in hospital in Aug. 2010 does not want to wear CPAP Qualifier: Diagnosis of  By: Brynda Rim      Past Surgical History  Procedure Laterality Date  . None      Family History  Problem Relation Age of Onset  . Hypertension      Aunts    Social History:  reports that he has never smoked. He does not have any smokeless tobacco history on file. He reports that he does not drink alcohol or use illicit drugs.  Allergies: No Known Allergies  Review of Systems:  CARDIOLOGY:  he has a  history of high blood pressure.  Also apparently has had CHF but recently asymptomatic. History of atrial fibrillation     ENDOCRINOLOGY:  he has had long-standing diabetes, last A1c was 6.5 in 1/15 He says he has had significant peripheral neuropathy in his legs causing weakness and is using a wheelchair to ambulate.            Examination:   BP 100/60  Pulse 56  Temp(Src) 98.7 F (37.1 C) (Oral)  Wt 312 lb (141.522 kg)  SpO2 96%   General Appearance:  he has marked generalized obesity. He is alert  Eyes: No prominence or eyelid swelling  Neck: The thyroid is not palpable on the right side. Has about a 2-3 cm smooth rounded nodule felt with difficulty on swallowing on the left side Neurological: REFLEXES: In upper extremities appear normal.  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 Free T4 04/22/2014 1.92* 0.60 - 1.60 ng/dL Final  . T3, Free 73/22/0254 4.6* 2.3 - 4.2 pg/mL Final  . TSH 04/22/2014 0.03* 0.35 - 5.50 uIU/mL Final

## 2014-04-22 NOTE — Progress Notes (Signed)
Patient ID: [name], male   DOB: 07/07/1951, 63 y.o.   MRN: [id number]    [facility]   Place of Service: SNF (31)  PCP: Terald SleeperOBSON,MICHAEL GAVIN, MD  No Known Allergies  Chief Complaint  Patient presents with  . Medical Management of Chronic Issues    HPI:  63 year old male patient seen for routine visit and medical management of multiple co-morbidities including T2DM, Obesity,HTN, and CHF, hyperthyroidism, BPH. He is going to endocrinology appt today for ongoing management of Hyperthyroidism. Reports he has been doing well, looking for  facility to move into, would like to be taking less medications. Otherwise no complaints. nursing without concerns.  Request for SCAT application to be completed  Review of Systems:  Review of Systems  Constitutional: Negative for fever, chills and malaise/fatigue.  HENT: Negative for ear pain, hearing loss, sore throat and tinnitus.   Respiratory: Negative for cough, sputum production and shortness of breath.   Cardiovascular: Negative for chest pain and leg swelling.  Gastrointestinal: Negative for heartburn, abdominal pain, diarrhea and constipation.  Genitourinary: Positive for frequency (unchanged; history of BPH and on diuretics). Negative for dysuria and urgency.  Musculoskeletal: Negative for joint pain and myalgias.  Skin: Negative.   Neurological: Negative for dizziness and headaches.  Psychiatric/Behavioral: Negative for depression. The patient does not have insomnia.      Past Medical History  Diagnosis Date  . 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 He had markedly elevated LV filling pressures and pulmonary venous hypertension with normal pulmonary vascular resistance suggesting he will normalize his pulmonary pressures with adequate diuresis.   . Renal insufficiency   . Sleep  apnea   . Morbid obesity   . A-fib   . Chronic diastolic heart failure     Echo 8.25.2011:  Mod LVH; EF 50%; Mod LAE; RV dilation and ? dysfxn; mild RAE;   . Other primary cardiomyopathies   . Dyslipidemia   . Essential hypertension, benign   . Diabetes mellitus without mention of complication   . Atrial fibrillation 08/26/2009    Annotation: Permanent Not a coumadin candidate secondary to noncompliance Qualifier: Diagnosis of  By: Huntley DecWeaver PA-C, Scott    . Chronic systolic heart failure 08/26/2009      Qualifier: Diagnosis of  By: Huntley DecWeaver PA-C, Scott     . CORONARY ARTERY DISEASE, S/P PTCA 10/08/2009    Qualifier: Diagnosis of  By: Trevor IhaSchub, RN, Heather    . Acute and chronic respiratory failure (acute-on-chronic) 02/07/2013  . HYPERTHYROIDISM 02/23/2011    Qualifier: Diagnosis of  By: Daphine DeutscherMartin FNP, Zena AmosNykedtra    . BPH (benign prostatic hyperplasia) 07/23/2013  . Depression 03/13/2013  . Chronic pain 03/13/2013  . Hyperkalemia 02/07/2013  . SLEEP APNEA 08/26/2009    Annotation: diagnosed by overnight oximetry in hospital in Aug. 2010 does not want to wear CPAP Qualifier: Diagnosis of  By: Brynda RimWeaver PA-C, Scott     Past Surgical History  Procedure Laterality Date  . None     Social History:   reports that he has never smoked. He does not have any smokeless tobacco history on file. He reports that he does not drink alcohol or use illicit drugs.  Family History  Problem Relation Age of Onset  . Hypertension      Aunts    Medications: Patient's Medications  New Prescriptions   No medications on  file  Previous Medications   CHOLECALCIFEROL 1000 UNITS TABLET    Take 1,000 Units by mouth daily.   CLONIDINE (CATAPRES) 0.1 MG TABLET    Take 0.2 mg by mouth 2 (two) times daily.    CYANOCOBALAMIN 1000 MCG TABLET    Take 1,000 mcg by mouth daily.    DABIGATRAN (PRADAXA) 150 MG CAPS    Take 1 capsule (150 mg total) by mouth every 12 (twelve) hours.   DIGOXIN (LANOXIN) 0.125 MG TABLET    Take 0.375 mg by  mouth daily.   FERROUS SULFATE 325 (65 FE) MG EC TABLET    Take 325 mg by mouth daily.    FINASTERIDE (PROSCAR) 5 MG TABLET    Take 5 mg by mouth daily.   FUROSEMIDE (LASIX) 40 MG TABLET    Take 2 tablets (80 mg total) by mouth 2 (two) times daily.   GABAPENTIN (NEURONTIN) 300 MG CAPSULE    Take 300 mg by mouth 3 (three) times daily.   INSULIN GLARGINE (LANTUS) 100 UNIT/ML INJECTION    Inject 25 Units into the skin at bedtime.    LIDOCAINE (LIDODERM) 5 %    Place 1 patch onto the skin daily. 1/2 patch to both shins daily   LISINOPRIL (PRINIVIL,ZESTRIL) 40 MG TABLET    Take 1 tablet (40 mg total) by mouth daily.   MAGNESIUM OXIDE (MAG-OX) 400 MG TABLET    Take 400 mg by mouth 2 (two) times daily.   METFORMIN (GLUCOPHAGE) 500 MG TABLET    Take 1,000 mg by mouth 2 (two) times daily with a meal.   POLYETHYLENE GLYCOL (MIRALAX / GLYCOLAX) PACKET    Take 17 g by mouth at bedtime.    POTASSIUM CHLORIDE SA (K-DUR,KLOR-CON) 20 MEQ TABLET    Take 40 mEq by mouth 2 (two) times daily.    RANITIDINE (ZANTAC) 150 MG TABLET    Take 150 mg by mouth at bedtime.   SODIUM CHLORIDE (OCEAN) 0.65 % NASAL SPRAY    Place 1 spray into the nose 2 (two) times daily.   SPIRONOLACTONE (ALDACTONE) 25 MG TABLET    Take 1 tablet (25 mg total) by mouth daily.   TAMSULOSIN (FLOMAX) 0.4 MG CAPS CAPSULE    Take 0.4 mg by mouth daily.   THIAMINE HCL (THIAMINE PO)    Take 100 mg by mouth daily.   Modified Medications   No medications on file  Discontinued Medications   No medications on file     Physical Exam: Physical Exam  Constitutional: He is oriented to person, place, and time. No distress.  HENT:  Head: Normocephalic and atraumatic.  Eyes: Conjunctivae and EOM are normal. Pupils are equal, round, and reactive to light.  Neck: Normal range of motion. Neck supple.  Cardiovascular: Normal rate, regular rhythm and normal heart sounds.   Pulmonary/Chest: Effort normal and breath sounds normal. No respiratory distress. He  has no wheezes.  Abdominal: Soft. Bowel sounds are normal. Distention: obese. There is no tenderness.  Musculoskeletal: Normal range of motion. He exhibits no tenderness.  Neurological: He is alert and oriented to person, place, and time. Gait normal. Coordination normal. GCS score is 15.  Skin: Skin is warm and dry. He is not diaphoretic. Nails show no clubbing.  Psychiatric: Mood, memory, affect and judgment normal.    Filed Vitals:   04/22/14 1403  BP: 137/81  Pulse: 84  Temp: 97.2 F (36.2 C)  Resp: 18      Labs reviewed: Digoxin  Result: 01/24/2014 8:32 AM ( Status: F ) C  Digoxin 1.7 0.8-2.0 ng/mL SLN  -- END OF REPORT --  CBC NO Diff (Complete Blood Count)  Result: 01/04/2014 9:58 AM ( Status: F ) C  WBC 6.9 4.0-10.5 K/uL SLN  RBC 4.49 4.22-5.81 MIL/uL SLN  Hemoglobin 12.1 L 13.0-17.0 g/dL SLN  Hematocrit 12.2 L 39.0-52.0 % SLN  MCV 81.3 78.0-100.0 fL SLN  MCH 26.9 26.0-34.0 pg SLN  MCHC 33.2 30.0-36.0 g/dL SLN  RDW 44.9 75.3-00.5 % SLN  Platelet Count 226 150-400 K/uL SLN  Basic Metabolic Panel  Result: 01/04/2014 10:47 AM ( Status: F )  Sodium 135 135-145 mEq/L SLN  Potassium 4.6 3.5-5.3 mEq/L SLN  Chloride 102 96-112 mEq/L SLN  CO2 24 19-32 mEq/L SLN  Glucose 131 H 70-99 mg/dL SLN  BUN 31 H 1-10 mg/dL SLN  Creatinine 2.11 1.73-5.67 mg/dL SLN  Calcium 9.2 0.1-41.0 mg/dL SLN  Lipid Profile  Result: 01/04/2014 10:47 AM ( Status: F )  Cholesterol 149 0-200 mg/dL SLN C  Triglyceride 301 H <150 mg/dL SLN  HDL Cholesterol 20 L >39 mg/dL SLN  Total Chol/HDL Ratio 7.5 Ratio SLN  VLDL Cholesterol (Calc) 37 3-14 mg/dL SLN  LDL Cholesterol (Calc) 92 3-88 mg/dL SLN C  TSH, Ultrasensitive  Result: 01/04/2014 11:17 AM ( Status: F )  TSH <0.008 L 0.350-4.500 uIU/mL SLN  T4, Free  Result: 01/04/2014 11:17 AM ( Status: F )  Free T4 1.47 0.80-1.80 ng/dL SLN  Hemoglobin I7N  Result: 01/04/2014 3:54 PM ( Status: F )  Hemoglobin A1C 6.5 H <5.7 % SLN C  Estimated Average  Glucose 140 H <117 mg/dL SLN  T3, Free  Result: 01/04/2014 11:17 AM ( Status: F )  T3, Free 4.2     T4, Total    Result: 04/10/2014 12:10 PM   ( Status: F )     C T4 14.9   H 5.0-12.5 ug/dL SLN   TSH, Ultrasensitive    Result: 04/10/2014 12:10 PM   ( Status: F )       TSH <0.008   L 0.350-4.500 uIU/mL SLN   T3, Total    Result: 04/10/2014 12:10 PM   ( Status: F )       T3 160.0     80.0-204.0 ng/dL  Assessment/Plan 1. Chronic systolic heart failure -stable on diuretics and antihypertensives   2. Atrial fibrillation -rate controlled on dig and using pradaxa for anticoagulation   3. Essential hypertension, benign -blood pressure in good control  4. Constipation -stable  5. GERD (gastroesophageal reflux disease) -does not have symptoms will stop medication and cont to evaluate   6. Type II or unspecified type diabetes mellitus with neurological manifestations, not stated as uncontrolled(250.60) A1c in jan was 6.5, blood sugars reviewed and remain in appropriate range, will cont current medications   7. BPH (benign prostatic hyperplasia) -stable on flomax and proscar  8. Anemia -pt on many supplements, hgb has remained stable. Will stop supplements to decrease pill burden

## 2014-05-21 ENCOUNTER — Non-Acute Institutional Stay (SKILLED_NURSING_FACILITY): Payer: Medicaid Other | Admitting: Internal Medicine

## 2014-05-21 DIAGNOSIS — I509 Heart failure, unspecified: Secondary | ICD-10-CM

## 2014-05-21 DIAGNOSIS — E0590 Thyrotoxicosis, unspecified without thyrotoxic crisis or storm: Secondary | ICD-10-CM

## 2014-05-21 DIAGNOSIS — E1149 Type 2 diabetes mellitus with other diabetic neurological complication: Secondary | ICD-10-CM

## 2014-05-21 DIAGNOSIS — I4891 Unspecified atrial fibrillation: Secondary | ICD-10-CM

## 2014-05-21 DIAGNOSIS — I50810 Right heart failure, unspecified: Secondary | ICD-10-CM

## 2014-05-21 NOTE — Progress Notes (Signed)
Patient ID: [name], male   DOB: 08/19/1951, 63 y.o.   MRN: [id number] Facility; [facility] Chief complaint; preoperative review for general anesthesia for multiple teeth extractions History; I been asked to review Mr. [name] who apparently has several worn a necrotic teeth which need general anesthesiologist and hospitalization. The patient has multiple medical issues which makes selective general anesthesia somewhat problematic. These include; 1) uncontrolled hyperthyroidism. He came into the building last year from another facility on methimazole. At that time his thyroid hormone levels were normal. This was rechecked I think the last month at which time his TSH is suppressed the. His T4 and is elevated. He has been to see Dr. Lucianne MussKumar of endocrinology. Will need to review the notes here however I would like all of this to be under better control before considering general anesthesia. 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 He has received I-131 as well as methimazole 10 mg daily for 3 weeks. 2) he was hospitalized shortly after his arrival in the building for right heart failure with severe pulmonary hypertension. At that point I think he had a CPAP although his compliance with this was less than optimal. He tells me he has not had a sleep study his echocardiogram is listed below. His PA systolic pressure was listed in the 80s during this study. The patient states that he has had sleep studies arranged in the past but for one reason or another they never seemed to go through I don't have any tangible memory of this. I will try to rearrange this. He probably also needs an arterial blood gas to see what his underlying PCO2 is although he does not have an elevated total CO2 on his metabolic panel from January 3) Chronic Atrial fibrillation. On pradaxa, digoxin 0.375 daily, Lasix 80 mg twice a day, Aldactone 25 mg daily K. Dur 40 mEq twice a day, lisinopril 40 mg daily #4  hypertension in addition to the above he is also on Catapres 0.2 twice a day #5 type 2 diabetes; on Lantus insulin 25 units as well as metformin 1000 twice a day    ATTENDING    Zubelevitskiy, Konstantin cc:  ------------------------------------------------------------ LV EF: 50%  ------------------------------------------------------------ Indications:      CHF - 428.0.  ------------------------------------------------------------ History:   PMH:  No prior cardiac history.  Atrial fibrillation.  Coronary artery disease.  Angina pectoris. Risk factors:  Emphysema. Sleep apnea. Cardiomyopathy. Bradycardia. Obesity. Diabetes mellitus. Dyslipidemia.  ------------------------------------------------------------ Study Conclusions  - Left ventricle: The cavity size was normal. Wall thickness   was increased in a pattern of mild LVH. The estimated   ejection fraction was 50%. Mild global hypokinesis.   Indeterminant diastolic function. - Ventricular septum: D-shaped interventricular septum   suggestive of RV pressure and volume overload. - Aortic valve: There was no stenosis. - Mitral valve: Trivial regurgitation. - Left atrium: The atrium was mildly dilated. - Right ventricle: The cavity size was severely dilated.   Systolic function was moderately reduced. - Right atrium: The atrium was moderately dilated. - Tricuspid valve: Peak RV-RA gradient: 71mm Hg (S). - Pulmonary arteries: PA systolic pressure 82-86 mmHg. - Systemic veins: IVC measured 2.4 cm with < 50%   respirophasic variation, suggesting RA pressure 11-15   mmHg. - Pericardium, extracardiac: A trivial pericardial effusion   was identified posterior to the heart. Impressions:  - Normal LV size with mild global hypokinesis, EF 50%. Mild   LVH. D-shaped interventricular septum suggestive of RV  pressure/volume overload. Moderate RAE, mild LAE. Severely   dilated RV with moderately decreased systolic function.    Severe pulmonary hypertension. Transthoracic echocardiography.  M-mode, complete 2D, spectral Doppler, and color Doppler.  Height:  Height: 160cm. Height: 63in.  Weight:  Weight: 177.8kg. Weight: 391.2lb.  Body mass index:  BMI: 69.4kg/m^2.  Body surface area:    BSA: 2.77m^2.  Patient status:  Inpatient. Location:  ICU/CCU  ------------------------------------------------------------  ------------------------------------------------------------ Left ventricle:  The cavity size was normal. Wall thickness was increased in a pattern of mild LVH. The estimated ejection fraction was 50%. Mild global hypokinesis. Indeterminant diastolic function.  ------------------------------------------------------------ Aortic valve:   Mildly calcified leaflets.  Doppler:   There was no stenosis.    No regurgitation.  ------------------------------------------------------------ Aorta:  Aortic root: The aortic root was normal in size. Ascending aorta: The ascending aorta was normal in size.  ------------------------------------------------------------ Mitral valve:   Doppler:   There was no evidence for stenosis.    Trivial regurgitation.  ------------------------------------------------------------ Left atrium:  The atrium was mildly dilated.  ------------------------------------------------------------ Right ventricle:  The cavity size was severely dilated. Systolic function was moderately reduced.  ------------------------------------------------------------ Ventricular septum:   D-shaped interventricular septum suggestive of RV pressure and volume overload.  ------------------------------------------------------------ Pulmonic valve:    Structurally normal valve.   Cusp separation was normal.  Doppler:  Transvalvular velocity was within the normal range.  Trivial regurgitation.  ------------------------------------------------------------ Tricuspid valve:   Doppler:   Trivial  regurgitation.  ------------------------------------------------------------ Pulmonary artery:   PA systolic pressure 82-86 mmHg.  ------------------------------------------------------------  Right atrium:  The atrium was moderately dilated.  ------------------------------------------------------------ Pericardium:  A trivial pericardial effusion was identified posterior to the heart.  ------------------------------------------------------------ Systemic veins:  IVC measured 2.4 cm with < 50% respirophasic variation, suggesting RA pressure 11-15 mmHg.   Results for GILES, SHAYNE (MRN [id number]) as of 05/21/2014 09:58  Ref. Range 02/15/2013 09:03 02/16/2013 05:37 06/25/2013 00:00 02/04/2014 14:55 04/22/2014 13:34  Sodium Latest Range: 135-145 mEq/L 137      Potassium Latest Range: 3.5-5.1 mEq/L 3.5      Chloride Latest Range: 96-112 mEq/L 87 (L)      CO2 Latest Range: 19-32 mEq/L 44 (HH)      BUN Latest Range: 6-23 mg/dL 26 (H)      Creatinine Latest Range: 0.50-1.35 mg/dL 3.23 (H)      Calcium Latest Range: 8.4-10.5 mg/dL 9.0      GFR calc non Af Amer Latest Range: >90 mL/min 54 (L)      GFR calc Af Amer Latest Range: >90 mL/min 62 (L)      Glucose Latest Range: 70-99 mg/dL 557 (H)      Pro B Natriuretic peptide (BNP) Latest Range: 0-125 pg/mL 5669.0 (H)      TSH Latest Range: 0.35-5.50 uIU/mL    0.02 (L) 0.03 (L)  Free T4 Latest Range: 0.60-1.60 ng/dL    3.22 (H) 0.25 (H)  T3, Free Latest Range: 2.3-4.2 pg/mL    5.0 (H) 4.6 (H)  LAB REPORT - SCANNED No range found  Attch Attch       Review of systems Gen. he has lost weight although I'm not able to quantitate this at the moment Respiratory; he does not complain of cough or shortness of breath. He does not know if he has snoring or other phenomenon and night. He does not admit to daytime hypersomnolence Cardiac he has no history of chest pain GI no abdominal pain Extremities no claudication  Physical examination; O2 sat is  95% on room air pulse rate 65 and irregular respirations 18 and unlabored Gen. pleasant cooperative alert man clearly has morbid obesity Neck no overt goiter Respiratory; surprisingly clear entry bilaterally Cardiac heart sounds are normal there is no murmurs no increase in jugular venous pressure he has some lower extremity edema probably some degree of venous stasis Abdomen obese nontender  Impressions/plan #1] multiple issues of concern for general anesthesia which include uncontrolled hyperthyroidism, at least as of early 2014 severe right-sided heart dysfunction on his echocardiogram with severe pulmonary hypertension, atrial fibrillation with a chads vascular score of at least 3 #2] uncontrolled hyperthyroidism currently being actively treated by Dr. Lucianne Muss of endocrinology #3] severe right heart failure with pulmonary hypertension. I had suspected that this was likely due to obesity hypoventilation syndrome. The patient reports that he has had previous sleep studies arranged but that these were never carried through. I don't really know what the problem was here although Ill try to rearrange this. It thought he had had nocturnal CPAP in the past although this according to the patient is not true #4] atrial fibrillation his EKG done yesterday shows poor R-wave progression with right bundle branch block, inferior Q waves and ventricular premature beats. There is also ST abnormalities especially in the septal leads  I think he is man's general medical conditions need to be more stable before he undergoes general anesthesia, especially for an elective procedure. Going to try to arrange a sleep study for him. I will send him back to see cardiology. All of this will give Dr. Lucianne Muss more time to adequately manage his hyperthyroidism, although he is not obviously symptomatic I would like his biochemical parameters to be within the normal range.

## 2014-05-28 ENCOUNTER — Ambulatory Visit (HOSPITAL_COMMUNITY)
Admission: RE | Admit: 2014-05-28 | Discharge: 2014-05-28 | Disposition: A | Discharge status: Home / Self Care | Payer: Medicaid Other | Source: Ambulatory Visit | Attending: Internal Medicine | Admitting: Internal Medicine

## 2014-05-28 DIAGNOSIS — J438 Other emphysema: Secondary | ICD-10-CM | POA: Insufficient documentation

## 2014-05-28 LAB — BLOOD GAS, ARTERIAL
Acid-base deficit: 1.9 mmol/L (ref 0.0–2.0)
Bicarbonate: 22.1 mEq/L (ref 20.0–24.0)
DRAWN BY: 12206
FIO2: 0.21 %
O2 Saturation: 95 %
PCO2 ART: 35.8 mmHg (ref 35.0–45.0)
Patient temperature: 98.6
TCO2: 23.2 mmol/L (ref 0–100)
pH, Arterial: 7.407 (ref 7.350–7.450)
pO2, Arterial: 75 mmHg — ABNORMAL LOW (ref 80.0–100.0)

## 2014-06-07 IMAGING — CR DG CHEST 1V PORT
1 series · 1 of 1 positions shown · non-contrast
Comparison: Prior chest x-ray 12/22/2011

CLINICAL DATA: CHF

PORTABLE CHEST - 1 VIEW

[AP]
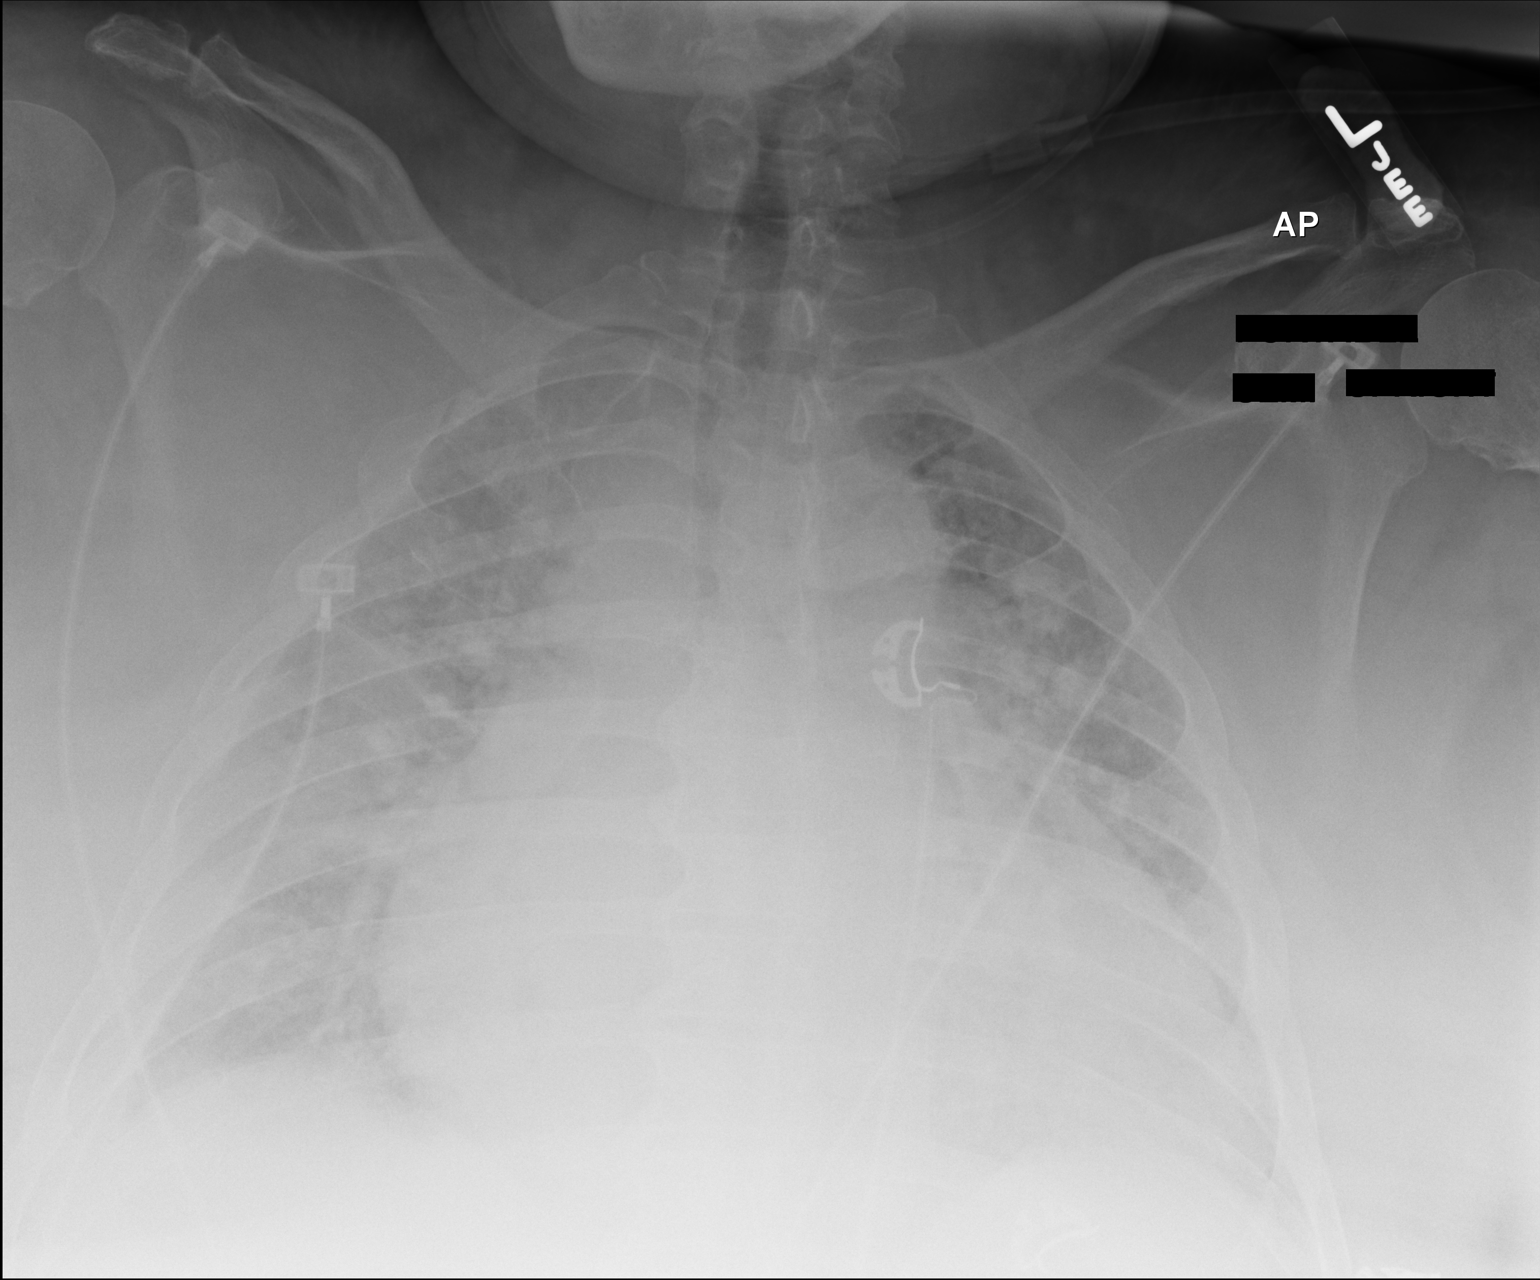

[1 of 1 positions shown; findings below may reference images not displayed]

FINDINGS: Increased pulmonary vascular congestion now with mild
interstitial edema.  Marked enlargement of the cardiopericardial
silhouette similar to prior.  Enlarged at venous vascular stripe
along the right the tracheal contour.  Small right pleural
effusion.  No acute osseous abnormality.
IMPRESSION: 1.  Pulmonary edema and small right effusion consistent with CHF.
2.  Stable cardiomegaly.

## 2014-06-17 ENCOUNTER — Ambulatory Visit: Payer: Medicaid Other | Admitting: Physician Assistant

## 2014-06-17 ENCOUNTER — Ambulatory Visit: Payer: Medicaid Other | Admitting: Endocrinology

## 2014-06-24 ENCOUNTER — Ambulatory Visit: Payer: Medicaid Other | Admitting: Endocrinology

## 2014-06-24 ENCOUNTER — Non-Acute Institutional Stay (SKILLED_NURSING_FACILITY): Payer: Medicaid Other | Admitting: Nurse Practitioner

## 2014-06-24 DIAGNOSIS — I10 Essential (primary) hypertension: Secondary | ICD-10-CM

## 2014-06-24 DIAGNOSIS — I482 Chronic atrial fibrillation, unspecified: Secondary | ICD-10-CM

## 2014-06-24 DIAGNOSIS — I4891 Unspecified atrial fibrillation: Secondary | ICD-10-CM

## 2014-06-24 DIAGNOSIS — G4730 Sleep apnea, unspecified: Secondary | ICD-10-CM

## 2014-06-24 DIAGNOSIS — E0590 Thyrotoxicosis, unspecified without thyrotoxic crisis or storm: Secondary | ICD-10-CM

## 2014-06-24 DIAGNOSIS — E1149 Type 2 diabetes mellitus with other diabetic neurological complication: Secondary | ICD-10-CM

## 2014-06-24 DIAGNOSIS — I5022 Chronic systolic (congestive) heart failure: Secondary | ICD-10-CM

## 2014-06-24 NOTE — Progress Notes (Signed)
Patient ID: [name], male   DOB: 09-May-1951, 63 y.o.   MRN: [id number]    Nursing Home Location:  [facility]   Place of Service: SNF (31)  PCP: Terald Sleeper, MD  No Known Allergies  Chief Complaint  Patient presents with  . Medical Management of Chronic Issues    HPI:  63 year old male patient with a pmh of  T2DM, Obesity,HTN, and CHF, hyperthyroidism, BPH seen for routine visit and medical management of multiple co-morbidities; was seen last month by Dr Leanord Hawking for surgeries clearance for multiple teeth extractions under general anesthesia however due to these conditions not being well managed pt is following up with cardiology, endocrine and having sleep study for further evaluation. Pt also currently looking for facility to move into and getting back into the community. Pt has no complaints today and nursing without concerns.   Review of Systems:  Review of Systems  Constitutional: Negative for fever, chills and malaise/fatigue.  HENT: Negative for ear pain, hearing loss, sore throat and tinnitus.  Respiratory: Negative for cough, sputum production and shortness of breath.  Cardiovascular: Negative for chest pain and has min swelling to bilateral LE.  Gastrointestinal: Negative for heartburn, abdominal pain, diarrhea and constipation.  Genitourinary: Positive for frequency (chronic and stable). Negative for dysuria and urgency.  Musculoskeletal: Negative for joint pain and myalgias.  Skin: Negative.  Neurological: Negative for dizziness and headaches.  Psychiatric/Behavioral: Negative for depression. The patient does not have insomnia.    Past Medical History  Diagnosis Date  . 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 He had markedly elevated LV filling pressures and pulmonary venous hypertension with normal pulmonary vascular resistance  suggesting he will normalize his pulmonary pressures with adequate diuresis.   . Renal insufficiency   . Sleep apnea   . Morbid obesity   . A-fib   . Chronic diastolic heart failure     Echo 8.25.2011:  Mod LVH; EF 50%; Mod LAE; RV dilation and ? dysfxn; mild RAE;   . Other primary cardiomyopathies   . Dyslipidemia   . Essential hypertension, benign   . Diabetes mellitus without mention of complication   . Atrial fibrillation 08/26/2009    Annotation: Permanent Not a coumadin candidate secondary to noncompliance Qualifier: Diagnosis of  By: Huntley Dec, Scott    . Chronic systolic heart failure 08/26/2009      Qualifier: Diagnosis of  By: Huntley Dec, Scott     . CORONARY ARTERY DISEASE, S/P PTCA 10/08/2009    Qualifier: Diagnosis of  By: Trevor Iha, RN, Heather    . Acute and chronic respiratory failure (acute-on-chronic) 02/07/2013  . HYPERTHYROIDISM 02/23/2011    Qualifier: Diagnosis of  By: Daphine Deutscher FNP, Zena Amos    . BPH (benign prostatic hyperplasia) 07/23/2013  . Depression 03/13/2013  . Chronic pain 03/13/2013  . Hyperkalemia 02/07/2013  . SLEEP APNEA 08/26/2009    Annotation: diagnosed by overnight oximetry in hospital in Aug. 2010 does not want to wear CPAP Qualifier: Diagnosis of  By: Brynda Rim     Past Surgical History  Procedure Laterality Date  . None     Social History:   reports that he has never smoked. He does not have any smokeless tobacco history on file. He reports that he does not drink alcohol or use illicit drugs.  Family History  Problem Relation Age of Onset  . Hypertension  Aunts    Medications: Patient's Medications  New Prescriptions   No medications on file  Previous Medications   CHOLECALCIFEROL 1000 UNITS TABLET    Take 1,000 Units by mouth daily.   CLONIDINE (CATAPRES) 0.1 MG TABLET    Take 0.2 mg by mouth 2 (two) times daily.    DABIGATRAN (PRADAXA) 150 MG CAPS    Take 1 capsule (150 mg total) by mouth every 12 (twelve) hours.   DIGOXIN  (LANOXIN) 0.125 MG TABLET    Take 0.375 mg by mouth daily.   FINASTERIDE (PROSCAR) 5 MG TABLET    Take 5 mg by mouth daily.   GABAPENTIN (NEURONTIN) 300 MG CAPSULE    Take 1 capsule (300 mg total) by mouth 2 (two) times daily.   INSULIN GLARGINE (LANTUS) 100 UNIT/ML INJECTION    Inject 25 Units into the skin at bedtime.    LIDOCAINE (LIDODERM) 5 %    Place 1 patch onto the skin daily. 1/2 patch to both shins daily   LISINOPRIL (PRINIVIL,ZESTRIL) 40 MG TABLET    Take 1 tablet (40 mg total) by mouth daily.   METFORMIN (GLUCOPHAGE) 500 MG TABLET    Take 1,000 mg by mouth 2 (two) times daily with a meal.   POLYETHYLENE GLYCOL (MIRALAX / GLYCOLAX) PACKET    Take 17 g by mouth at bedtime.    POTASSIUM CHLORIDE SA (K-DUR,KLOR-CON) 20 MEQ TABLET    Take 40 mEq by mouth 2 (two) times daily.    SODIUM CHLORIDE (OCEAN) 0.65 % NASAL SPRAY    Place 1 spray into the nose 2 (two) times daily.   SPIRONOLACTONE (ALDACTONE) 25 MG TABLET    Take 1 tablet (25 mg total) by mouth daily.   TAMSULOSIN (FLOMAX) 0.4 MG CAPS CAPSULE    Take 0.4 mg by mouth daily.  Modified Medications   Modified Medication Previous Medication   FUROSEMIDE (LASIX) 40 MG TABLET furosemide (LASIX) 40 MG tablet      Take 60 mg by mouth 2 (two) times daily.    Take 2 tablets (80 mg total) by mouth 2 (two) times daily.  Discontinued Medications   No medications on file     Physical Exam:  Filed Vitals:   06/24/14 1359  BP: 103/68  Pulse: 62  Temp: 97.9 F (36.6 C)  Resp: 19    Physical Exam  Constitutional: He is oriented to person, place, and time and well-developed, well-nourished, and in no distress.  HENT:  Head: Normocephalic and atraumatic.  Mouth/Throat: Oropharynx is clear and moist. No oropharyngeal exudate.  Eyes: Conjunctivae and EOM are normal. Pupils are equal, round, and reactive to light.  Neck: Normal range of motion. Neck supple.  Cardiovascular: Normal rate, regular rhythm and normal heart sounds.     Pulmonary/Chest: Effort normal and breath sounds normal. No respiratory distress. He has no wheezes.  Abdominal: Soft. Bowel sounds are normal. There is no tenderness.  Musculoskeletal: Normal range of motion. He exhibits no tenderness.  Neurological: He is alert and oriented to person, place, and time. Gait normal. Coordination normal. GCS score is 15.  Skin: Skin is warm and dry. He is not diaphoretic. Nails show no clubbing.  Psychiatric: Mood and affect normal.     Labs reviewed: Digoxin  Result: 01/24/2014 8:32 AM ( Status: F ) C  Digoxin 1.7 0.8-2.0 ng/mL SLN  -- END OF REPORT --  CBC NO Diff (Complete Blood Count)  Result: 01/04/2014 9:58 AM ( Status: F ) C  WBC  6.9 4.0-10.5 K/uL SLN  RBC 4.49 4.22-5.81 MIL/uL SLN  Hemoglobin 12.1 L 13.0-17.0 g/dL SLN  Hematocrit 16.136.5 L 39.0-52.0 % SLN  MCV 81.3 78.0-100.0 fL SLN  MCH 26.9 26.0-34.0 pg SLN  MCHC 33.2 30.0-36.0 g/dL SLN  RDW 09.614.4 04.5-40.911.5-15.5 % SLN  Platelet Count 226 150-400 K/uL SLN  Basic Metabolic Panel  Result: 01/04/2014 10:47 AM ( Status: F )  Sodium 135 135-145 mEq/L SLN  Potassium 4.6 3.5-5.3 mEq/L SLN  Chloride 102 96-112 mEq/L SLN  CO2 24 19-32 mEq/L SLN  Glucose 131 H 70-99 mg/dL SLN  BUN 31 H 8-116-23 mg/dL SLN  Creatinine 9.141.11 7.82-9.560.50-1.35 mg/dL SLN  Calcium 9.2 2.1-30.88.4-10.5 mg/dL SLN  Lipid Profile  Result: 01/04/2014 10:47 AM ( Status: F )  Cholesterol 149 0-200 mg/dL SLN C  Triglyceride 657186 H <150 mg/dL SLN  HDL Cholesterol 20 L >39 mg/dL SLN  Total Chol/HDL Ratio 7.5 Ratio SLN  VLDL Cholesterol (Calc) 37 8-460-40 mg/dL SLN  LDL Cholesterol (Calc) 92 9-620-99 mg/dL SLN C  TSH, Ultrasensitive  Result: 01/04/2014 11:17 AM ( Status: F )  TSH <0.008 L 0.350-4.500 uIU/mL SLN  T4, Free  Result: 01/04/2014 11:17 AM ( Status: F )  Free T4 1.47 0.80-1.80 ng/dL SLN  Hemoglobin X5MA1C  Result: 01/04/2014 3:54 PM ( Status: F )  Hemoglobin A1C 6.5 H <5.7 % SLN C  Estimated Average Glucose 140 H <117 mg/dL SLN  T3, Free  Result:  01/04/2014 11:17 AM ( Status: F )  T3, Free 4.2  T4, Total  Result: 04/10/2014 12:10 PM ( Status: F ) C  T4 14.9 H 5.0-12.5 ug/dL SLN  TSH, Ultrasensitive  Result: 04/10/2014 12:10 PM ( Status: F )  TSH <0.008 L 0.350-4.500 uIU/mL SLN  T3, Total  Result: 04/10/2014 12:10 PM ( Status: F )  T3 160.0 80.0-204.0 ng/dL  CBC NO Diff (Complete Blood Count)    Result: 05/21/2014 12:46 AM   ( Status: F )     C WBC 6.1     4.0-10.5 K/uL SLN   RBC 4.62     4.22-5.81 MIL/uL SLN   Hemoglobin 13.2     13.0-17.0 g/dL SLN   Hematocrit 84.138.3   L 39.0-52.0 % SLN   MCV 82.9     78.0-100.0 fL SLN   MCH 28.6     26.0-34.0 pg SLN   MCHC 34.5     30.0-36.0 g/dL SLN   RDW 32.414.1     40.1-02.711.5-15.5 % SLN   Platelet Count 194     150-400 K/uL SLN   Basic Metabolic Panel    Result: 05/21/2014 12:15 AM   ( Status: F )       Sodium 137     135-145 mEq/L SLN   Potassium 4.8     3.5-5.3 mEq/L SLN   Chloride 102     96-112 mEq/L SLN   CO2 22     19-32 mEq/L SLN   Glucose 147   H 70-99 mg/dL SLN   BUN 37   H 2-536-23 mg/dL SLN   Creatinine 6.641.60   H 0.50-1.35 mg/dL SLN   Calcium 9.2     4.0-34.78.4-10.5 mg/dL SLN    Basic Metabolic Panel    Result: 06/06/2014 2:43 AM   ( Status: F )     C Sodium 137     135-145 mEq/L SLN   Potassium 4.6     3.5-5.3 mEq/L SLN   Chloride 103     96-112 mEq/L  SLN   CO2 22     19-32 mEq/L SLN   Glucose 106   H 70-99 mg/dL SLN   BUN 25   H 0-86 mg/dL SLN   Creatinine 5.78     0.50-1.35 mg/dL SLN   Calcium 8.8     4.6-96.2 mg/dL SLN   Hemoglobin X5M    Result: 06/05/2014 10:25 PM   ( Status: F )       Hemoglobin A1C 7.2   H <5.7 % SLN C Estimated Average Glucose 160   H <117 mg/dL SLN  Assessment/Plan 1. Chronic systolic heart failure -lasix was reduced to 60 mG BID, pt tolerated dose well, Cr improved, no increase in edema, fluid retention, shortness of breath -has follow up with cardiology scheduled for next week  2. Chronic atrial fibrillation -rate controlled with digoxin and on pradaxa for  anticoagulant  3. Essential hypertension, benign - blood pressures have been stable  4. HYPERTHYROIDISM -conts to follow with endocrine, completed 3 weeks of methimazole 10 mg daily for 3 weeks  5. Type II or unspecified type diabetes mellitus with neurological manifestations, not stated as uncontrolled(250.60) Blood sugars reviewed, fasting blood sugars slightly elevated will cont to monitor this and may need to increase insulin if cont to trend up.   6. Morbid obesity -conts with gradual weight loss  7. SLEEP APNEA -has sleep study scheduled for next week

## 2014-06-27 ENCOUNTER — Telehealth: Payer: Self-pay | Admitting: Endocrinology

## 2014-06-27 ENCOUNTER — Encounter: Payer: Self-pay | Admitting: Endocrinology

## 2014-06-27 ENCOUNTER — Ambulatory Visit (INDEPENDENT_AMBULATORY_CARE_PROVIDER_SITE_OTHER): Payer: Medicaid Other | Admitting: Endocrinology

## 2014-06-27 VITALS — BP 122/82 | HR 75 | Temp 98.5°F | Resp 14

## 2014-06-27 DIAGNOSIS — E0510 Thyrotoxicosis with toxic single thyroid nodule without thyrotoxic crisis or storm: Secondary | ICD-10-CM

## 2014-06-27 LAB — TSH: TSH: 0.02 u[IU]/mL — AB (ref 0.35–4.50)

## 2014-06-27 LAB — T4, FREE: Free T4: 1.01 ng/dL (ref 0.60–1.60)

## 2014-06-27 LAB — T3, FREE: T3, Free: 3.3 pg/mL (ref 2.3–4.2)

## 2014-06-27 NOTE — Telephone Encounter (Signed)
Candace Gallus called about patients lab work and want to know if a tsh was drawn, or do she need to draw it.  Thank you

## 2014-06-27 NOTE — Telephone Encounter (Signed)
[name] called,  stated she faxed labs over, and that patient haven't had a tsh since April.  Thank you

## 2014-06-27 NOTE — Progress Notes (Signed)
Patient ID: [name], male   DOB: 07/16/1951, 63 y.o.   MRN: [id number]                                                                Dustin DredgeSylvester Coccia  Reason for Appointment:  Hyperthyroidism     History of Present Illness:    Hyperthyroidism  diagnosis was made probably in early 2014. No details are available since apparently he was transferred to his current nursing home already taking methimazole 10 mg daily in early 2014 Not clear if he had any symptoms at diagnosis and he does not think he had any of the usual symptoms of  palpitations, shakiness, nervousness, feeling excessively warm and sweaty or fatigue. He does have a previous history of atrial fibrillation He had been losing weight gradually over the last 3 years with a baseline weight of about 400 pounds; is still losing weight but not at a faster pace. 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 He has not continued on methimazole which was given for only 3 weeks after his last visit in 5/15.  He does not feel any different since his last visit and has no unusual fatigue or palpitations  Thyroid labs:   Lab Results  Component Value Date   FREET4 1.92* 04/22/2014   FREET4 1.91* 02/04/2014   FREET4 1.30 02/25/2011   TSH 0.03* 04/22/2014   TSH 0.02* 02/04/2014   TSH <0.008 uIU/mL* 02/22/2011    Problem 2: THYROID nodule He apparently had a thyroid nodule in 2012 measuring about 3.5 cm. Followup ultrasound in 06/2013 showed the following: Focal nodules: Lower pole right-sided nodule which is mildly hyperechoic. 1.2 x 1.3 x 1.2 cm.  Similar to minimally enlarged from 1.0 x 0.9 x 1.0 cm on the prior exam.  An inter/upper pole minimally hypoechoic right-sided nodule measures 9 x 6 x 7 mm. Not readily apparent on the prior exam.  Left-sided inter/lower pole heterogeneous solid mass. 3.5 x 3.0 x 3.0 cm. On the prior exam, this measured 3.5 x 2.3 x 2.3 cm.   No biopsy was done.    Medication List       This list is accurate as of: 06/27/14  8:56 AM.  Always use your most recent med list.               Cholecalciferol 1000 UNITS tablet  Take 1,000 Units by mouth daily.     cloNIDine 0.1 MG tablet  Commonly known as:  CATAPRES  Take 0.2 mg by mouth 2 (two) times daily.     dabigatran 150 MG Caps capsule  Commonly known as:  PRADAXA  Take 1 capsule (150 mg total) by mouth every 12 (twelve) hours.     digoxin 0.125 MG tablet  Commonly known as:  LANOXIN  Take 0.375 mg by mouth daily. Takes 3  per day     divalproex 250 MG DR tablet  Commonly known as:  DEPAKOTE  Take 250 mg by mouth 2 (two) times daily.     finasteride 5 MG tablet  Commonly known as:  PROSCAR  Take 5 mg by mouth daily.     furosemide 40 MG tablet  Commonly known as:  LASIX  Take 60 mg by mouth 2 (two) times daily.     gabapentin 300 MG capsule  Commonly known as:  NEURONTIN  Take 1 capsule (300 mg total) by mouth 2 (two) times daily.     insulin glargine 100 UNIT/ML injection  Commonly known as:  LANTUS  Inject 25 Units into the skin at bedtime.     lidocaine 5 %  Commonly known as:  LIDODERM  Place 1 patch onto the skin daily. 1/2 patch to both shins daily     lisinopril 40 MG tablet  Commonly known as:  PRINIVIL,ZESTRIL  Take 1 tablet (40 mg total) by mouth daily.     metFORMIN 500 MG tablet  Commonly known as:  GLUCOPHAGE  Take 1,000 mg by mouth 2 (two) times daily with a meal.     polyethylene glycol packet  Commonly known as:  MIRALAX / GLYCOLAX  Take 17 g by mouth at bedtime.     potassium chloride SA 20 MEQ tablet    Commonly known as:  K-DUR,KLOR-CON  Take 40 mEq by mouth 2 (two) times daily.     sodium chloride 0.65 % nasal spray  Commonly known as:  OCEAN  Place 1 spray into the nose 2 (two) times daily.     spironolactone 25 MG tablet  Commonly known as:  ALDACTONE  Take 1 tablet (25 mg total) by mouth daily.     tamsulosin 0.4 MG Caps capsule  Commonly known as:  FLOMAX  Take 0.4 mg by mouth daily.            Past Medical History  Diagnosis Date  . 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 He had markedly elevated LV filling pressures and pulmonary venous hypertension with normal pulmonary vascular resistance suggesting he will normalize his pulmonary pressures with adequate diuresis.   . Renal insufficiency   . Sleep apnea   . Morbid obesity   . A-fib   . Chronic diastolic heart failure     Echo 8.25.2011:  Mod LVH; EF 50%; Mod LAE; RV dilation and ? dysfxn; mild RAE;   . Other primary cardiomyopathies   . Dyslipidemia   . Essential hypertension, benign   . Diabetes mellitus without mention of complication   . Atrial fibrillation 08/26/2009    Annotation: Permanent Not a coumadin candidate secondary to noncompliance Qualifier: Diagnosis of  By: Huntley Dec, Scott    . Chronic systolic heart failure 08/26/2009      Qualifier: Diagnosis of  By: Huntley Dec, Scott     . CORONARY ARTERY DISEASE, S/P PTCA 10/08/2009    Qualifier: Diagnosis of  By: Trevor Iha, RN, Heather    . Acute and chronic respiratory failure (acute-on-chronic) 02/07/2013  . HYPERTHYROIDISM 02/23/2011    Qualifier: Diagnosis of  By: Daphine Deutscher FNP, Zena Amos    . BPH (benign prostatic hyperplasia) 07/23/2013  . Depression 03/13/2013  . Chronic pain 03/13/2013  . Hyperkalemia 02/07/2013  . SLEEP APNEA 08/26/2009    Annotation: diagnosed by overnight oximetry in hospital in Aug. 2010 does not  want to wear CPAP Qualifier:  Diagnosis of  By: Brynda Rim      Past Surgical History  Procedure Laterality Date  . None      Family History  Problem Relation Age of Onset  . Hypertension      Aunts    Social History:  reports that he has never smoked. He does not have any smokeless tobacco history on file. He reports that he does not drink alcohol or use illicit drugs.  Allergies: No Known Allergies  Review of Systems:  CARDIOLOGY:  he has a  history of high blood pressure.  Also apparently has had CHF but recently asymptomatic. History of atrial fibrillation     ENDOCRINOLOGY:  he has had long-standing diabetes, last A1c was 7.2 in 6/15 He is being treated by his PCP with Lantus and metformin He has renal insufficiency with last creatinine 1.6 He says he has had significant peripheral neuropathy in his legs causing weakness and is using a wheelchair to ambulate.            Examination:   BP 122/82  Pulse 75  Temp(Src) 98.5 F (36.9 C)  Resp 14  SpO2 97%   General Appearance:  he has marked generalized obesity. He looks well  Eyes: No prominence or eyelid swelling  Neck: The thyroid is not palpable on the right side. Has a barely palpable smooth rounded nodule felt with difficulty on swallowing on the left side Neurological: REFLEXES: At biceps appear normal.  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 TSH  06/27/2014 0.02* 0.35 - 4.50 uIU/mL Final  . Free T4 06/27/2014 1.01  0.60 - 1.60 ng/dL Final  . T3, Free 50/38/8828 3.3  2.3 - 4.2 pg/mL Final

## 2014-06-27 NOTE — Telephone Encounter (Signed)
It was drawn here, they are aware

## 2014-06-28 ENCOUNTER — Encounter: Payer: Self-pay | Admitting: Physician Assistant

## 2014-06-28 ENCOUNTER — Ambulatory Visit (INDEPENDENT_AMBULATORY_CARE_PROVIDER_SITE_OTHER): Payer: Medicaid Other | Admitting: Physician Assistant

## 2014-06-28 VITALS — BP 100/68 | HR 52 | Ht 64.0 in | Wt 311.0 lb

## 2014-06-28 DIAGNOSIS — N259 Disorder resulting from impaired renal tubular function, unspecified: Secondary | ICD-10-CM

## 2014-06-28 DIAGNOSIS — I482 Chronic atrial fibrillation, unspecified: Secondary | ICD-10-CM

## 2014-06-28 DIAGNOSIS — I5032 Chronic diastolic (congestive) heart failure: Secondary | ICD-10-CM

## 2014-06-28 DIAGNOSIS — Z01810 Encounter for preprocedural cardiovascular examination: Secondary | ICD-10-CM

## 2014-06-28 DIAGNOSIS — G4730 Sleep apnea, unspecified: Secondary | ICD-10-CM

## 2014-06-28 DIAGNOSIS — I4891 Unspecified atrial fibrillation: Secondary | ICD-10-CM

## 2014-06-28 DIAGNOSIS — I10 Essential (primary) hypertension: Secondary | ICD-10-CM

## 2014-06-28 DIAGNOSIS — I2510 Atherosclerotic heart disease of native coronary artery without angina pectoris: Secondary | ICD-10-CM

## 2014-06-28 DIAGNOSIS — E0590 Thyrotoxicosis, unspecified without thyrotoxic crisis or storm: Secondary | ICD-10-CM

## 2014-06-28 LAB — HEPATIC FUNCTION PANEL
ALK PHOS: 45 U/L (ref 25–125)
ALT: 9 U/L — AB (ref 10–40)
AST: 11 U/L — AB (ref 14–40)
BILIRUBIN DIRECT: 0.1 mg/dL
BILIRUBIN, TOTAL: 0.3 mg/dL

## 2014-06-28 MED ORDER — DIGOXIN 125 MCG PO TABS: 0.1250 mg | ORAL_TABLET | Freq: Every day | ORAL | Status: DC

## 2014-06-28 NOTE — Progress Notes (Signed)
Cardiology Office Note    Date:  06/28/2014   ID:  [name], DOB Sep 16, 1951, MRN [id number]  PCP:  Terald Sleeper, MD  Cardiologist:  Dr. Rollene Rotunda      History of Present Illness: [name] is a 63 y.o. male prior patient of mine at [facility].  He has a history of obesity, OSA/OHS (not on CPAP), nonobstructive CAD (Cath 2010), chronic atrial fib on pradaxa, Chronic diastolic HF with RHF/severe pulmonary HTN, HTN, DM, HLD, hyperthyroidism 2/2 toxic L thyroid nodule.  Previous EF was 35-40%.  EF 50% by last echo in 01/2013.  He was admitted in 01/2013 with volume overload.  He has not been seen by cardiology since that time.  He returns for follow up.  He needs surgical clearance for a dental extraction.  He apparently has several teeth to be pulled.  He tells me it has to be done with GA.  He is not sure of the dentist's name.  He was sent here by his PCP.  He denies chest pain, orthopnea, PND, edema.  He is in a wheelchair mainly.  He can walk with a Florio.  He denies significant DOE.  He denies weight gain.  He denies syncope, dizziness, near syncope.  Denies significant fatigue.     Studies:  - LHC (07/2009):  Distal LAD 50%, circumflex 30%, distal circumflex 50-60%, mid RCA 30%, AM 40%  - Echo (02/08/13):  Mild LVH, EF 50%, D-shaped interventricular septum, trivial MR, mild LAE, severe RVE, moderately reduced RVSF, moderate RAE, PASP 82-86 mm Hg (severe pulmonary HTN), trivial effusion   Recent Labs: 06/27/2014: TSH 0.02* 05/20/2014:  hgb 13.2 06/05/2014:  K 4.6, Cr 1.13   Wt Readings from Last 3 Encounters:  06/28/14 311 lb (141.069 kg)  04/22/14 312 lb (141.522 kg)  02/18/14 314 lb (142.429 kg)     Past Medical History  Diagnosis Date  . 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 He had markedly elevated LV filling pressures and pulmonary venous  hypertension with normal pulmonary vascular resistance suggesting he will normalize his pulmonary pressures with adequate diuresis.   . Renal insufficiency   . Sleep apnea   . Morbid obesity   . A-fib   . Chronic diastolic heart failure     Echo 8.25.2011:  Mod LVH; EF 50%; Mod LAE; RV dilation and ? dysfxn; mild RAE;   . Other primary cardiomyopathies   . Dyslipidemia   . Essential hypertension, benign   . Diabetes mellitus without mention of complication   . Atrial fibrillation 08/26/2009    Annotation: Permanent Not a coumadin candidate secondary to noncompliance Qualifier: Diagnosis of  By: Huntley Dec, Amoni Scallan    . Chronic systolic heart failure 08/26/2009      Qualifier: Diagnosis of  By: Huntley Dec, Blanch Stang     . CORONARY ARTERY DISEASE, S/P PTCA 10/08/2009    Qualifier: Diagnosis of  By: Trevor Iha, RN, Heather    . Acute and chronic respiratory failure (acute-on-chronic) 02/07/2013  . HYPERTHYROIDISM 02/23/2011    Qualifier: Diagnosis of  By: Daphine Deutscher FNP, Zena Amos    . BPH (benign prostatic hyperplasia) 07/23/2013  . Depression 03/13/2013  . Chronic pain 03/13/2013  . Hyperkalemia 02/07/2013  . SLEEP APNEA 08/26/2009    Annotation: diagnosed by overnight oximetry in hospital in Aug. 2010 does not want to wear CPAP Qualifier: Diagnosis of  By: Huntley Dec, Cael Worth  Current Outpatient Prescriptions  Medication Sig Dispense Refill  . Cholecalciferol 1000 UNITS tablet Take 1,000 Units by mouth daily.      . cloNIDine (CATAPRES) 0.1 MG tablet Take 0.2 mg by mouth 2 (two) times daily.       . dabigatran (PRADAXA) 150 MG CAPS Take 1 capsule (150 mg total) by mouth every 12 (twelve) hours.  60 capsule  3  . digoxin (LANOXIN) 0.125 MG tablet Take 0.375 mg by mouth daily. Takes 3 per day      . divalproex (DEPAKOTE) 250 MG DR tablet Take 250 mg by mouth 2 (two) times daily.      . finasteride (PROSCAR) 5 MG tablet Take 5 mg by mouth daily.      . furosemide (LASIX) 40 MG tablet Take 60 mg by mouth 2  (two) times daily.      Marland Kitchen. gabapentin (NEURONTIN) 300 MG capsule Take 1 capsule (300 mg total) by mouth 2 (two) times daily.      . insulin glargine (LANTUS) 100 UNIT/ML injection Inject 25 Units into the skin at bedtime.       . lidocaine (LIDODERM) 5 % Place 1 patch onto the skin daily. 1/2 patch to both shins daily      . lisinopril (PRINIVIL,ZESTRIL) 40 MG tablet Take 1 tablet (40 mg total) by mouth daily.      . metFORMIN (GLUCOPHAGE) 500 MG tablet Take 1,000 mg by mouth 2 (two) times daily with a meal.      . polyethylene glycol (MIRALAX / GLYCOLAX) packet Take 17 g by mouth at bedtime.       . potassium chloride SA (K-DUR,KLOR-CON) 20 MEQ tablet Take 40 mEq by mouth 2 (two) times daily.       . sodium chloride (OCEAN) 0.65 % nasal spray Place 1 spray into the nose 2 (two) times daily.      Marland Kitchen. spironolactone (ALDACTONE) 25 MG tablet Take 1 tablet (25 mg total) by mouth daily.      . tamsulosin (FLOMAX) 0.4 MG CAPS capsule Take 0.4 mg by mouth daily.       No current facility-administered medications for this visit.    Allergies:   Review of patient's allergies indicates no known allergies.   Social History:  The patient  reports that he has never smoked. He does not have any smokeless tobacco history on file. He reports that he does not drink alcohol or use illicit drugs.   Family History:  The patient's family history includes Hypertension in an other family member.   ROS:  Please see the history of present illness.   No bleeding problems.    All other systems reviewed and negative.   PHYSICAL EXAM: VS:  BP 100/68  Pulse 52  Ht 5\' 4"  (1.626 m)  Wt 311 lb (141.069 kg)  BMI 53.36 kg/m2 Well nourished, well developed, in no acute distress HEENT: normal Neck: no JVDat 90 degrees Cardiac:  normal S1, S2; irreg irreg; no murmur Lungs:  clear to auscultation bilaterally, no wheezing, rhonchi or rales Abd: soft, nontender, no hepatomegaly Ext: no edema Skin: warm and dry Neuro:  CNs  2-12 intact, no focal abnormalities noted  EKG:  AFib, HR 52, diffuse ST changes (? Dig effect)     ASSESSMENT AND PLAN:  1. Pre-operative cardiovascular examination:  He does not have any unstable cardiac conditions.  His volume is stable.  HR is controlled in chronic AFib.  He is not having any symptoms of  angina.  His dental procedure is fairly low risk.  I reviewed this with Dr. Delane Ginger (DOD).  At this point, we do not feel he needs further cardiac testing prior to his non-cardiac procedure. He may proceed as planned.  Given his co-morbidities, he is at mild to moderate risk for perioperative complications.   2. Atrial fibrillation, chronic:  He denies a hx of CVA.  If his Pradaxa needs to be held, it should be stopped 2 days prior to his procedure and resumed as soon as possible after his procedure.  His creatinine is stable and he may continue on his current dose.  His HR is slow and he is taking an unusually large dose of Digoxin.  Decrease Digoxin to 0.125 mg QD. 3. Chronic diastolic heart failure:  Volume appears stable.  Continue current Rx.  4. Coronary Artery Disease:  No angina. He is no on ASA as he is on Pradaxa.   5. Essential hypertension, benign:  Controlled.  6. HYPERTHYROIDISM:  F/u with endocrine. 7. RENAL INSUFFICIENCY:  Recent creatinine stable.  8. SLEEP APNEA:  He has a sleep study pending.  9. Disposition:  He would benefit from being followed in the CHF clinic and has seen Dr. Arvilla Meres in the past.  Will arrange follow up there in 3 mos.     Signed, Brynda Rim, MHS 06/28/2014 11:49 AM    [facility] [address] [place], [place], [place]  [address] Phone: [phone]; Fax: [phone]

## 2014-06-28 NOTE — Patient Instructions (Signed)
Your physician has recommended you make the following change in your medication:   1. Decrease Digoxin 0.125 mg to 1 tablet daily.   Your physician recommends that you schedule a follow-up appointment in: 3 months with Dr. Gala Romney at [facility].

## 2014-06-30 ENCOUNTER — Ambulatory Visit (HOSPITAL_BASED_OUTPATIENT_CLINIC_OR_DEPARTMENT_OTHER): Payer: Medicaid Other | Attending: Internal Medicine

## 2014-06-30 VITALS — Ht 64.0 in | Wt 310.0 lb

## 2014-06-30 DIAGNOSIS — I4891 Unspecified atrial fibrillation: Secondary | ICD-10-CM | POA: Insufficient documentation

## 2014-06-30 DIAGNOSIS — G4733 Obstructive sleep apnea (adult) (pediatric): Secondary | ICD-10-CM | POA: Insufficient documentation

## 2014-06-30 DIAGNOSIS — I4949 Other premature depolarization: Secondary | ICD-10-CM | POA: Diagnosis not present

## 2014-06-30 DIAGNOSIS — R0989 Other specified symptoms and signs involving the circulatory and respiratory systems: Secondary | ICD-10-CM | POA: Insufficient documentation

## 2014-06-30 DIAGNOSIS — G4710 Hypersomnia, unspecified: Secondary | ICD-10-CM | POA: Diagnosis present

## 2014-06-30 DIAGNOSIS — R0609 Other forms of dyspnea: Secondary | ICD-10-CM | POA: Insufficient documentation

## 2014-07-06 DIAGNOSIS — G4733 Obstructive sleep apnea (adult) (pediatric): Secondary | ICD-10-CM

## 2014-07-06 NOTE — Sleep Study (Signed)
   NAME: [name] DATE OF BIRTH:  March 25, 1951 MEDICAL RECORD NUMBER [id number]  LOCATION: [facility]  PHYSICIAN: Kenneth Lax D  DATE OF STUDY: 06/30/2014  SLEEP STUDY TYPE: Nocturnal Polysomnogram               REFERRING PHYSICIAN: Maxwell Caul, MD  INDICATION FOR STUDY: Hypersomnia with sleep apnea  EPWORTH SLEEPINESS SCORE:   4/24 HEIGHT: 5\' 4"  (162.6 cm)  WEIGHT: 310 lb (140.615 kg)    Body mass index is 53.19 kg/(m^2).  NECK SIZE: 16 in.  MEDICATIONS: Charted for review  SLEEP ARCHITECTURE: Split study protocol. During the diagnostic phase, total sleep time 125 minutes with sleep efficiency 83.9%. Stage I was 4.8%, stage II 72.4%, stage III absent, REM 22.8% of total sleep time. Sleep latency 12 minutes, REM latency 88 minutes, awake after sleep onset 12 minutes, arousal index 2.9, bedtime medication: None  RESPIRATORY DATA: Apnea hypopneas index (AHI) 34.1 per hour. 71 total events scored including 11 obstructive apneas and 60 hypopneas. All events were associated with non-supine sleep position. REM AHI 61.1 per hour. CPAP titrated to 17 CWP, AHI 0 per hour. He wore a medium fullface mask.  OXYGEN DATA: Very loud snoring before CPAP with oxygen desaturation to a nadir of 77% on room air. With CPAP control, snoring was prevented and mean oxygen saturation of 94.1% on room air.  CARDIAC DATA: Rhythm appears to be atrial fibrillation with occasional PVC or aberrant beat and mean heart rate 93.5 per minute.  MOVEMENT/PARASOMNIA: No significant movement disturbance, no bathroom trips  IMPRESSION/ RECOMMENDATION:   1) Severe obstructive sleep apnea/ hypopnea syndrome, AHI 34.1 per hour with nonsupine events. Very loud snoring with oxygen desaturation to a nadir of 77% on room air.  2) Successful CPAP titration to 17 CWP, AHI 0 per hour. He wore a medium [employer] Simplus fullface mask with heated humidifier. Snoring was prevented and mean oxygen  saturation was 94.1% on room air with CPAP 3) atrial fibrillation with mean ventricular response rate 93.5 per minute    [name] Diplomate, American Board of Sleep Medicine  ELECTRONICALLY SIGNED ON:  07/06/2014, 9:52 AM [facility] PH: [phone]   FX: [phone] ACCREDITED BY THE AMERICAN ACADEMY OF SLEEP MEDICINE

## 2014-07-09 ENCOUNTER — Non-Acute Institutional Stay (SKILLED_NURSING_FACILITY): Payer: Medicaid Other | Admitting: Internal Medicine

## 2014-07-09 DIAGNOSIS — I50810 Right heart failure, unspecified: Secondary | ICD-10-CM

## 2014-07-09 DIAGNOSIS — E0590 Thyrotoxicosis, unspecified without thyrotoxic crisis or storm: Secondary | ICD-10-CM

## 2014-07-09 DIAGNOSIS — I482 Chronic atrial fibrillation, unspecified: Secondary | ICD-10-CM

## 2014-07-09 DIAGNOSIS — I4891 Unspecified atrial fibrillation: Secondary | ICD-10-CM

## 2014-07-09 DIAGNOSIS — I509 Heart failure, unspecified: Secondary | ICD-10-CM

## 2014-07-09 DIAGNOSIS — G4733 Obstructive sleep apnea (adult) (pediatric): Secondary | ICD-10-CM

## 2014-07-09 NOTE — Progress Notes (Signed)
Patient ID: [name], male   DOB: 1951/07/28, 63 y.o.   MRN: [id number]  Facility; [facility] Chief complaint; preoperative review for general anesthesia for multiple teeth extractions History; I been asked to review Mr. Pertile who apparently has several worn a necrotic teeth which need general anesthesiologist and hospitalization. The patient has multiple medical issues which makes selective general anesthesia somewhat problematic. These include; 1) uncontrolled hyperthyroidism. This is secondary to a toxic thyroid nodule. He received I-131 under the direction of Dr. Lucianne Muss earlier this year. His recent free T3 and free T4 are normal 2) he was hospitalized shortly after his arrival in the building for right heart failure with severe pulmonary hypertension. At that point I think he had a CPAP although his compliance with this was less than optimal. He tells me he has not had a sleep study his echocardiogram is listed below. His PA systolic pressure was listed in the 80s during this study. The patient states that he has had sleep studies arranged in the past but for one reason or another they never seemed to go through I don't have any tangible memory of this. I will try to rearrange this. He probably also needs an arterial blood gas to see what his underlying PCO2 is although he does not have an elevated total CO2 on his metabolic panel from January. His blood gas that I did is surprisingly normal with a normal pH, PO2 of 75 and PCO2 in the 30s.  3) Chronic Atrial fibrillation. On pradaxa, digoxin 0.375 daily, Lasix 80 mg twice a day, Aldactone 25 mg daily K. Dur 40 mEq twice a day, lisinopril 40 mg daily #4 hypertension in addition to the above he is also on Catapres 0.2 twice a day #5 type 2 diabetes; on Lantus insulin 25 units as well as metformin 1000 twice a day    ATTENDING    Zubelevitskiy, Konstantin cc:  ------------------------------------------------------------ LV EF:  50%  ------------------------------------------------------------ Indications:      CHF - 428.0.  ------------------------------------------------------------ History:   PMH:  No prior cardiac history.  Atrial fibrillation.  Coronary artery disease.  Angina pectoris. Risk factors:  Emphysema. Sleep apnea. Cardiomyopathy. Bradycardia. Obesity. Diabetes mellitus. Dyslipidemia.  ------------------------------------------------------------ Study Conclusions  - Left ventricle: The cavity size was normal. Wall thickness   was increased in a pattern of mild LVH. The estimated   ejection fraction was 50%. Mild global hypokinesis.   Indeterminant diastolic function. - Ventricular septum: D-shaped interventricular septum   suggestive of RV pressure and volume overload. - Aortic valve: There was no stenosis. - Mitral valve: Trivial regurgitation. - Left atrium: The atrium was mildly dilated. - Right ventricle: The cavity size was severely dilated.   Systolic function was moderately reduced. - Right atrium: The atrium was moderately dilated. - Tricuspid valve: Peak RV-RA gradient: 51mm Hg (S). - Pulmonary arteries: PA systolic pressure 82-86 mmHg. - Systemic veins: IVC measured 2.4 cm with < 50%   respirophasic variation, suggesting RA pressure 11-15   mmHg. - Pericardium, extracardiac: A trivial pericardial effusion   was identified posterior to the heart. Impressions:  - Normal LV size with mild global hypokinesis, EF 50%. Mild   LVH. D-shaped interventricular septum suggestive of RV   pressure/volume overload. Moderate RAE, mild LAE. Severely   dilated RV with moderately decreased systolic function.   Severe pulmonary hypertension. Transthoracic echocardiography.  M-mode, complete 2D, spectral Doppler, and color Doppler.  Height:  Height: 160cm. Height: 63in.  Weight:  Weight: 177.8kg. Weight: 391.2lb.  Body mass index:  BMI: 69.4kg/m^2.  Body surface area:    BSA: 2.1935m^2.   Patient status:  Inpatient. Location:  ICU/CCU  ------------------------------------------------------------  ------------------------------------------------------------ Left ventricle:  The cavity size was normal. Wall thickness was increased in a pattern of mild LVH. The estimated ejection fraction was 50%. Mild global hypokinesis. Indeterminant diastolic function.  ------------------------------------------------------------ Aortic valve:   Mildly calcified leaflets.  Doppler:   There was no stenosis.    No regurgitation.  ------------------------------------------------------------ Aorta:  Aortic root: The aortic root was normal in size. Ascending aorta: The ascending aorta was normal in size.  ------------------------------------------------------------ Mitral valve:   Doppler:   There was no evidence for stenosis.    Trivial regurgitation.  ------------------------------------------------------------ Left atrium:  The atrium was mildly dilated.  ------------------------------------------------------------ Right ventricle:  The cavity size was severely dilated. Systolic function was moderately reduced.  ------------------------------------------------------------ Ventricular septum:   D-shaped interventricular septum suggestive of RV pressure and volume overload.  ------------------------------------------------------------ Pulmonic valve:    Structurally normal valve.   Cusp separation was normal.  Doppler:  Transvalvular velocity was within the normal range.  Trivial regurgitation.  ------------------------------------------------------------ Tricuspid valve:   Doppler:   Trivial regurgitation.  ------------------------------------------------------------ Pulmonary artery:   PA systolic pressure 82-86 mmHg.  ------------------------------------------------------------  Right atrium:  The atrium was moderately  dilated.  ------------------------------------------------------------ Pericardium:  A trivial pericardial effusion was identified posterior to the heart.  ------------------------------------------------------------ Systemic veins:  IVC measured 2.4 cm with < 50% respirophasic variation, suggesting RA pressure 11-15 mmHg.   Physical examination; O2 sat is 95% on room air pulse rate 65 and irregular respirations 18 and unlabored Gen. pleasant cooperative alert man clearly has morbid obesity. 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 He has been successfully treated his recent free T3 and free T4 are normal #2 severe sleep apnea/obstructive sleep apnea with an apnea AHI of 34. He was fitted with nasal CPAP I believe which seemed to relieve the symptoms. I think Dr. Maple HudsonYoung will take care of prescribing this for him. I have emphasized compliance. In the absence of pain other explanation for his pulmonary hypertension and cor pulmonale I suspect this is the cause #3 type 2 diabetes on insulin this would appear to be better controlled. He is on Lantus insulin at 25 units at bedtime Glucophage 1000 twice a day and. His blood sugars appear to be very well controlled he is due for a hemoglobin A1c #4 atrial fibrillation he is on pradaxa 150 twice a day, Lasix 80 twice a day Spironolactone 25 mg daily, lisinopril 40 mg daily and digoxin 0.375 daily. He will need a followup dig level, basic metabolic panel  I think this patient is cleared for his dental surgery. The pradaxa will need to be held prior to this. Otherwise now that all of his medical issues have been better controlled I don't have that particular issue with proceeding with his elective dental procedure. He is already been cleared by cardiology  although I don't know that they really addressed his previous right heart failure. Mention above I think this is all cor pulmonale from severe chronic obstructive sleep apnea

## 2014-07-10 LAB — BASIC METABOLIC PANEL
BUN: 23 mg/dL — AB (ref 4–21)
Creatinine: 1.2 mg/dL (ref 0.6–1.3)
Glucose: 148 mg/dL
Potassium: 4.2 mmol/L (ref 3.4–5.3)
Sodium: 137 mmol/L (ref 137–147)

## 2014-07-10 LAB — HEMOGLOBIN A1C: Hgb A1c MFr Bld: 7.5 % — AB (ref 4.0–6.0)

## 2014-08-02 ENCOUNTER — Non-Acute Institutional Stay (SKILLED_NURSING_FACILITY): Payer: Medicaid Other | Admitting: Nurse Practitioner

## 2014-08-02 DIAGNOSIS — F32A Depression, unspecified: Secondary | ICD-10-CM

## 2014-08-02 DIAGNOSIS — F329 Major depressive disorder, single episode, unspecified: Secondary | ICD-10-CM

## 2014-08-02 DIAGNOSIS — I5032 Chronic diastolic (congestive) heart failure: Secondary | ICD-10-CM

## 2014-08-02 DIAGNOSIS — E559 Vitamin D deficiency, unspecified: Secondary | ICD-10-CM

## 2014-08-02 DIAGNOSIS — I10 Essential (primary) hypertension: Secondary | ICD-10-CM

## 2014-08-02 DIAGNOSIS — K5901 Slow transit constipation: Secondary | ICD-10-CM

## 2014-08-02 DIAGNOSIS — G8929 Other chronic pain: Secondary | ICD-10-CM

## 2014-08-02 DIAGNOSIS — I482 Chronic atrial fibrillation, unspecified: Secondary | ICD-10-CM

## 2014-08-02 DIAGNOSIS — E1149 Type 2 diabetes mellitus with other diabetic neurological complication: Secondary | ICD-10-CM

## 2014-08-02 DIAGNOSIS — F3289 Other specified depressive episodes: Secondary | ICD-10-CM

## 2014-08-02 DIAGNOSIS — I4891 Unspecified atrial fibrillation: Secondary | ICD-10-CM

## 2014-08-02 NOTE — Progress Notes (Signed)
Patient ID: [name], male   DOB: 1951/03/25, 63 y.o.   MRN: [id number]    Nursing Home Location:  [facility]   Place of Service: SNF (31)  PCP: Terald Sleeper, MD  No Known Allergies  Chief Complaint  Patient presents with  . Medical Management of Chronic Issues    Routine Visit     HPI:  63 year old male patient with a pmh of T2DM, Obesity,HTN, and CHF, hyperthyroidism, BPH seen for routine visit and medical management of chronic conditions. Pt looking to move to independent living. Reports he would like to be on less medication. extensive review of medication done with pt   Review of Systems:  Review of Systems  Constitutional: Negative for fever, chills and malaise/fatigue.  HENT: Negative for ear pain, hearing loss, sore throat and tinnitus.   Respiratory: Negative for cough, sputum production and shortness of breath.   Cardiovascular: Positive for leg swelling (was having leg swelling this has now improved). Negative for chest pain.  Gastrointestinal: Negative for heartburn, abdominal pain, diarrhea and constipation.  Genitourinary: Positive for frequency (unchanged; history of BPH and on diuretics). Negative for dysuria and urgency.  Musculoskeletal: Negative for joint pain and myalgias.  Skin: Negative.  Negative for itching and rash.  Neurological: Negative for dizziness, tingling and headaches.  Psychiatric/Behavioral: Negative for depression, suicidal ideas and hallucinations. The patient is not nervous/anxious and does not have insomnia.      Past Medical History  Diagnosis Date  . 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 He had markedly elevated LV filling pressures and pulmonary venous hypertension with normal pulmonary vascular resistance suggesting he will normalize his pulmonary pressures with adequate diuresis.   . Renal  insufficiency   . Sleep apnea   . Morbid obesity   . A-fib   . Chronic diastolic heart failure     Echo 8.25.2011:  Mod LVH; EF 50%; Mod LAE; RV dilation and ? dysfxn; mild RAE;   . Other primary cardiomyopathies   . Dyslipidemia   . Essential hypertension, benign   . Diabetes mellitus without mention of complication   . Atrial fibrillation 08/26/2009    Annotation: Permanent Not a coumadin candidate secondary to noncompliance Qualifier: Diagnosis of  By: Huntley Dec, Scott    . Chronic systolic heart failure 08/26/2009      Qualifier: Diagnosis of  By: Huntley Dec, Scott     . CORONARY ARTERY DISEASE, S/P PTCA 10/08/2009    Qualifier: Diagnosis of  By: Trevor Iha, RN, Heather    . Acute and chronic respiratory failure (acute-on-chronic) 02/07/2013  . HYPERTHYROIDISM 02/23/2011    Qualifier: Diagnosis of  By: Daphine Deutscher FNP, Zena Amos    . BPH (benign prostatic hyperplasia) 07/23/2013  . Depression 03/13/2013  . Chronic pain 03/13/2013  . Hyperkalemia 02/07/2013  . SLEEP APNEA 08/26/2009    Annotation: diagnosed by overnight oximetry in hospital in Aug. 2010 does not want to wear CPAP Qualifier: Diagnosis of  By: Brynda Rim     Past Surgical History  Procedure Laterality Date  . None     Social History:   reports that he has never smoked. He does not have any smokeless tobacco history on file. He reports that he does not drink alcohol or use illicit drugs.  Family History  Problem Relation Age of Onset  . Hypertension      Aunts    Medications:  Patient's Medications  New Prescriptions   No medications on file  Previous Medications   ACETAMINOPHEN (TYLENOL) 325 MG TABLET    2 by mouth every 4 hours for headache or fever > 100, if fever 101.5 contact MD. Do not exceed 4 gm of tylenol in 24 hours   CHOLECALCIFEROL 1000 UNITS TABLET    Take 1,000 Units by mouth daily.   CLONIDINE (CATAPRES) 0.2 MG TABLET    Take 0.2 mg by mouth 2 (two) times daily. For CHF   DABIGATRAN (PRADAXA) 150 MG  CAPS    Take 1 capsule (150 mg total) by mouth every 12 (twelve) hours.   DIGOXIN (LANOXIN) 0.125 MG TABLET    Take 0.125 mg by mouth daily.   DIVALPROEX (DEPAKOTE SPRINKLE) 125 MG CAPSULE    Take 2 by mouth every night at bedtime to stabilize depressive mood   DOCUSATE SODIUM (COLACE) 100 MG CAPSULE    Take 100 mg by mouth 2 (two) times daily.   FINASTERIDE (PROSCAR) 5 MG TABLET    Take 5 mg by mouth daily. For BPH   FUROSEMIDE (LASIX) 40 MG TABLET    1 1/2 tablet by mouth twice daily for edema   INSULIN GLARGINE (LANTUS) 100 UNIT/ML INJECTION    Inject 25 Units into the skin at bedtime.    LIDOCAINE (LIDODERM) 5 %    0.05 patches. 1/2 patch topically on both shins daily and discard patch within 12 hours for Osteoarthritis   METFORMIN (GLUCOPHAGE) 1000 MG TABLET    Take 1,000 mg by mouth 2 (two) times daily with a meal. For diabetes   POTASSIUM CHLORIDE SA (K-DUR,KLOR-CON) 20 MEQ TABLET    Take 40 mEq by mouth 2 (two) times daily. For Hypopotassemic   SODIUM CHLORIDE (OCEAN) 0.65 % NASAL SPRAY    Place 1 spray into the nose 2 (two) times daily. For dry nares   TAMSULOSIN (FLOMAX) 0.4 MG CAPS CAPSULE    Take 0.4 mg by mouth daily. For BPH  Modified Medications   Modified Medication Previous Medication   GABAPENTIN (NEURONTIN) 300 MG CAPSULE gabapentin (NEURONTIN) 300 MG capsule      Take 300 mg by mouth 2 (two) times daily. For neuropathy    Take 1 capsule (300 mg total) by mouth 2 (two) times daily.   LISINOPRIL (PRINIVIL,ZESTRIL) 40 MG TABLET lisinopril (PRINIVIL,ZESTRIL) 40 MG tablet      Take 40 mg by mouth daily. For hypertension    Take 1 tablet (40 mg total) by mouth daily.   SPIRONOLACTONE (ALDACTONE) 25 MG TABLET spironolactone (ALDACTONE) 25 MG tablet      Take 25 mg by mouth daily. For hypertension    Take 1 tablet (25 mg total) by mouth daily.  Discontinued Medications   CLONIDINE (CATAPRES) 0.1 MG TABLET    Take 0.2 mg by mouth 2 (two) times daily.    DIVALPROEX (DEPAKOTE) 250 MG  DR TABLET    Take 250 mg by mouth 2 (two) times daily.   METFORMIN (GLUCOPHAGE) 500 MG TABLET    Take 1,000 mg by mouth 2 (two) times daily with a meal.   POLYETHYLENE GLYCOL (MIRALAX / GLYCOLAX) PACKET    Take 17 g by mouth at bedtime.      Physical Exam: Physical Exam  Constitutional: He is oriented to person, place, and time. No distress.  HENT:  Head: Normocephalic and atraumatic.  Eyes: Conjunctivae and EOM are normal. Pupils are equal, round, and reactive to light.  Neck: Normal range  of motion. Neck supple.  Cardiovascular: Normal rate, regular rhythm and normal heart sounds.   Pulmonary/Chest: Effort normal and breath sounds normal. No respiratory distress. He has no wheezes.  Abdominal: Soft. Bowel sounds are normal. Distention: obese. There is no tenderness.  Musculoskeletal: Normal range of motion. He exhibits edema (slight edema on right). He exhibits no tenderness.  Neurological: He is alert and oriented to person, place, and time. Gait normal. Coordination normal. GCS score is 15.  Skin: Skin is warm and dry. He is not diaphoretic. Nails show no clubbing.  Psychiatric: Mood, memory, affect and judgment normal.     Filed Vitals:   08/02/14 1021  BP: 129/84  Pulse: 84  Temp: 98 F (36.7 C)  TempSrc: Oral  Resp: 20  Weight: 321 lb (145.605 kg)  SpO2: 97%      Labs reviewed: Basic Metabolic Panel:  Recent Labs  48/54/62 0515  NA 137  K 4.2  BUN 23*  CREATININE 1.2   Liver Function Tests:  Recent Labs  06/28/14 0600  AST 11*  ALT 9*  ALKPHOS 45    Recent Labs  02/04/14 1455 04/22/14 1334 06/27/14 0903  TSH 0.02* 0.03* 0.02*   A1C: Lab Results  Component Value Date   HGBA1C 7.5* 07/10/2014     Assessment/Plan 1. Atrial fibrillation, chronic -rate controlled, conts dig and pradaxa   2. Chronic diastolic heart failure -well compensated, conts diuretics   3. Essential hypertension, benign Patients blood pressure is stable; continue  current regimen. Will monitor and make changes as necessary.  4. Unspecified vitamin D deficiency Will follow up vit d level  5. Slow transit constipation Denies symptoms, would like to stop colace, will dc at this time   6. Chronic pain -well controlled, willing to attempt dose reduction of gabapentin to once daily   7. Depression -reports no depression, no behaviors or mood swings noted, will decrease depakote to 1 tablet qhs

## 2014-08-08 ENCOUNTER — Encounter: Payer: Self-pay | Admitting: Endocrinology

## 2014-08-08 ENCOUNTER — Ambulatory Visit (INDEPENDENT_AMBULATORY_CARE_PROVIDER_SITE_OTHER): Payer: Medicaid Other | Admitting: Endocrinology

## 2014-08-08 VITALS — BP 92/55 | HR 58 | Temp 98.2°F | Resp 16 | Wt 316.6 lb

## 2014-08-08 DIAGNOSIS — E042 Nontoxic multinodular goiter: Secondary | ICD-10-CM

## 2014-08-08 LAB — TSH: TSH: 0.02 u[IU]/mL — ABNORMAL LOW (ref 0.35–4.50)

## 2014-08-08 LAB — T4, FREE: Free T4: 0.81 ng/dL (ref 0.60–1.60)

## 2014-08-08 NOTE — Progress Notes (Signed)
Patient ID: [name], male   DOB: 1951/09/06, 63 y.o.   MRN: [id number]                                                                Reason for Appointment:  Followup of thyroid    History of Present Illness:   He had hyperthyroidism initially diagnosed in early 2014. 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 Subsequently methimazole was stopped He does not feel any different since his last visit and has no unusual fatigue or palpitations  As of 7/15 thyroid levels showed a normal free T4  Thyroid labs:   Lab Results  Component Value Date   FREET4 0.81 08/08/2014   FREET4 1.01 06/27/2014   FREET4 1.92* 04/22/2014   TSH 0.02* 08/08/2014   TSH 0.02* 06/27/2014   TSH 0.03* 04/22/2014    Problem 2: Multinodular goiter He apparently had a thyroid nodule in 2012 measuring about 3.5 cm. Followup ultrasound in 06/2013 showed the following: Focal nodules: Lower pole right-sided nodule which is mildly hyperechoic. 1.2 x 1.3 x 1.2 cm. Similar to minimally enlarged from 1.0 x 0.9 x 1.0 cm on the prior exam.  An inter/upper pole minimally hypoechoic right-sided nodule measures 9 x 6 x 7 mm. Not readily apparent on the prior exam.  Left-sided inter/lower pole heterogeneous solid mass. 3.5 x 3.0 x 3.0 cm. On the prior exam, this measured 3.5 x 2.3 x 2.3 cm.   Clinically his goiter has been smaller since he was treated for hyperthyroidism     Medication List       This list is accurate as of: 08/08/14  9:24 PM.  Always use your most recent med list.               acetaminophen 325 MG tablet  Commonly known as:  TYLENOL  2 by mouth every 4  hours for headache or fever > 100, if fever 101.5 contact MD. Do not exceed 4 gm of tylenol in 24 hours     Cholecalciferol 1000 UNITS tablet  Take 1,000 Units by mouth daily.     cloNIDine 0.2 MG tablet  Commonly known as:  CATAPRES  Take 0.2 mg by mouth 2 (two) times daily. For CHF     dabigatran 150 MG Caps capsule  Commonly known as:  PRADAXA  Take 1 capsule (150 mg total) by mouth every 12 (twelve) hours.     digoxin 0.125 MG tablet  Commonly known as:  LANOXIN  Take 0.125 mg by mouth daily.     divalproex 125 MG capsule  Commonly known as:  DEPAKOTE SPRINKLE  Take 2 by mouth every night at bedtime to stabilize depressive mood     docusate sodium 100 MG capsule  Commonly known as:  COLACE  Take 100 mg by mouth 2 (two) times daily.     finasteride 5 MG tablet  Commonly known as:  PROSCAR  Take 5 mg by mouth daily. For BPH     furosemide 40 MG tablet  Commonly known as:  LASIX  1 1/2 tablet by mouth twice daily for edema     gabapentin 300 MG capsule  Commonly known as:  NEURONTIN  Take 300 mg by mouth 2 (two) times daily. For neuropathy     insulin glargine 100 UNIT/ML injection  Commonly known as:  LANTUS  Inject 25 Units into the skin at bedtime.     lidocaine 5 %  Commonly known as:  LIDODERM  0.05 patches. 1/2 patch topically on both shins daily and discard patch within 12 hours for Osteoarthritis     lisinopril 40 MG tablet  Commonly known as:  PRINIVIL,ZESTRIL  Take 40 mg by mouth daily. For hypertension     metFORMIN 1000 MG tablet  Commonly known as:  GLUCOPHAGE  Take 1,000 mg by mouth 2 (two) times daily with a meal. For diabetes     potassium chloride SA 20 MEQ tablet  Commonly known as:  K-DUR,KLOR-CON  Take 40 mEq by mouth 2 (two) times daily. For Hypopotassemic     sodium chloride 0.65 % nasal spray  Commonly known as:  OCEAN  Place 1 spray into the nose 2 (two) times daily. For dry nares     spironolactone 25 MG tablet  Commonly known  as:  ALDACTONE  Take 25 mg by mouth daily. For hypertension     tamsulosin 0.4 MG Caps capsule  Commonly known as:  FLOMAX  Take 0.4 mg by mouth daily. For BPH            Past Medical History  Diagnosis Date  . 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 He had markedly elevated LV filling pressures and pulmonary venous hypertension with normal pulmonary vascular resistance suggesting he will normalize his pulmonary pressures with adequate diuresis.   . Renal insufficiency   . Sleep apnea   . Morbid obesity   . A-fib   . Chronic diastolic heart failure     Echo 8.25.2011:  Mod LVH; EF 50%; Mod LAE; RV dilation and ? dysfxn; mild RAE;   . Other primary cardiomyopathies   . Dyslipidemia   . Essential hypertension, benign   . Diabetes mellitus without mention of complication   . Atrial fibrillation 08/26/2009    Annotation: Permanent Not a coumadin candidate secondary to noncompliance Qualifier: Diagnosis of  By: Huntley DecWeaver PA-C, Scott    . Chronic systolic heart failure 08/26/2009      Qualifier: Diagnosis of  By: Huntley DecWeaver PA-C, Scott     . CORONARY ARTERY DISEASE, S/P PTCA 10/08/2009    Qualifier: Diagnosis of  By: Trevor IhaSchub, RN, Heather    . Acute and chronic respiratory failure (acute-on-chronic) 02/07/2013  . HYPERTHYROIDISM 02/23/2011    Qualifier: Diagnosis of  By: Daphine DeutscherMartin FNP, Zena AmosNykedtra    . BPH (benign prostatic hyperplasia) 07/23/2013  . Depression 03/13/2013  . Chronic pain 03/13/2013  . Hyperkalemia 02/07/2013  . SLEEP APNEA 08/26/2009    Annotation: diagnosed by overnight oximetry in hospital in Aug. 2010 does not want to wear CPAP Qualifier: Diagnosis of  By: Brynda RimWeaver PA-C, Scott      Past Surgical History  Procedure Laterality Date  . None      Family History  Problem Relation Age of Onset  . Hypertension  Aunts    Social History:  reports that he has never smoked. He does  not have any smokeless tobacco history on file. He reports that he does not drink alcohol or use illicit drugs.  Allergies: No Known Allergies  Review of Systems:  CARDIOLOGY:  he has a  history of high blood pressure.  Also apparently has had CHF but recently asymptomatic. History of atrial fibrillation     ENDOCRINOLOGY:  he has had long-standing diabetes treated with insulin and followed by PCP, A1c just over 7% usually He is being treated by his PCP with Lantus and metformin  He has renal insufficiency  He says he has had significant peripheral neuropathy in his legs causing weakness and is using a wheelchair to ambulate.            Examination:   BP 92/55  Pulse 58  Temp(Src) 98.2 F (36.8 C)  Resp 16  Wt 316 lb 9.6 oz (143.609 kg)  SpO2 97%   General Appearance:  he has generalized obesity. He looks well  Eyes: No prominence or swelling Neck: The thyroid is not palpable on the right side. Has a  palpable firm left lobe enlargement which is slightly nodular and about 1-1/2 times normal, mostly felt on swallowing on the left side Neurological: REFLEXES: At biceps appear normal.  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 on the left side, treated with I-131 Although clinically he looks euthyroid it is difficult to judge his symptoms He does appear to have a progressively smaller nodule on exam after his radioactive iodine treatment  He may remain euthyroid since he had toxic nodule  To have labs checked today and decide on further treatment  [name] 08/08/2014, 9:24 PM   Addendum: Free T4 again  normal indicating successful treatment of a toxic nodule, will need to continue follow up in 2 months since free T4 is slightly lower but continued low TSH   Office Visit on 08/08/2014  Component Date Value Ref Range Status  . TSH 08/08/2014 0.02* 0.35 - 4.50 uIU/mL Final  . Free T4 08/08/2014 0.81  0.60 - 1.60 ng/dL Final    Nursing Home on 08/02/2014  Component Date Value Ref Range Status  . Glucose 07/10/2014 148   Final  . BUN 07/10/2014 23* 4 - 21 mg/dL Final  . Creatinine 01/41/0301 1.2  0.6 - 1.3 mg/dL Final  . Potassium 31/43/8887 4.2  3.4 - 5.3 mmol/L Final  . Sodium 07/10/2014 137  137 - 147 mmol/L Final  . Alkaline Phosphatase 06/28/2014 45  25 - 125 U/L Final  . ALT 06/28/2014 9* 10 - 40 U/L Final  . AST 06/28/2014 11* 14 - 40 U/L Final  . Bilirubin, Direct 06/28/2014 0.1  .01 - .4 mg/dL Final  . Bilirubin, Total 06/28/2014 0.3   Final  . Hemoglobin A1C 07/10/2014 7.5* 4.0 - 6.0 % Final

## 2014-08-09 NOTE — Progress Notes (Signed)
Quick Note:  Please let patient know that the thyroid result is normal, followup in 2 months as scheduled ______

## 2014-08-11 MED ORDER — DIVALPROEX SODIUM 125 MG PO CPSP: ORAL_CAPSULE | ORAL | Status: AC

## 2014-08-11 MED ORDER — GABAPENTIN 300 MG PO CAPS: ORAL_CAPSULE | ORAL | Status: DC

## 2014-09-25 NOTE — Pre-Procedure Instructions (Signed)
[name]  09/25/2014   Your procedure is scheduled on:  Mon, Oct 12 @ 10:30 AM  Report to [facility]  at 8:30 AM.  Call this number if you have problems the morning of surgery: [phone]   Remember:   Do not eat food or drink liquids after midnight.   Take these medicines the morning of surgery with A SIP OF WATER: Clonidine(Catapres),Digoxin(Lanoxin),Proscar(Finasteride),Gabapentin(Nurontin),Nasal Spray(if needed),and Flomax(Tamsulosin)             Stop taking your Pradaxa as you have been instructed. No Goody's,BC's,Aleve,Aspirin,Ibuprofen,Fish Oil,or any Herbal Medications   Do not wear jewelry  Do not wear lotions, powders, or colognes. You may wear deodorant.  Men may shave face and neck.  Do not bring valuables to the hospital.  [facility] is not responsible                  for any belongings or valuables.               Contacts, dentures or bridgework may not be worn into surgery.  Leave suitcase in the car. After surgery it may be brought to your room.  For patients admitted to the hospital, discharge time is determined by your                treatment team.               Patients discharged the day of surgery will not be allowed to drive  home.    Special Instructions:  [facility] - Preparing for Surgery  Before surgery, you can play an important role.  Because skin is not sterile, your skin needs to be as free of germs as possible.  You can reduce the number of germs on you skin by washing with CHG (chlorahexidine gluconate) soap before surgery.  CHG is an antiseptic cleaner which kills germs and bonds with the skin to continue killing germs even after washing.  Please DO NOT use if you have an allergy to CHG or antibacterial soaps.  If your skin becomes reddened/irritated stop using the CHG and inform your nurse when you arrive at Short Stay.  Do not shave (including legs and underarms) for at least 48 hours prior to the first CHG shower.  You may  shave your face.  Please follow these instructions carefully:   1.  Shower with CHG Soap the night before surgery and the                                morning of Surgery.  2.  If you choose to wash your hair, wash your hair first as usual with your       normal shampoo.  3.  After you shampoo, rinse your hair and body thoroughly to remove the                      Shampoo.  4.  Use CHG as you would any other liquid soap.  You can apply chg directly       to the skin and wash gently with scrungie or a clean washcloth.  5.  Apply the CHG Soap to your body ONLY FROM THE NECK DOWN.        Do not use on open wounds or open sores.  Avoid contact with your eyes,       ears, mouth and genitals (private  parts).  Wash genitals (private parts)       with your normal soap.  6.  Wash thoroughly, paying special attention to the area where your surgery        will be performed.  7.  Thoroughly rinse your body with warm water from the neck down.  8.  DO NOT shower/wash with your normal soap after using and rinsing off       the CHG Soap.  9.  Pat yourself dry with a clean towel.            10.  Wear clean pajamas.            11.  Place clean sheets on your bed the night of your first shower and do not        sleep with pets.  Day of Surgery  Do not apply any lotions/deoderants the morning of surgery.  Please wear clean clothes to the hospital/surgery center.     Please read over the following fact sheets that you were given: Pain Booklet, Coughing and Deep Breathing and Surgical Site Infection Prevention

## 2014-09-25 NOTE — H&P (Signed)
HISTORY AND PHYSICAL  [name] is a 63 y.o. male patient with CC: painful teeth  No diagnosis found.  Past Medical History  Diagnosis Date  . 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 He had markedly elevated LV filling pressures and pulmonary venous hypertension with normal pulmonary vascular resistance suggesting he will normalize his pulmonary pressures with adequate diuresis.   . Renal insufficiency   . Sleep apnea   . Morbid obesity   . A-fib   . Chronic diastolic heart failure     Echo 8.25.2011:  Mod LVH; EF 50%; Mod LAE; RV dilation and ? dysfxn; mild RAE;   . Other primary cardiomyopathies   . Dyslipidemia   . Essential hypertension, benign   . Diabetes mellitus without mention of complication   . Atrial fibrillation 08/26/2009    Annotation: Permanent Not a coumadin candidate secondary to noncompliance Qualifier: Diagnosis of  By: Huntley Dec, Tena Linebaugh    . Chronic systolic heart failure 08/26/2009      Qualifier: Diagnosis of  By: Huntley Dec, Abriel Geesey     . CORONARY ARTERY DISEASE, S/P PTCA 10/08/2009    Qualifier: Diagnosis of  By: Trevor Iha, RN, Heather    . Acute and chronic respiratory failure (acute-on-chronic) 02/07/2013  . HYPERTHYROIDISM 02/23/2011    Qualifier: Diagnosis of  By: Daphine Deutscher FNP, Zena Amos    . BPH (benign prostatic hyperplasia) 07/23/2013  . Depression 03/13/2013  . Chronic pain 03/13/2013  . Hyperkalemia 02/07/2013  . SLEEP APNEA 08/26/2009    Annotation: diagnosed by overnight oximetry in hospital in Aug. 2010 does not want to wear CPAP Qualifier: Diagnosis of  By: Huntley Dec, Ersel Wadleigh      No current facility-administered medications for this encounter.   Current Outpatient Prescriptions  Medication Sig Dispense Refill  . acetaminophen (TYLENOL) 325 MG tablet 2 by mouth every 4 hours for headache or fever > 100, if fever 101.5 contact MD. Do not exceed  4 gm of tylenol in 24 hours      . Cholecalciferol 1000 UNITS tablet Take 1,000 Units by mouth daily.      . cloNIDine (CATAPRES) 0.2 MG tablet Take 0.2 mg by mouth 2 (two) times daily. For CHF      . dabigatran (PRADAXA) 150 MG CAPS Take 1 capsule (150 mg total) by mouth every 12 (twelve) hours.  60 capsule  3  . digoxin (LANOXIN) 0.125 MG tablet Take 0.125 mg by mouth daily.      . divalproex (DEPAKOTE SPRINKLE) 125 MG capsule Take 1 by mouth every night at bedtime to stabilize depressive mood      . finasteride (PROSCAR) 5 MG tablet Take 5 mg by mouth daily. For BPH      . furosemide (LASIX) 40 MG tablet Take 60 mg by mouth 2 (two) times daily.       Marland Kitchen gabapentin (NEURONTIN) 300 MG capsule Take 300 mg by mouth daily.      . insulin glargine (LANTUS) 100 UNIT/ML injection Inject 25 Units into the skin at bedtime.       . lidocaine (LIDODERM) 5 % 0.05 patches. 1/2 patch topically on both shins daily and discard patch within 12 hours for Osteoarthritis      . lisinopril (PRINIVIL,ZESTRIL) 40 MG tablet Take 40 mg by mouth daily. For hypertension      . metFORMIN (GLUCOPHAGE) 1000 MG tablet Take 1,000 mg by mouth 2 (two) times  daily with a meal. For diabetes      . potassium chloride SA (K-DUR,KLOR-CON) 20 MEQ tablet Take 40 mEq by mouth 2 (two) times daily. For Hypopotassemic      . sodium chloride (OCEAN) 0.65 % nasal spray Place 1 spray into the nose 2 (two) times daily. For dry nares      . spironolactone (ALDACTONE) 25 MG tablet Take 25 mg by mouth daily. For hypertension      . tamsulosin (FLOMAX) 0.4 MG CAPS capsule Take 0.4 mg by mouth daily. For BPH       No Known Allergies Active Problems:   * No active hospital problems. *  Vitals: There were no vitals taken for this visit. Lab results:No results found for this or any previous visit (from the past 24 hour(s)). Radiology Results: No results found. General appearance: alert, cooperative and morbidly obese Head: Normocephalic, without  obvious abnormality, atraumatic Eyes: negative Nose: Nares normal. Septum midline. Mucosa normal. No drainage or sinus tenderness. Throat: dental caries, periodontitis teeth#'s 2, 3, 6, 7, 8, 9, 10, 11, 12, 13, 14, 15, 16, 19, 20, 21, 22, 28, 30, 31 Neck: no adenopathy, supple, symmetrical, trachea midline and thyroid not enlarged, symmetric, no tenderness/mass/nodules Resp: clear to auscultation bilaterally Cardio: regular rate and rhythm, S1, S2 normal, no murmur, click, rub or gallop  Assessment: Non-restorable teeth#'s 2, 3, 6, 7, 8, 9, 10, 11, 12, 13, 14, 15, 16, 19, 20, 21, 22, 28, 30, 31.  Plan: extraction teeth#'s 2, 3, 6, 7, 8, 9, 10, 11, 12, 13, 14, 15, 16, 19, 20, 21, 22, 28, 30, 31. Alveoloplasty. General anesthesia. Day surgery or 23 h observation.   Lulabelle Desta M 09/25/2014

## 2014-09-26 ENCOUNTER — Encounter (HOSPITAL_COMMUNITY): Payer: Self-pay

## 2014-09-26 ENCOUNTER — Ambulatory Visit (HOSPITAL_COMMUNITY)
Admission: RE | Admit: 2014-09-26 | Discharge: 2014-09-26 | Disposition: A | Discharge status: Home / Self Care | Payer: Medicaid Other | Source: Ambulatory Visit | Attending: Anesthesiology | Admitting: Anesthesiology

## 2014-09-26 ENCOUNTER — Encounter (HOSPITAL_COMMUNITY)
Admission: RE | Admit: 2014-09-26 | Discharge: 2014-09-26 | Disposition: A | Discharge status: Home / Self Care | Payer: Medicaid Other | Source: Ambulatory Visit | Attending: Oral Surgery | Admitting: Oral Surgery

## 2014-09-26 DIAGNOSIS — J9620 Acute and chronic respiratory failure, unspecified whether with hypoxia or hypercapnia: Secondary | ICD-10-CM | POA: Diagnosis present

## 2014-09-26 DIAGNOSIS — I509 Heart failure, unspecified: Secondary | ICD-10-CM

## 2014-09-26 DIAGNOSIS — I10 Essential (primary) hypertension: Secondary | ICD-10-CM | POA: Diagnosis not present

## 2014-09-26 DIAGNOSIS — G4730 Sleep apnea, unspecified: Secondary | ICD-10-CM | POA: Diagnosis not present

## 2014-09-26 HISTORY — DX: Polyneuropathy, unspecified: G62.9

## 2014-09-26 HISTORY — DX: Urgency of urination: R39.15

## 2014-09-26 HISTORY — DX: Frequency of micturition: R35.0

## 2014-09-26 HISTORY — DX: Benign prostatic hyperplasia without lower urinary tract symptoms: N40.0

## 2014-09-26 HISTORY — DX: Pain in unspecified joint: M25.50

## 2014-09-26 LAB — SURGICAL PCR SCREEN
MRSA, PCR: NEGATIVE
Staphylococcus aureus: NEGATIVE

## 2014-09-26 LAB — BASIC METABOLIC PANEL
Anion gap: 13 (ref 5–15)
BUN: 31 mg/dL — AB (ref 6–23)
CHLORIDE: 102 meq/L (ref 96–112)
CO2: 24 meq/L (ref 19–32)
Calcium: 9.4 mg/dL (ref 8.4–10.5)
Creatinine, Ser: 1.32 mg/dL (ref 0.50–1.35)
GFR calc Af Amer: 65 mL/min — ABNORMAL LOW (ref >90)
GFR calc non Af Amer: 56 mL/min — ABNORMAL LOW (ref >90)
GLUCOSE: 132 mg/dL — AB (ref 70–99)
POTASSIUM: 5.6 meq/L — AB (ref 3.7–5.3)
Sodium: 139 mEq/L (ref 137–147)

## 2014-09-26 LAB — CBC
HEMATOCRIT: 38.5 % — AB (ref 39.0–52.0)
HEMOGLOBIN: 12.6 g/dL — AB (ref 13.0–17.0)
MCH: 30.1 pg (ref 26.0–34.0)
MCHC: 32.7 g/dL (ref 30.0–36.0)
MCV: 91.9 fL (ref 78.0–100.0)
Platelets: 185 10*3/uL (ref 150–400)
RBC: 4.19 MIL/uL — AB (ref 4.22–5.81)
RDW: 13.7 % (ref 11.5–15.5)
WBC: 4.7 10*3/uL (ref 4.0–10.5)

## 2014-09-26 NOTE — Progress Notes (Signed)
Average fasting blood sugar runs 100-110

## 2014-09-26 NOTE — Progress Notes (Addendum)
Sees PA with [facility] with last visit in epic from 06/2014  Clearance note in epic from 06/2014  EKG in epic from 06-28-14  Echo reports in epic from 2010/2011/2014  Heart cath in epic from 2010  Sleep study in epic from 2015  Medical Md is Dr.Michael Leanord Hawking

## 2014-09-27 NOTE — Progress Notes (Signed)
Anesthesia Chart Review:  Patient is a 63 year old male scheduled for multiple teeth extractions on 09/30/14 by Dr. Barbette Merino. Total of teeth appears to be ~ 20. Case is posted for [facility].  History includes non-smoker, non-obstructive CAD '10, CHF, chronic diastolic CHF with RHF/severe pulmonary HTN, chronic afib, dyslipidemia, hyperthyroidism d/t toxic left thyroid nodule s/p radioactive iodine therapy 03/11/14, severe OSA (07/06/14 sleep study; Dr. Shelle Iron), BPH, DM2, BPH, HTN, peripheral neuropathy, depression, CRI, morbid obesity (BMI 54). He reported being told to hold Pradaxa two day prior to surgery.  PCP is Dr. Baltazar Najjar.  Endocrinologist is Dr. Reather Littler.  Cardiologist is Dr. Rollene Rotunda.  He was seen at [facility] by Tereso Newcomer, PA-C on 06/28/14 and cleared for this procedure with mild to moderate risk for perioperative complications. He did refer patient to the [facility]F Clinic with appointment scheduled for 10/09/14.   Studies:  - LHC (07/2009): Distal LAD 50%, circumflex 30%, distal circumflex 50-60%, mid RCA 30%, AM 40%  - Echo (02/08/13): Mild LVH, EF 50%, D-shaped interventricular septum, trivial MR, mild LAE, severe RVE, moderately reduced RVSF, moderate RAE, PASP 82-86 mm Hg (severe pulmonary HTN), trivial effusion  EKG on 06/28/14 showed: afib at 52 bpm, right superior axis deviation, incomplete right BBB, RVH, cannot rule out anterior infarct (age undetermined), ST/T wave abnormality, consider inferolateral ischemia.  CXR on 09/26/14 showed: Low-grade compensated CHF. There is no evidence of pulmonary edema currently.  Preoperative labs noted.  K 5.6 (no hemolysis documented).  BUN/Cr 31/1.32.  H/H 12.6/38.5. A1C on 07/10/14 was 7.5.  TSH stable at 0.02 on 08/08/14 and 06/27/14 with normal Free T4 and Free T3 06/27/14. Since his K was > 5.5, I'll go ahead and order an ISTAT for the day of surgery to ensure stable/improved hyperkalemia.    Anesthesiologist Dr. Michelle Piper updated  on history of morbid obesity, severe pulmonary HTN, and severe OSA.  Can hopefully do case without GA.  Definitive anesthesia plan on the day of surgery following anesthesiology evaluation.  Velna Ochs [facility] Short Stay Center/Anesthesiology Phone [phone] 09/27/2014 1:52 PM

## 2014-09-28 ENCOUNTER — Other Ambulatory Visit: Payer: Self-pay | Admitting: Adult Health

## 2014-09-29 MED ORDER — DEXTROSE 5 % IV SOLN
3.0000 g | INTRAVENOUS | Status: AC
  Administered 2014-09-30: 3 g via INTRAVENOUS
  Filled 2014-09-29: qty 3000

## 2014-09-30 ENCOUNTER — Ambulatory Visit (HOSPITAL_COMMUNITY): Payer: Medicaid Other | Admitting: Anesthesiology

## 2014-09-30 ENCOUNTER — Observation Stay (HOSPITAL_COMMUNITY)
Admission: RE | Admit: 2014-09-30 | Discharge: 2014-10-01 | Disposition: A | Discharge status: Home / Self Care | Payer: Medicaid Other | Source: Ambulatory Visit | Attending: Oral Surgery | Admitting: Oral Surgery

## 2014-09-30 ENCOUNTER — Encounter (HOSPITAL_COMMUNITY): Payer: Self-pay | Admitting: Anesthesiology

## 2014-09-30 ENCOUNTER — Encounter (HOSPITAL_COMMUNITY)
Admission: RE | Disposition: A | Discharge status: Home / Self Care | Payer: Self-pay | Source: Ambulatory Visit | Attending: Oral Surgery

## 2014-09-30 ENCOUNTER — Encounter (HOSPITAL_COMMUNITY): Payer: Medicaid Other | Admitting: Vascular Surgery

## 2014-09-30 DIAGNOSIS — J9620 Acute and chronic respiratory failure, unspecified whether with hypoxia or hypercapnia: Secondary | ICD-10-CM | POA: Insufficient documentation

## 2014-09-30 DIAGNOSIS — I2510 Atherosclerotic heart disease of native coronary artery without angina pectoris: Secondary | ICD-10-CM

## 2014-09-30 DIAGNOSIS — E0590 Thyrotoxicosis, unspecified without thyrotoxic crisis or storm: Secondary | ICD-10-CM | POA: Diagnosis not present

## 2014-09-30 DIAGNOSIS — I10 Essential (primary) hypertension: Secondary | ICD-10-CM | POA: Insufficient documentation

## 2014-09-30 DIAGNOSIS — I5032 Chronic diastolic (congestive) heart failure: Secondary | ICD-10-CM | POA: Insufficient documentation

## 2014-09-30 DIAGNOSIS — G4730 Sleep apnea, unspecified: Secondary | ICD-10-CM | POA: Diagnosis not present

## 2014-09-30 DIAGNOSIS — N400 Enlarged prostate without lower urinary tract symptoms: Secondary | ICD-10-CM

## 2014-09-30 DIAGNOSIS — G8929 Other chronic pain: Secondary | ICD-10-CM

## 2014-09-30 DIAGNOSIS — Z794 Long term (current) use of insulin: Secondary | ICD-10-CM | POA: Diagnosis not present

## 2014-09-30 DIAGNOSIS — E785 Hyperlipidemia, unspecified: Secondary | ICD-10-CM | POA: Insufficient documentation

## 2014-09-30 DIAGNOSIS — M25519 Pain in unspecified shoulder: Secondary | ICD-10-CM

## 2014-09-30 DIAGNOSIS — I4891 Unspecified atrial fibrillation: Secondary | ICD-10-CM | POA: Insufficient documentation

## 2014-09-30 DIAGNOSIS — I429 Cardiomyopathy, unspecified: Secondary | ICD-10-CM | POA: Diagnosis not present

## 2014-09-30 DIAGNOSIS — F329 Major depressive disorder, single episode, unspecified: Secondary | ICD-10-CM | POA: Insufficient documentation

## 2014-09-30 DIAGNOSIS — I482 Chronic atrial fibrillation, unspecified: Secondary | ICD-10-CM

## 2014-09-30 DIAGNOSIS — G9341 Metabolic encephalopathy: Secondary | ICD-10-CM

## 2014-09-30 DIAGNOSIS — D649 Anemia, unspecified: Secondary | ICD-10-CM

## 2014-09-30 DIAGNOSIS — R001 Bradycardia, unspecified: Secondary | ICD-10-CM

## 2014-09-30 DIAGNOSIS — K088 Other specified disorders of teeth and supporting structures: Secondary | ICD-10-CM | POA: Diagnosis not present

## 2014-09-30 DIAGNOSIS — R05 Cough: Secondary | ICD-10-CM

## 2014-09-30 DIAGNOSIS — J9610 Chronic respiratory failure, unspecified whether with hypoxia or hypercapnia: Secondary | ICD-10-CM

## 2014-09-30 DIAGNOSIS — R059 Cough, unspecified: Secondary | ICD-10-CM

## 2014-09-30 DIAGNOSIS — Z6841 Body Mass Index (BMI) 40.0 and over, adult: Secondary | ICD-10-CM | POA: Insufficient documentation

## 2014-09-30 DIAGNOSIS — K0530 Chronic periodontitis, unspecified: Secondary | ICD-10-CM

## 2014-09-30 DIAGNOSIS — E875 Hyperkalemia: Secondary | ICD-10-CM

## 2014-09-30 DIAGNOSIS — R32 Unspecified urinary incontinence: Secondary | ICD-10-CM

## 2014-09-30 DIAGNOSIS — F32A Depression, unspecified: Secondary | ICD-10-CM

## 2014-09-30 DIAGNOSIS — I25119 Atherosclerotic heart disease of native coronary artery with unspecified angina pectoris: Secondary | ICD-10-CM

## 2014-09-30 DIAGNOSIS — E0510 Thyrotoxicosis with toxic single thyroid nodule without thyrotoxic crisis or storm: Secondary | ICD-10-CM

## 2014-09-30 DIAGNOSIS — K029 Dental caries, unspecified: Secondary | ICD-10-CM

## 2014-09-30 DIAGNOSIS — K5900 Constipation, unspecified: Secondary | ICD-10-CM

## 2014-09-30 DIAGNOSIS — K219 Gastro-esophageal reflux disease without esophagitis: Secondary | ICD-10-CM

## 2014-09-30 DIAGNOSIS — Z9889 Other specified postprocedural states: Secondary | ICD-10-CM

## 2014-09-30 HISTORY — PX: MULTIPLE TOOTH EXTRACTIONS: SHX2053

## 2014-09-30 HISTORY — DX: Dependence on other enabling machines and devices: Z99.89

## 2014-09-30 HISTORY — DX: Type 2 diabetes mellitus without complications: E11.9

## 2014-09-30 HISTORY — PX: MULTIPLE EXTRACTIONS WITH ALVEOLOPLASTY: SHX5342

## 2014-09-30 HISTORY — DX: Obstructive sleep apnea (adult) (pediatric): G47.33

## 2014-09-30 LAB — GLUCOSE, CAPILLARY
GLUCOSE-CAPILLARY: 175 mg/dL — AB (ref 70–99)
Glucose-Capillary: 103 mg/dL — ABNORMAL HIGH (ref 70–99)
Glucose-Capillary: 87 mg/dL (ref 70–99)
Glucose-Capillary: 84 mg/dL (ref 70–99)
Glucose-Capillary: 168 mg/dL — ABNORMAL HIGH (ref 70–99)
Glucose-Capillary: 143 mg/dL — ABNORMAL HIGH (ref 70–99)
Glucose-Capillary: 170 mg/dL — ABNORMAL HIGH (ref 70–99)

## 2014-09-30 LAB — POCT I-STAT, CHEM 8
BUN: 28 mg/dL — ABNORMAL HIGH (ref 6–23)
CALCIUM ION: 1.15 mmol/L (ref 1.13–1.30)
CHLORIDE: 106 meq/L (ref 96–112)
CREATININE: 1.3 mg/dL (ref 0.50–1.35)
GLUCOSE: 102 mg/dL — AB (ref 70–99)
HCT: 46 % (ref 39.0–52.0)
Hemoglobin: 15.6 g/dL (ref 13.0–17.0)
Potassium: 4.5 mEq/L (ref 3.7–5.3)
Sodium: 140 mEq/L (ref 137–147)
TCO2: 22 mmol/L (ref 0–100)

## 2014-09-30 SURGERY — MULTIPLE EXTRACTION WITH ALVEOLOPLASTY
Anesthesia: General | Site: Mouth

## 2014-09-30 MED ORDER — POTASSIUM CHLORIDE CRYS ER 20 MEQ PO TBCR
40.0000 meq | EXTENDED_RELEASE_TABLET | Freq: Two times a day (BID) | ORAL | Status: DC
  Administered 2014-09-30 – 2014-10-01 (×2): 40 meq via ORAL
  Filled 2014-09-30 (×3): qty 2

## 2014-09-30 MED ORDER — INSULIN ASPART 100 UNIT/ML ~~LOC~~ SOLN
0.0000 [IU] | Freq: Three times a day (TID) | SUBCUTANEOUS | Status: DC
  Administered 2014-09-30: 2 [IU] via SUBCUTANEOUS

## 2014-09-30 MED ORDER — ONDANSETRON HCL 4 MG/2ML IJ SOLN
INTRAMUSCULAR | Status: DC | PRN
  Administered 2014-09-30: 4 mg via INTRAVENOUS

## 2014-09-30 MED ORDER — PROPOFOL 10 MG/ML IV BOLUS
INTRAVENOUS | Status: AC
  Filled 2014-09-30: qty 20

## 2014-09-30 MED ORDER — SODIUM CHLORIDE 0.45 % IV SOLN
INTRAVENOUS | Status: DC
  Administered 2014-09-30: 15:00:00 via INTRAVENOUS

## 2014-09-30 MED ORDER — AMOXICILLIN 500 MG PO CAPS: 500.0000 mg | ORAL_CAPSULE | Freq: Three times a day (TID) | ORAL | Status: DC

## 2014-09-30 MED ORDER — FINASTERIDE 5 MG PO TABS
5.0000 mg | ORAL_TABLET | Freq: Every day | ORAL | Status: DC
  Administered 2014-09-30 – 2014-10-01 (×2): 5 mg via ORAL
  Filled 2014-09-30 (×2): qty 1

## 2014-09-30 MED ORDER — INSULIN ASPART 100 UNIT/ML ~~LOC~~ SOLN: 0.0000 [IU] | Freq: Every day | SUBCUTANEOUS | Status: DC

## 2014-09-30 MED ORDER — INFLUENZA VAC SPLIT QUAD 0.5 ML IM SUSY
0.5000 mL | PREFILLED_SYRINGE | INTRAMUSCULAR | Status: AC
  Administered 2014-10-01: 0.5 mL via INTRAMUSCULAR
  Filled 2014-09-30: qty 0.5

## 2014-09-30 MED ORDER — INSULIN GLARGINE 100 UNIT/ML ~~LOC~~ SOLN
25.0000 [IU] | Freq: Every day | SUBCUTANEOUS | Status: DC
  Administered 2014-09-30: 25 [IU] via SUBCUTANEOUS
  Filled 2014-09-30 (×2): qty 0.25

## 2014-09-30 MED ORDER — NEOSTIGMINE METHYLSULFATE 10 MG/10ML IV SOLN
INTRAVENOUS | Status: DC | PRN
  Administered 2014-09-30: 5 mg via INTRAVENOUS

## 2014-09-30 MED ORDER — ONDANSETRON HCL 4 MG/2ML IJ SOLN
4.0000 mg | Freq: Four times a day (QID) | INTRAMUSCULAR | Status: DC | PRN
  Filled 2014-09-30: qty 2

## 2014-09-30 MED ORDER — LIDOCAINE-EPINEPHRINE 2 %-1:100000 IJ SOLN
INTRAMUSCULAR | Status: AC
  Filled 2014-09-30: qty 1

## 2014-09-30 MED ORDER — LIDOCAINE-EPINEPHRINE 2 %-1:100000 IJ SOLN
INTRAMUSCULAR | Status: DC | PRN
  Administered 2014-09-30: 30 mL

## 2014-09-30 MED ORDER — TAMSULOSIN HCL 0.4 MG PO CAPS
0.4000 mg | ORAL_CAPSULE | Freq: Every day | ORAL | Status: DC
  Administered 2014-09-30: 0.4 mg via ORAL
  Filled 2014-09-30 (×2): qty 1

## 2014-09-30 MED ORDER — GLYCOPYRROLATE 0.2 MG/ML IJ SOLN
INTRAMUSCULAR | Status: DC | PRN
  Administered 2014-09-30: .8 mg via INTRAVENOUS

## 2014-09-30 MED ORDER — SODIUM CHLORIDE 0.9 % IV SOLN
INTRAVENOUS | Status: DC
  Administered 2014-09-30 (×2): via INTRAVENOUS

## 2014-09-30 MED ORDER — OXYCODONE-ACETAMINOPHEN 5-325 MG PO TABS: 1.0000 | ORAL_TABLET | ORAL | Status: AC | PRN

## 2014-09-30 MED ORDER — FUROSEMIDE 40 MG PO TABS
40.0000 mg | ORAL_TABLET | Freq: Two times a day (BID) | ORAL | Status: DC
  Administered 2014-09-30 – 2014-10-01 (×2): 40 mg via ORAL
  Filled 2014-09-30 (×4): qty 1

## 2014-09-30 MED ORDER — SODIUM CHLORIDE 0.9 % IR SOLN
Status: DC | PRN
  Administered 2014-09-30: 1000 mL

## 2014-09-30 MED ORDER — LIDOCAINE HCL (CARDIAC) 20 MG/ML IV SOLN
INTRAVENOUS | Status: DC | PRN
  Administered 2014-09-30: 60 mg via INTRAVENOUS

## 2014-09-30 MED ORDER — OXYCODONE HCL 5 MG PO TABS
5.0000 mg | ORAL_TABLET | ORAL | Status: DC | PRN
  Administered 2014-09-30 – 2014-10-01 (×2): 10 mg via ORAL
  Filled 2014-09-30 (×2): qty 2

## 2014-09-30 MED ORDER — LABETALOL HCL 5 MG/ML IV SOLN
INTRAVENOUS | Status: AC
  Filled 2014-09-30: qty 4

## 2014-09-30 MED ORDER — CLONIDINE HCL 0.2 MG PO TABS
0.2000 mg | ORAL_TABLET | Freq: Two times a day (BID) | ORAL | Status: DC
  Administered 2014-09-30 – 2014-10-01 (×2): 0.2 mg via ORAL
  Filled 2014-09-30 (×3): qty 1

## 2014-09-30 MED ORDER — DIPHENHYDRAMINE HCL 50 MG/ML IJ SOLN
12.5000 mg | Freq: Four times a day (QID) | INTRAMUSCULAR | Status: DC | PRN
  Filled 2014-09-30: qty 0.5

## 2014-09-30 MED ORDER — LABETALOL HCL 5 MG/ML IV SOLN
5.0000 mg | INTRAVENOUS | Status: DC | PRN
  Administered 2014-09-30: 5 mg via INTRAVENOUS

## 2014-09-30 MED ORDER — GABAPENTIN 300 MG PO CAPS
300.0000 mg | ORAL_CAPSULE | Freq: Every day | ORAL | Status: DC
  Administered 2014-10-01: 300 mg via ORAL
  Filled 2014-09-30: qty 1

## 2014-09-30 MED ORDER — METFORMIN HCL 500 MG PO TABS
1000.0000 mg | ORAL_TABLET | Freq: Two times a day (BID) | ORAL | Status: DC
  Administered 2014-10-01: 1000 mg via ORAL
  Filled 2014-09-30 (×4): qty 2

## 2014-09-30 MED ORDER — HYDROMORPHONE HCL 1 MG/ML IJ SOLN
0.5000 mg | INTRAMUSCULAR | Status: DC | PRN
  Administered 2014-09-30: 0.5 mg via INTRAVENOUS
  Filled 2014-09-30: qty 1

## 2014-09-30 MED ORDER — DIVALPROEX SODIUM 125 MG PO CPSP
125.0000 mg | ORAL_CAPSULE | Freq: Every day | ORAL | Status: DC
  Administered 2014-09-30: 125 mg via ORAL
  Filled 2014-09-30 (×2): qty 1

## 2014-09-30 MED ORDER — DIGOXIN 125 MCG PO TABS
0.1250 mg | ORAL_TABLET | Freq: Every day | ORAL | Status: DC
  Administered 2014-10-01: 0.125 mg via ORAL
  Filled 2014-09-30: qty 1

## 2014-09-30 MED ORDER — PNEUMOCOCCAL VAC POLYVALENT 25 MCG/0.5ML IJ INJ
0.5000 mL | INJECTION | INTRAMUSCULAR | Status: AC
  Administered 2014-10-01: 0.5 mL via INTRAMUSCULAR
  Filled 2014-09-30: qty 0.5

## 2014-09-30 MED ORDER — DIPHENHYDRAMINE HCL 12.5 MG/5ML PO ELIX
12.5000 mg | ORAL_SOLUTION | Freq: Four times a day (QID) | ORAL | Status: DC | PRN
  Filled 2014-09-30: qty 10

## 2014-09-30 MED ORDER — PHENYLEPHRINE HCL 10 MG/ML IJ SOLN
10.0000 mg | INTRAVENOUS | Status: DC | PRN
  Administered 2014-09-30: 60 ug/min via INTRAVENOUS

## 2014-09-30 MED ORDER — FENTANYL CITRATE 0.05 MG/ML IJ SOLN
INTRAMUSCULAR | Status: AC
  Filled 2014-09-30: qty 5

## 2014-09-30 MED ORDER — LISINOPRIL 40 MG PO TABS
40.0000 mg | ORAL_TABLET | Freq: Every day | ORAL | Status: DC
  Administered 2014-09-30 – 2014-10-01 (×2): 40 mg via ORAL
  Filled 2014-09-30 (×2): qty 1
  Filled 2014-09-30: qty 2

## 2014-09-30 MED ORDER — PROPOFOL 10 MG/ML IV BOLUS
INTRAVENOUS | Status: DC | PRN
  Administered 2014-09-30: 300 mg via INTRAVENOUS

## 2014-09-30 MED ORDER — SPIRONOLACTONE 25 MG PO TABS
25.0000 mg | ORAL_TABLET | Freq: Every day | ORAL | Status: DC
  Administered 2014-10-01: 25 mg via ORAL
  Filled 2014-09-30: qty 1

## 2014-09-30 MED ORDER — 0.9 % SODIUM CHLORIDE (POUR BTL) OPTIME
TOPICAL | Status: DC | PRN
  Administered 2014-09-30: 1000 mL

## 2014-09-30 MED ORDER — CEFAZOLIN SODIUM-DEXTROSE 2-3 GM-% IV SOLR
2.0000 g | Freq: Three times a day (TID) | INTRAVENOUS | Status: DC
  Administered 2014-09-30 – 2014-10-01 (×3): 2 g via INTRAVENOUS
  Filled 2014-09-30 (×5): qty 50

## 2014-09-30 MED ORDER — ROCURONIUM BROMIDE 100 MG/10ML IV SOLN
INTRAVENOUS | Status: DC | PRN
  Administered 2014-09-30 (×2): 10 mg via INTRAVENOUS

## 2014-09-30 MED ORDER — FENTANYL CITRATE 0.05 MG/ML IJ SOLN
INTRAMUSCULAR | Status: DC | PRN
  Administered 2014-09-30 (×3): 50 ug via INTRAVENOUS
  Administered 2014-09-30: 100 ug via INTRAVENOUS

## 2014-09-30 MED ORDER — SUCCINYLCHOLINE CHLORIDE 20 MG/ML IJ SOLN
INTRAMUSCULAR | Status: DC | PRN
  Administered 2014-09-30: 110 mg via INTRAVENOUS

## 2014-09-30 SURGICAL SUPPLY — 29 items
BUR CROSS CUT FISSURE 1.6 (BURR) ×2 IMPLANT
BUR CROSS CUT FISSURE 1.6MM (BURR) ×1
BUR EGG ELITE 4.0 (BURR) ×2 IMPLANT
BUR EGG ELITE 4.0MM (BURR) ×1
CANISTER SUCTION 2500CC (MISCELLANEOUS) ×3 IMPLANT
COVER SURGICAL LIGHT HANDLE (MISCELLANEOUS) ×3 IMPLANT
CRADLE DONUT ADULT HEAD (MISCELLANEOUS) ×3 IMPLANT
GAUZE PACKING FOLDED 2  STR (GAUZE/BANDAGES/DRESSINGS) ×2
GAUZE PACKING FOLDED 2 STR (GAUZE/BANDAGES/DRESSINGS) ×1 IMPLANT
GLOVE BIO SURGEON STRL SZ 6.5 (GLOVE) ×4 IMPLANT
GLOVE BIO SURGEON STRL SZ7.5 (GLOVE) ×3 IMPLANT
GLOVE BIO SURGEONS STRL SZ 6.5 (GLOVE) ×2
GLOVE BIOGEL PI IND STRL 7.0 (GLOVE) ×2 IMPLANT
GLOVE BIOGEL PI INDICATOR 7.0 (GLOVE) ×4
GOWN STRL REUS W/ TWL LRG LVL3 (GOWN DISPOSABLE) ×2 IMPLANT
GOWN STRL REUS W/ TWL XL LVL3 (GOWN DISPOSABLE) ×1 IMPLANT
GOWN STRL REUS W/TWL LRG LVL3 (GOWN DISPOSABLE) ×4
GOWN STRL REUS W/TWL XL LVL3 (GOWN DISPOSABLE) ×2
KIT BASIN OR (CUSTOM PROCEDURE TRAY) ×3 IMPLANT
KIT ROOM TURNOVER OR (KITS) ×3 IMPLANT
NEEDLE 22X1 1/2 (OR ONLY) (NEEDLE) ×3 IMPLANT
NS IRRIG 1000ML POUR BTL (IV SOLUTION) ×3 IMPLANT
PAD ARMBOARD 7.5X6 YLW CONV (MISCELLANEOUS) ×6 IMPLANT
SUT CHROMIC 3 0 PS 2 (SUTURE) ×6 IMPLANT
SYR CONTROL 10ML LL (SYRINGE) ×3 IMPLANT
TOWEL OR 17X26 10 PK STRL BLUE (TOWEL DISPOSABLE) ×3 IMPLANT
TRAY ENT MC OR (CUSTOM PROCEDURE TRAY) ×3 IMPLANT
TUBING IRRIGATION (MISCELLANEOUS) ×3 IMPLANT
YANKAUER SUCT BULB TIP NO VENT (SUCTIONS) ×3 IMPLANT

## 2014-09-30 NOTE — Progress Notes (Signed)
Patient is alert and oriented X 4, but is a poor historian when it comes to last doses on his medications. Patient was not certain about exactly which medications he took this morning, but stated "I ran out of six of my medicines on Saturday and they wouldn't refill it until today." Caregiver at stretcher side x 1. Will continue to closely monitor.

## 2014-09-30 NOTE — Discharge Instructions (Signed)
What to Eat after Tooth extraction:   ° ° °For your first meals, you should eat lightly; only small meals at first.   Avoid Sharp, Crunchy, and Hot foods.   If you do not have nausea, you may eat larger meals.  Avoid spicy, greasy and heavy food, as these may make you sick after the anesthesia.  °. ° °Sinus precautions after surgery:  °1. Avoid blowing your nose.  °2. Avoid sneezing. If you might sneeze, Keep her mouth open and do not pinch your nose closed.  °3. Avoid sucking on straws. Avoid smoking.  °4. Do not lift objects weighing more than 20 pounds  °Call if questions arise.  ° °MOUTH CARE AFTER SURGERY  °FACTS:  °Ice used in ice bag helps keep the swelling down, and can help lessen the pain.  °It is easier to treat pain BEFORE it happens.  °Spitting disturbs the clot and may cause bleeding to start again, or to get worse.  °Smoking delays healing and can cause complications.  °Sharing prescriptions can be dangerous. Do not take medications not recently prescribed for you.  °Antibiotics may stop birth control pills from working. Use other means of birth control while on antibiotics.  °Warm salt water rinses after the first 24 hours will help lessen the swelling: Use 1/2 teaspoonful of table salt per oz.of water. °DO NOT:  °Do not spit. Do not drink through a straw.  °Strongly advised not to smoke, dip snuff or chew tobacco at least for 3 days.  °Do not eat sharp or crunchy foods. Avoid the area of surgery when chewing.  °Do not stop your antibiotics before your instructions say to do so.  °Do not eat hot foods until bleeding has stopped. If you need to, let your food cool down to room temperature. °EXPECT:  °Some swelling, especially first 2-3 days.  °Soreness or discomfort in varying degrees. Follow your dentist's instructions about how to handle pain before it starts.  °Pinkish saliva or light blood in saliva, or on your pillow in the morning. This can last around 24 hours.  °Bruising inside or outside the  mouth. This may not show up until 2-3 days after surgery. Don't worry, it will go away in time.  °Pieces of "bone" may work themselves loose. It's OK. If they bother you, let us know. °WHAT TO DO IMMEDIATELY AFTER SURGERY:  °Bite on the gauze with steady pressure for 1-2 hours. Don't chew on the gauze.  °Do not lie down flat. Raise your head support especially for the first 24 hours.  °Apply ice to your face on the side of the surgery. You may apply it 20 minutes on and a few minutes off. Ice for 8-12 hours. You may use ice up to 24 hours.  °Before the numbness wears off, take a pain pill as instructed.  °Prescription pain medication is not always required. °SWELLING:  °Expect swelling for the first couple of days. It should get better after that.  °If swelling increases 3 days or so after surgery; let us know as soon as possible. °FEVER:  °Take Tylenol every 4 hours if needed to lower your temperature, especially if it is at 100F or higher.  °Drink lots of fluids.  °If the fever does not go away, let us know. °BREATHING TROUBLE:  °Any unusual difficulty breathing means you have to have someone bring you to the emergency room ASAP °BLEEDING:  °Light oozing is expected for 24 hours or so.  °Prop head up   with pillows  °Avoid spitting  °Do not confuse bright red fresh flowing blood with lots of saliva colored with a little bit of blood.  °If you notice some bleeding, place gauze or a tea bag where it is bleeding and apply CONSTANT pressure by biting down for 1 hour. Avoid talking during this time. Do not remove the gauze or tea bag during this hour to "check" the bleeding.  °If you notice bright RED bleeding FLOWING out of particular area, and filling the floor of your mouth, put a wad of gauze on that area, bite down firmly and constantly. Call us immediately. If we're closed, have someone bring you to the emergency room. °ORAL HYGIENE:  °Brush your teeth as usual after meals and before bedtime.  °Use a soft  toothbrush around the area of surgery.  °DO NOT AVOID BRUSHING. Otherwise bacteria(germs) will grow and may delay healing or encourage infection.  °Since you cannot spit, just gently rinse and let the water flow out of your mouth.  °DO NOT SWISH HARD. °EATING:  °Cool liquids are a good point to start. Increase to soft foods as tolerated. °PRESCRIPTIONS:  °Follow the directions for your prescriptions exactly as written.  °If your doctor gave you a narcotic pain medication, do not drive, operate machinery or drink alcohol when on that medication. °QUESTIONS:  °Call our office during office hours ° ° °

## 2014-09-30 NOTE — Anesthesia Procedure Notes (Signed)
Procedure Name: Intubation Date/Time: 09/30/2014 11:05 AM Performed by: Darcey Nora B Pre-anesthesia Checklist: Patient identified, Emergency Drugs available, Suction available and Patient being monitored Patient Re-evaluated:Patient Re-evaluated prior to inductionOxygen Delivery Method: Circle system utilized Preoxygenation: Pre-oxygenation with 100% oxygen Intubation Type: IV induction Ventilation: Mask ventilation without difficulty Laryngoscope Size: Mac and 4 Grade View: Grade II Nasal Tubes: Right Tube size: 7.5 mm Number of attempts: 1 Intubation method: red rubber catheter pull-through of Nasal RAE then DVL , guidance via cords with Magill Forceps; pharynx sx for some fresh blood. Placement Confirmation: ETT inserted through vocal cords under direct vision,  breath sounds checked- equal and bilateral and positive ETCO2 ETT to lip (cm): taped acrosds cheeks. Tube secured with: Tape Dental Injury: Teeth and Oropharynx as per pre-operative assessment

## 2014-09-30 NOTE — Anesthesia Postprocedure Evaluation (Signed)
  Anesthesia Post-op Note  Patient: [name]  Procedure(s) Performed: Procedure(s): MULTIPLE EXTRACION WITH ALVEOLOPLASTY (N/A)  Patient Location: PACU  Anesthesia Type:General  Level of Consciousness: awake, alert , oriented and patient cooperative  Airway and Oxygen Therapy: Patient Spontanous Breathing  Post-op Pain: none  Post-op Assessment: Post-op Vital signs reviewed, Patient's Cardiovascular Status Stable, Respiratory Function Stable, Patent Airway, No signs of Nausea or vomiting and Pain level controlled  Post-op Vital Signs: stable  Last Vitals:  Filed Vitals:   09/30/14 1344  BP: 171/95  Pulse: 43  Temp: 36.4 C  Resp:     Complications: No apparent anesthesia complications

## 2014-09-30 NOTE — Progress Notes (Signed)
Notified Dr. Katrinka Blazing of BP and HR. Labetalol order received. Will treat and cont to monitor

## 2014-09-30 NOTE — Anesthesia Preprocedure Evaluation (Addendum)
Anesthesia Evaluation   Patient awake    Reviewed: Allergy & Precautions, H&P , NPO status , Patient's Chart, lab work & pertinent test results  Airway Mallampati: II TM Distance: >3 FB Neck ROM: Full    Dental  (+) Poor Dentition, Dental Advisory Given   Pulmonary sleep apnea ,          Cardiovascular hypertension, Pt. on medications + CAD and +CHF     Neuro/Psych Depression  Neuromuscular disease    GI/Hepatic Controlled,  Endo/Other  diabetes, Type 2, Oral Hypoglycemic AgentsHyperthyroidism Morbid obesity  Renal/GU Renal InsufficiencyRenal disease     Musculoskeletal   Abdominal   Peds  Hematology  (+) anemia ,   Anesthesia Other Findings   Reproductive/Obstetrics                          Anesthesia Physical Anesthesia Plan  ASA: III  Anesthesia Plan: General   Post-op Pain Management:    Induction: Intravenous  Airway Management Planned: Nasal ETT  Additional Equipment:   Intra-op Plan:   Post-operative Plan: Extubation in OR  Informed Consent: I have reviewed the patients History and Physical, chart, labs and discussed the procedure including the risks, benefits and alternatives for the proposed anesthesia with the patient or authorized representative who has indicated his/her understanding and acceptance.     Plan Discussed with: CRNA, Anesthesiologist and Surgeon  Anesthesia Plan Comments:         Anesthesia Quick Evaluation

## 2014-09-30 NOTE — Progress Notes (Signed)
Returning from [place]. Updated by Rosey Bath RN

## 2014-09-30 NOTE — Transfer of Care (Signed)
Immediate Anesthesia Transfer of Care Note  Patient: [name]  Procedure(s) Performed: Procedure(s): MULTIPLE EXTRACION WITH ALVEOLOPLASTY (N/A)  Patient Location: PACU  Anesthesia Type:General  Level of Consciousness: awake and patient cooperative  Airway & Oxygen Therapy: Patient Spontanous Breathing and Patient connected to face mask oxygen  Post-op Assessment: Report given to PACU RN, Post -op Vital signs reviewed and stable and Patient moving all extremities  Post vital signs: Reviewed and stable  Complications: No apparent anesthesia complications

## 2014-09-30 NOTE — H&P (Signed)
H&P documentation  -History and Physical Reviewed  -Patient has been re-examined  -No change in the plan of care  [name] M  

## 2014-09-30 NOTE — Op Note (Signed)
09/30/2014  11:45 AM  PATIENT:  [name]  63 y.o. male  PRE-OPERATIVE DIAGNOSIS:  NON RESTORABLE TEETH # 2, 3, 6, 7, 8, 9, 10, 11,12, 13, 14, 15, 16, 19, 20, 21, 22, 28, 30, 31  POST-OPERATIVE DIAGNOSIS:  SAME  PROCEDURE:  Procedure(s): MULTIPLE EXTRACTION TEETH # 2, 3, 6, 7, 8, 9, 10, 11,12, 13, 14, 15, 16, 19, 20, 21, 22, 28, 30, 31 WITH ALVEOLOPLASTY  SURGEON:  Surgeon(s): [name], DDS  ANESTHESIA:   local and general  EBL:  200cc  DRAINS: none   SPECIMEN:  No Specimen  COUNTS:  YES  PLAN OF CARE: Discharge to home after PACU  PATIENT DISPOSITION:  PACU - hemodynamically stable.   PROCEDURE DETAILS: Dictation # [id number]  [name], DMD 09/30/2014 11:45 AM

## 2014-09-30 NOTE — Progress Notes (Signed)
Called Dr.Smith for sign out--okay to go to room at this time

## 2014-09-30 NOTE — Progress Notes (Signed)
[name] PROGRESS NOTE:   SUBJECTIVE: Some discomfort.   OBJECTIVE:  Vitals: Blood pressure 171/95, pulse 43, temperature 97.5 F (36.4 C), temperature source Oral, resp. rate 22, height 5\' 4"  (1.626 m), weight 144.244 kg (318 lb), SpO2 95.00%. Lab results: Results for orders placed during the hospital encounter of 09/30/14 (from the past 24 hour(s))  GLUCOSE, CAPILLARY     Status: Abnormal   Collection Time    09/30/14  8:41 AM      Result Value Ref Range   Glucose-Capillary 103 (*) 70 - 99 mg/dL  POCT I-STAT, CHEM 8     Status: Abnormal   Collection Time    09/30/14  8:57 AM      Result Value Ref Range   Sodium 140  137 - 147 mEq/L   Potassium 4.5  3.7 - 5.3 mEq/L   Chloride 106  96 - 112 mEq/L   BUN 28 (*) 6 - 23 mg/dL   Creatinine, Ser 5.83  0.50 - 1.35 mg/dL   Glucose, Bld 094 (*) 70 - 99 mg/dL   Calcium, Ion 0.76  8.08 - 1.30 mmol/L   TCO2 22  0 - 100 mmol/L   Hemoglobin 15.6  13.0 - 17.0 g/dL   HCT 81.1  03.1 - 59.4 %  GLUCOSE, CAPILLARY     Status: None   Collection Time    09/30/14  9:48 AM      Result Value Ref Range   Glucose-Capillary 87  70 - 99 mg/dL  GLUCOSE, CAPILLARY     Status: None   Collection Time    09/30/14 10:49 AM      Result Value Ref Range   Glucose-Capillary 84  70 - 99 mg/dL  GLUCOSE, CAPILLARY     Status: Abnormal   Collection Time    09/30/14 12:05 PM      Result Value Ref Range   Glucose-Capillary 168 (*) 70 - 99 mg/dL   Comment 1 Notify RN    GLUCOSE, CAPILLARY     Status: Abnormal   Collection Time    09/30/14  2:06 PM      Result Value Ref Range   Glucose-Capillary 175 (*) 70 - 99 mg/dL   Radiology Results: No results found. General appearance: alert, cooperative and no distress Throat: gauze intact orally. Mimimal to no hemmorhage. Pharynx clear. Neck: supple, symmetrical, trachea midline  ASSESSMENT: Doing well post-op.  PLAN: continue current treatment. D/C in am if no complications.   [name] 09/30/2014

## 2014-10-01 DIAGNOSIS — K088 Other specified disorders of teeth and supporting structures: Secondary | ICD-10-CM | POA: Diagnosis not present

## 2014-10-01 LAB — GLUCOSE, CAPILLARY: GLUCOSE-CAPILLARY: 118 mg/dL — AB (ref 70–99)

## 2014-10-01 MED ORDER — DOCUSATE SODIUM 100 MG PO CAPS: 100.0000 mg | ORAL_CAPSULE | Freq: Two times a day (BID) | ORAL | Status: AC

## 2014-10-01 MED ORDER — DOCUSATE SODIUM 100 MG PO CAPS
100.0000 mg | ORAL_CAPSULE | Freq: Two times a day (BID) | ORAL | Status: DC
  Administered 2014-10-01: 100 mg via ORAL
  Filled 2014-10-01: qty 1

## 2014-10-01 NOTE — Op Note (Signed)
NAME:  [name], [name]            ACCOUNT NO.:  [id number]  MEDICAL RECORD NO.:  [id number]  LOCATION:  [facility]                         FACILITY:  [facility]  PHYSICIAN:  Georgia LopesScott M. Caliya Narine, M.D.  DATE OF BIRTH:  1951-04-04  DATE OF PROCEDURE:  09/30/2014 DATE OF DISCHARGE:                              OPERATIVE REPORT   PREOPERATIVE DIAGNOSIS:  Nonrestorable teeth #2, 3, 6, 7, 8, 9, 10, 11, 12, 13, 14, 15, 16, 19, 20, 21, 22, 28, 30, 31.  POSTOPERATIVE DIAGNOSIS:  Nonrestorable teeth #2, 3, 6, 7, 8, 9, 10, 11, 12, 13, 14, 15, 16, 19, 20, 21, 22, 28, 30, 31.  PROCEDURE:  Extraction of teeth #2, 3, 6, 7, 8, 9, 10, 11, 12, 13, 14, 15, 16, 19, 20, 21, 22, 28, 30, 31, alveoplasty right and left maxilla, right and left mandible.  SURGEON:  Georgia LopesScott M. Rica Heather, MD  ANESTHESIA:  Gennie AlmaGeneral, Smith attending, nasal intubation.  DESCRIPTION OF PROCEDURE:  The patient was brought to the operating room, placed on the table in supine position.  General anesthesia was administered intravenously and a nasal endotracheal tube was placed and secured.  The eyes were protected.  The patient was draped for the procedure.  Time-out was performed.  The posterior pharynx was suctioned.  The throat pack was placed.  A 2% lidocaine with 1:100,000 epinephrine was infiltrated in an inferior alveolar block on the right and left side and a buccal and palatal infiltration in the maxilla. Total of 19 mL was utilized.  A bite block was placed in the right side of the mouth and a sweetheart retractor was used to retract the tongue. A 15 blade was used to make an incision around teeth #19, 20, 21, 22 in the mandible, and around teeth #7, 8, 9, 10, 11, 12, 13, 14, 15, 16 in the maxilla.  The periosteum was reflected from around these teeth, and the teeth were elevated with a 301 elevator.  Tooth #19 was removed using a cowhorn forceps, however, the roots fractured and additional bone was removed with a Stryker handpiece and a  701 fissure bur and then the roots were removed.  Teeth #20 was removed which was a small fragment of the tooth on the gingival surface.  Teeth # 21 and 22 were removed using the Asch forceps.  The lower sockets were then curetted and then the periosteum was reflected to expose the alveolar crest. Alveoplasty was performed using the egg-shaped bur and bone file.  Then, the area was sutured and closed with 3-0 chromic.  In the maxilla, the 301 elevator was used to elevate the upper left teeth.  The teeth were removed using the #150 forceps.  Teeth #14 and 15 fractured requiring additional bone removal with the Stryker handpiece and the use of a root tip pick to remove the residual roots.  Tooth #11 fractured which required additional bone removal.  The other teeth were removed with the 150 forceps in routine fashion.  Then, the periosteum was reflected to expose the alveolar bone which was irregular in shape, and required alveoplasty.  The egg-shaped bur and bone file were then used for this procedure.  Then, the area was irrigated and  closed with 3-0 chromic.  A larger bite block was then placed in the mouth on the left side, and the sweetheart retractor was repositioned to retract the tongue towards the left, and a 15 blade was used to make incision around teeth #28, 30, and 31 in the mandible, and around teeth #2, 3, and 6 in the maxilla.  The periosteum was reflected.  The teeth were elevated with a 301 elevator. The lower teeth were removed with the lower forceps and the upper teeth were removed with the #150 forceps.  The sockets were curetted.  Then, the periosteum was reflected to expose the alveolar crest and alveoplasty was performed using the egg-shaped bur and bone file in the maxilla and mandible on the right side.  Then, the areas were irrigated and closed with 3-0 chromic.  The oral cavity was then inspected and found to have good contour, hemostasis, and closure.  The oral  cavity was irrigated and suctioned.  Throat pack was removed.  The patient was awakened and taken to the recovery room, breathing spontaneously in good condition.  ESTIMATED BLOOD LOSS:  Minimal.  COMPLICATIONS:  None.  SPECIMENS:  None .     [name], M.D.     SMJ/MEDQ  D:  09/30/2014  T:  10/01/2014  Job:  [id number]

## 2014-10-01 NOTE — Discharge Summary (Signed)
Patient ID: [name] MRN: [id number] DOB/AGE: 62/05/1951 62 y.o.  Admit date: 09/30/2014 Discharge date: 10/01/2014  Admission Diagnoses: Post operative bleeding after dental extractions  Discharge Diagnoses:  Active Problems:   Post-operative state   Discharged Condition: good  Hospital Course: Admitted for post-op observation. Patient received IV pain management, antibiotics.  Consults: None  Significant Diagnostic Studies: none  Treatments: IV hydration, antibiotics: Ancef, analgesia: Dilaudid and surgery: Dental extractions, alveoloplasty  Discharge Exam: Blood pressure 107/51, pulse 84, temperature 98 F (36.7 C), temperature source Axillary, resp. rate 20, height 5\' 4"  (1.626 m), weight 144.244 kg (318 lb), SpO2 99.00%. General appearance: alert, cooperative, no distress and morbidly obese Head: Normocephalic, without obvious abnormality, atraumatic Eyes: negative Throat: sutures intact, minimal edema. hemostatic Neck: no adenopathy, no carotid bruit, no JVD, supple, symmetrical, trachea midline and thyroid not enlarged, symmetric, no tenderness/mass/nodules Resp: clear to auscultation bilaterally Cardio: regular rate and rhythm, S1, S2 normal, no murmur, click, rub or gallop  Disposition: 03-Skilled Nursing Facility  Discharge Instructions   Call MD for:  difficulty breathing, headache or visual disturbances    Complete by:  As directed      Call MD for:  difficulty breathing, headache or visual disturbances    Complete by:  As directed      Call MD for:  persistant nausea and vomiting    Complete by:  As directed      Call MD for:  persistant nausea and vomiting    Complete by:  As directed      Call MD for:  severe uncontrolled pain    Complete by:  As directed      Call MD for:  severe uncontrolled pain    Complete by:  As directed      Call MD for:  temperature >100.4    Complete by:  As directed      Call MD for:  temperature >100.4    Complete  by:  As directed      Diet - low sodium heart healthy    Complete by:  As directed   Soft Diet. Advance as tolerated.     Diet - low sodium heart healthy    Complete by:  As directed   Soft Diet. Advance as tolerated.     Discharge instructions    Complete by:  As directed   Restart Pradaxa 10/01/2014 if bleeding has stopped.  Warm salt water mouth rinses 4-5 times per day starting the day after surgery. Ice to affected area for 2-3 days. Soft diet, advance as tolerated. No smoking for 2 weeks. Follow-up visit with Dr. Barbette MerinoJensen as scheduled. Call [phone] for problems.     Discharge instructions    Complete by:  As directed   Warm salt water mouth rinses 4-5 times per day starting the day after surgery. Ice to affected area for 2-3 days. Soft diet, advance as tolerated. No smoking for 2 weeks. Follow-up visit with Dr. Barbette MerinoJensen as scheduled. Call [phone] for problems.     Increase activity slowly    Complete by:  As directed      Increase activity slowly    Complete by:  As directed             Medication List    STOP taking these medications       dabigatran 150 MG Caps capsule  Commonly known as:  PRADAXA      TAKE these medications       acetaminophen 325 MG  tablet  Commonly known as:  TYLENOL  2 by mouth every 4 hours for headache or fever > 100, if fever 101.5 contact MD. Do not exceed 4 gm of tylenol in 24 hours     amoxicillin 500 MG capsule  Commonly known as:  AMOXIL  Take 1 capsule (500 mg total) by mouth 3 (three) times daily.     Cholecalciferol 1000 UNITS tablet  Take 1,000 Units by mouth daily.     cloNIDine 0.2 MG tablet  Commonly known as:  CATAPRES  Take 0.2 mg by mouth 2 (two) times daily. For CHF     digoxin 0.125 MG tablet  Commonly known as:  LANOXIN  Take 0.125 mg by mouth daily.     divalproex 125 MG capsule  Commonly known as:  DEPAKOTE SPRINKLE  Take 1 by mouth every night at bedtime to stabilize depressive mood     docusate sodium  100 MG capsule  Commonly known as:  COLACE  Take 1 capsule (100 mg total) by mouth 2 (two) times daily.     finasteride 5 MG tablet  Commonly known as:  PROSCAR  Take 5 mg by mouth daily. For BPH     furosemide 40 MG tablet  Commonly known as:  LASIX  Take 60 mg by mouth 2 (two) times daily.     gabapentin 300 MG capsule  Commonly known as:  NEURONTIN  Take 300 mg by mouth daily.     insulin glargine 100 UNIT/ML injection  Commonly known as:  LANTUS  Inject 25 Units into the skin at bedtime.     lidocaine 5 %  Commonly known as:  LIDODERM  0.05 patches. 1/2 patch topically on both shins daily and discard patch within 12 hours for Osteoarthritis     lisinopril 40 MG tablet  Commonly known as:  PRINIVIL,ZESTRIL  Take 40 mg by mouth daily. For hypertension     metFORMIN 1000 MG tablet  Commonly known as:  GLUCOPHAGE  Take 1,000 mg by mouth 2 (two) times daily with a meal. For diabetes     oxyCODONE-acetaminophen 5-325 MG per tablet  Commonly known as:  PERCOCET  Take 1-2 tablets by mouth every 4 (four) hours as needed for severe pain.     potassium chloride SA 20 MEQ tablet  Commonly known as:  K-DUR,KLOR-CON  Take 40 mEq by mouth 2 (two) times daily. For Hypopotassemic     sodium chloride 0.65 % nasal spray  Commonly known as:  OCEAN  Place 1 spray into the nose 2 (two) times daily. For dry nares     spironolactone 25 MG tablet  Commonly known as:  ALDACTONE  Take 25 mg by mouth daily. For hypertension     tamsulosin 0.4 MG Caps capsule  Commonly known as:  FLOMAX  Take 0.4 mg by mouth daily. For BPH         Signed: Keyarra Rendall M 10/01/2014, 8:07 AM

## 2014-10-01 NOTE — Progress Notes (Signed)
Patient left unit without letting nurse know, he left his discharge instruction and prescription, unable to contact him as phone says disconnected, called his [facility] and made aware, instrcutions read to her and will mail his papers.

## 2014-10-02 ENCOUNTER — Encounter (HOSPITAL_COMMUNITY): Payer: Self-pay | Admitting: Oral Surgery

## 2014-10-08 ENCOUNTER — Ambulatory Visit: Payer: Medicaid Other | Admitting: Endocrinology

## 2014-10-08 ENCOUNTER — Telehealth: Payer: Self-pay | Admitting: Endocrinology

## 2014-10-08 NOTE — Telephone Encounter (Signed)
Patient no showed today's appt. Please advise on how to follow up. °A. No follow up necessary. °B. Follow up urgent. Contact patient immediately. °C. Follow up necessary. Contact patient and schedule visit in ___ days. °D. Follow up advised. Contact patient and schedule visit in ____weeks. ° °

## 2014-10-09 ENCOUNTER — Ambulatory Visit (HOSPITAL_COMMUNITY)
Admission: RE | Admit: 2014-10-09 | Discharge: 2014-10-09 | Disposition: A | Discharge status: Home / Self Care | Payer: Medicaid Other | Source: Ambulatory Visit | Attending: Cardiology | Admitting: Cardiology

## 2014-10-09 VITALS — BP 132/80 | HR 61 | Wt 319.2 lb

## 2014-10-09 DIAGNOSIS — Z7901 Long term (current) use of anticoagulants: Secondary | ICD-10-CM | POA: Insufficient documentation

## 2014-10-09 DIAGNOSIS — I429 Cardiomyopathy, unspecified: Secondary | ICD-10-CM | POA: Diagnosis not present

## 2014-10-09 DIAGNOSIS — N401 Enlarged prostate with lower urinary tract symptoms: Secondary | ICD-10-CM | POA: Diagnosis not present

## 2014-10-09 DIAGNOSIS — Z79891 Long term (current) use of opiate analgesic: Secondary | ICD-10-CM | POA: Insufficient documentation

## 2014-10-09 DIAGNOSIS — I272 Other secondary pulmonary hypertension: Secondary | ICD-10-CM | POA: Insufficient documentation

## 2014-10-09 DIAGNOSIS — E119 Type 2 diabetes mellitus without complications: Secondary | ICD-10-CM | POA: Diagnosis not present

## 2014-10-09 DIAGNOSIS — G629 Polyneuropathy, unspecified: Secondary | ICD-10-CM | POA: Insufficient documentation

## 2014-10-09 DIAGNOSIS — I5032 Chronic diastolic (congestive) heart failure: Secondary | ICD-10-CM | POA: Insufficient documentation

## 2014-10-09 DIAGNOSIS — I2510 Atherosclerotic heart disease of native coronary artery without angina pectoris: Secondary | ICD-10-CM | POA: Insufficient documentation

## 2014-10-09 DIAGNOSIS — I482 Chronic atrial fibrillation, unspecified: Secondary | ICD-10-CM

## 2014-10-09 DIAGNOSIS — J9620 Acute and chronic respiratory failure, unspecified whether with hypoxia or hypercapnia: Secondary | ICD-10-CM | POA: Insufficient documentation

## 2014-10-09 DIAGNOSIS — Z79899 Other long term (current) drug therapy: Secondary | ICD-10-CM | POA: Insufficient documentation

## 2014-10-09 DIAGNOSIS — G4733 Obstructive sleep apnea (adult) (pediatric): Secondary | ICD-10-CM | POA: Insufficient documentation

## 2014-10-09 DIAGNOSIS — Z794 Long term (current) use of insulin: Secondary | ICD-10-CM | POA: Diagnosis not present

## 2014-10-09 DIAGNOSIS — I10 Essential (primary) hypertension: Secondary | ICD-10-CM | POA: Insufficient documentation

## 2014-10-09 MED ORDER — ROSUVASTATIN CALCIUM 20 MG PO TABS: 20.0000 mg | ORAL_TABLET | Freq: Every day | ORAL | Status: DC

## 2014-10-09 NOTE — Patient Instructions (Signed)
Stop Digoxin  Start Crestor 20 mg daily  Your physician has requested that you have an echocardiogram. Echocardiography is a painless test that uses sound waves to create images of your heart. It provides your doctor with information about the size and shape of your heart and how well your heart's chambers and valves are working. This procedure takes approximately one hour. There are no restrictions for this procedure.  You have been referred to [facility] for overnight oximetry   Your physician recommends that you weigh, daily, at the same time every day, and in the same amount of clothing. Please record your daily weights on the handout provided and bring it to your next appointment.  We will contact you in 3 months to schedule your next appointment.

## 2014-10-09 NOTE — Progress Notes (Signed)
Patient ID: [name], male   DOB: March 23, 1951, 63 y.o.   MRN: [id number]    Cardiology Office Note  Date:  10/09/2014   ID:  [name], DOB Dec 04, 1951, MRN [id number]  PCP:  Jackie Plum, MD   History of Present Illness: [name] is a 63 y.o. 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  Previous EF was 35-40%.  EF 50% by last echo in 01/2013. I last saw him in 2012.   He saw Tereso Newcomer in May 2015 for pre-op clearance prior to removal of 20 teeth. He tolerated that ok. He was referred back to [facility] for f/u. He has been living in a nursing home for 3 years. Now living on his own since 08/19/14. Has nurse's aide come check on him every day. His HCPOA is also a nurse and checks on him every day.   He denies chest pain, orthopnea, PND, edema.  He is in a wheelchair mainly. He does not have a scale at home. Drinks a lot of fluid. He denies significant DOE or edema.  He denies syncope, dizziness, near syncope.  Denies significant fatigue.   Says he couldn't tolerate CPAP. Does not wear O2 at night. Weight unchanged from previous visit in May.    Studies:  - LHC (07/2009):  Distal LAD 50%, circumflex 30%, distal circumflex 50-60%, mid RCA 30%, AM 40%  - Echo (02/08/13):  Mild LVH, EF 50%, D-shaped interventricular septum, trivial MR, mild LAE, severe RVE, moderately reduced RVSF, moderate RAE, PASP 82-86 mm Hg (severe pulmonary HTN), trivial effusion   Recent Labs: 06/28/2014: ALT 9*  08/08/2014: TSH 0.02*  09/30/2014: Creatinine 1.30; Hemoglobin 15.6; Potassium 4.5 05/20/2014:  hgb 13.2 06/05/2014:  K 4.6, Cr 1.13 09/30/14: K 4.5 Creatinine 1.3   Wt Readings from Last 3 Encounters:  10/09/14 319 lb 4 oz (144.811 kg)  09/30/14 318 lb (144.244 kg)  09/30/14 318 lb (144.244 kg)      Past Medical History  Diagnosis Date  . 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 He had markedly elevated LV filling pressures and pulmonary venous hypertension with normal pulmonary vascular resistance suggesting he will normalize his pulmonary pressures with adequate diuresis.   . Renal insufficiency   . Chronic diastolic heart failure     Echo 8.25.2011:  Mod LVH; EF 50%; Mod LAE; RV dilation and ? dysfxn; mild RAE;   . Dyslipidemia   . Atrial fibrillation 08/26/2009    takes Pradaxa daily  . Chronic systolic heart failure 08/26/2009      Qualifier: Diagnosis of  By: Huntley Dec, Scott     . CORONARY ARTERY DISEASE, S/P PTCA 10/08/2009    Qualifier: Diagnosis of  By: Trevor Iha, RN, Heather    . Acute and chronic respiratory failure (acute-on-chronic) 02/07/2013  . BPH (benign prostatic hyperplasia) 07/23/2013  . Hyperkalemia 02/07/2013  . Other primary cardiomyopathies   . CHF (congestive heart failure)     takes Lasix daily  . Essential hypertension, benign     takes Lisinopril and Clonidine daily  . Peripheral neuropathy     takes gabapentin daily  . Joint pain   . Urinary frequency     d/t being on Lasix  . Urinary urgency   . OSA on  CPAP     "wear mask sometimes" (09/30/2014)  . SLEEP APNEA 08/26/2009    Annotation: diagnosed by overnight oximetry in hospital in Aug. 2010 does not want to wear CPAP Qualifier: Diagnosis of  By: Huntley Dec, Scott    . HYPERTHYROIDISM 02/23/2011    Qualifier: Diagnosis of  By: Daphine Deutscher FNP, Zena Amos    . Type II diabetes mellitus     takes Metformin daily and Lantus as bedtime  . Depression     denies on 09/30/2014; "just when I first went into nursing home"  . Enlarged prostate     takes Tamsulosin and Proscar daily    Current Outpatient Prescriptions  Medication Sig Dispense Refill  . acetaminophen (TYLENOL) 325 MG tablet 2 by mouth every  4 hours for headache or fever > 100, if fever 101.5 contact MD. Do not exceed 4 gm of tylenol in 24 hours      . amoxicillin (AMOXIL) 500 MG capsule Take 1 capsule (500 mg total) by mouth 3 (three) times daily.  21 capsule  0  . Cholecalciferol 1000 UNITS tablet Take 1,000 Units by mouth daily.      . digoxin (LANOXIN) 0.125 MG tablet Take 0.125 mg by mouth daily.      . divalproex (DEPAKOTE SPRINKLE) 125 MG capsule Take 1 by mouth every night at bedtime to stabilize depressive mood      . docusate sodium (COLACE) 100 MG capsule Take 1 capsule (100 mg total) by mouth 2 (two) times daily.  10 capsule  0  . finasteride (PROSCAR) 5 MG tablet Take 5 mg by mouth daily. For BPH      . furosemide (LASIX) 40 MG tablet Take 60 mg by mouth 2 (two) times daily.       Marland Kitchen gabapentin (NEURONTIN) 300 MG capsule Take 300 mg by mouth daily.      . insulin glargine (LANTUS) 100 UNIT/ML injection Inject 25 Units into the skin at bedtime.       . lidocaine (LIDODERM) 5 % 0.05 patches. 1/2 patch topically on both shins daily and discard patch within 12 hours for Osteoarthritis      . lisinopril (PRINIVIL,ZESTRIL) 40 MG tablet Take 40 mg by mouth daily. For hypertension      . metFORMIN (GLUCOPHAGE) 1000 MG tablet Take 1,000 mg by mouth 2 (two) times daily with a meal. For diabetes      . oxyCODONE-acetaminophen (PERCOCET) 5-325 MG per tablet Take 1-2 tablets by mouth every 4 (four) hours as needed for severe pain.  60 tablet  0  . potassium chloride SA (K-DUR,KLOR-CON) 20 MEQ tablet Take 40 mEq by mouth 2 (two) times daily. For Hypopotassemic      . sodium chloride (OCEAN) 0.65 % nasal spray Place 1 spray into the nose 2 (two) times daily. For dry nares      . spironolactone (ALDACTONE) 25 MG tablet Take 25 mg by mouth daily. For hypertension      . tamsulosin (FLOMAX) 0.4 MG CAPS capsule Take 0.4 mg by mouth daily. For BPH       No current facility-administered medications for this encounter.    Allergies:   Review  of patient's allergies indicates no known allergies.   Social History:  The patient  reports that he has never smoked. He has never used smokeless tobacco. He reports that he does not drink alcohol or use illicit drugs.   Family History:  The patient's family history includes Hypertension in an other family  member.   ROS:  Please see the history of present illness.   No bleeding problems.    All other systems reviewed and negative.   PHYSICAL EXAM: VS:  BP 132/80  Pulse 61  Wt 319 lb 4 oz (144.811 kg)  SpO2 98% Well nourished, well developed, in no acute distress HEENT: normal Neck: no JVD hard to see but appears flatat 90 degrees Cardiac:  normal S1, S2; irreg irreg; no murmur Lungs:  clear to auscultation bilaterally, no wheezing, rhonchi or rales Abd: soft, nontender, no hepatomegaly Ext: no edema Skin: warm and dry Neuro:  CNs 2-12 intact, no focal abnormalities noted   ASSESSMENT AND PLAN: Chronic diastolic heart failure:  Volume appears stable.  Continue current Rx. Will provide him a scale. Advised him to cut back on fluid intake. Reinforced need for daily weights and reviewed use of sliding scale diuretics. Atrial fibrillation, chronic:  He denies a hx of CVA. Continue Pradaxa. Will stop digoxin. Can add metoprolol as needed for HR control.  3.   Coronary Artery Disease:  No angina. He is no on ASA as he is on Pradaxa.  Given DM2 and CAD needs statin. Will start Crestor 20. PCP to follow lipids 4.   Essential hypertension, benign:  Controlled.  5.   Pulmonary HTN: likely due to OSA/OHS. Will repeat echo. Needs to lose weight. Will check overnight oximetry. He cannot tolerate CPAP but may benefit from nighttime O2 6.   Morbid obesity: encouraged him to watch his diet to lose weight  Total time spent 35 minutes. Over half that time spent discussing above.   Will see one more time in HF Clinic. If volume stable can return to general cardiology pool.    Olman Yono,MD 10:52 AM

## 2014-10-09 NOTE — Addendum Note (Signed)
Encounter addended by: Noralee Space, RN on: 10/09/2014 11:05 AM<BR>     Documentation filed: Medications, Patient Instructions Section, Orders

## 2014-10-11 NOTE — Telephone Encounter (Signed)
Follow up necessary. Contact patient and schedule visit in 7-10 days 

## 2014-10-22 ENCOUNTER — Ambulatory Visit (HOSPITAL_COMMUNITY): Payer: Medicaid Other

## 2014-10-24 ENCOUNTER — Ambulatory Visit (HOSPITAL_COMMUNITY): Payer: Medicaid Other

## 2014-10-29 ENCOUNTER — Ambulatory Visit: Payer: Medicaid Other | Admitting: Endocrinology

## 2014-11-05 ENCOUNTER — Other Ambulatory Visit (HOSPITAL_COMMUNITY): Payer: Self-pay | Admitting: Internal Medicine

## 2014-11-06 ENCOUNTER — Encounter: Payer: Medicaid Other | Admitting: Endocrinology

## 2014-11-06 ENCOUNTER — Encounter: Payer: Self-pay | Admitting: Endocrinology

## 2014-11-06 NOTE — Progress Notes (Signed)
This encounter was created in error - please disregard.

## 2014-11-06 NOTE — Progress Notes (Deleted)
Patient ID: [name], male   DOB: 1951-06-15, 63 y.o.   MRN: [id number]                                                                Reason for Appointment:  Followup of thyroid    History of Present Illness:   He had hyperthyroidism initially diagnosed in early 2014. 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 Subsequently methimazole was stopped He does not feel any different since his last visit and has no unusual fatigue or palpitations  As of 7/15 thyroid levels showed a normal free T4  Thyroid labs:   Lab Results  Component Value Date   FREET4 0.81 08/08/2014   FREET4 1.01 06/27/2014   FREET4 1.92* 04/22/2014   TSH 0.02* 08/08/2014   TSH 0.02* 06/27/2014   TSH 0.03* 04/22/2014    Problem 2: Multinodular goiter He apparently had a thyroid nodule in 2012 measuring about 3.5 cm. Followup ultrasound in 06/2013 showed the following: Focal nodules: Lower pole right-sided nodule which is mildly hyperechoic. 1.2 x 1.3 x 1.2 cm. Similar to minimally enlarged from 1.0 x 0.9 x 1.0 cm on the prior exam.  An inter/upper pole minimally hypoechoic right-sided nodule measures 9 x 6 x 7 mm. Not readily apparent on the prior exam.  Left-sided inter/lower pole heterogeneous solid mass. 3.5 x 3.0 x 3.0 cm. On the prior exam, this measured 3.5 x 2.3 x 2.3 cm.   Clinically his goiter has been smaller since he was treated for hyperthyroidism     Medication List       This list is accurate as of: 11/06/14  3:46 PM.  Always use your most recent med list.               acetaminophen 325 MG tablet  Commonly known as:  TYLENOL  2 by  mouth every 4 hours for headache or fever > 100, if fever 101.5 contact MD. Do not exceed 4 gm of tylenol in 24 hours     Cholecalciferol 1000 UNITS tablet  Take 1,000 Units by mouth daily.     CRESTOR 20 MG tablet  Generic drug:  rosuvastatin  TAKE 1 TABLET BY MOUTH DAILY     divalproex 125 MG capsule  Commonly known as:  DEPAKOTE SPRINKLE  Take 1 by mouth every night at bedtime to stabilize depressive mood     docusate sodium 100 MG capsule  Commonly known as:  COLACE  Take 1 capsule (100 mg total) by mouth 2 (two) times daily.     finasteride 5 MG tablet  Commonly known as:  PROSCAR  Take 5 mg by mouth daily. For BPH     furosemide 40 MG tablet  Commonly known as:  LASIX  Take 60 mg by mouth 2 (two) times daily.     gabapentin 300 MG capsule  Commonly known as:  NEURONTIN  Take 300 mg by mouth daily.     insulin glargine 100 UNIT/ML injection  Commonly known as:  LANTUS  Inject 25 Units into the skin at bedtime.     lidocaine 5 %  Commonly known as:  LIDODERM  0.05 patches. 1/2 patch topically on both shins daily and discard patch within 12 hours for Osteoarthritis     lisinopril 40 MG tablet  Commonly known as:  PRINIVIL,ZESTRIL  Take 40 mg by mouth daily. For hypertension     metFORMIN 1000 MG tablet  Commonly known as:  GLUCOPHAGE  Take 1,000 mg by mouth 2 (two) times daily with a meal. For diabetes     oxyCODONE-acetaminophen 5-325 MG per tablet  Commonly known as:  PERCOCET  Take 1-2 tablets by mouth every 4 (four) hours as needed for severe pain.     potassium chloride SA 20 MEQ tablet  Commonly known as:  K-DUR,KLOR-CON  Take 40 mEq by mouth 2 (two) times daily. For Hypopotassemic     sodium chloride 0.65 % nasal spray  Commonly known as:  OCEAN  Place 1 spray into the nose 2 (two) times daily. For dry nares     spironolactone 25 MG tablet  Commonly known as:  ALDACTONE  Take 25 mg by mouth daily. For hypertension     tamsulosin 0.4 MG Caps  capsule  Commonly known as:  FLOMAX  Take 0.4 mg by mouth daily. For BPH            Past Medical History  Diagnosis Date  . 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 He had markedly elevated LV filling pressures and pulmonary venous hypertension with normal pulmonary vascular resistance suggesting he will normalize his pulmonary pressures with adequate diuresis.   . Renal insufficiency   . Chronic diastolic heart failure     Echo 8.25.2011:  Mod LVH; EF 50%; Mod LAE; RV dilation and ? dysfxn; mild RAE;   . Dyslipidemia   . Atrial fibrillation 08/26/2009    takes Pradaxa daily  . Chronic systolic heart failure 08/26/2009      Qualifier: Diagnosis of  By: Huntley DecWeaver PA-C, Scott     . CORONARY ARTERY DISEASE, S/P PTCA 10/08/2009    Qualifier: Diagnosis of  By: Trevor IhaSchub, RN, Heather    . Acute and chronic respiratory failure (acute-on-chronic) 02/07/2013  . BPH (benign prostatic hyperplasia) 07/23/2013  . Hyperkalemia 02/07/2013  . Other primary cardiomyopathies   . CHF (congestive heart failure)     takes Lasix daily  . Essential hypertension, benign     takes Lisinopril and Clonidine daily  . Peripheral neuropathy     takes gabapentin daily  . Joint pain   . Urinary frequency     d/t being on Lasix  . Urinary urgency   . OSA on CPAP     "wear mask sometimes" (09/30/2014)  . SLEEP APNEA 08/26/2009    Annotation: diagnosed by overnight oximetry in hospital in Aug. 2010 does not want to wear CPAP Qualifier: Diagnosis of  By: Huntley DecWeaver PA-C, Scott    . HYPERTHYROIDISM 02/23/2011    Qualifier: Diagnosis of  By: Daphine DeutscherMartin FNP, Zena AmosNykedtra    . Type II diabetes mellitus     takes Metformin daily and Lantus as bedtime  . Depression     denies on 09/30/2014; "just when I first went into nursing home"  . Enlarged prostate  takes Tamsulosin and Proscar daily    Past Surgical History  Procedure  Laterality Date  . Multiple tooth extractions  09/30/2014    # 2, 3, 6, 7, 8, 9, 10, 11,12, 13, 14, 15, 16, 19, 20, 21, 22, 28, 30, 31  w/alveoloplasty  . Cardiac catheterization  07/2009    Hattie Perch 09/03/2009  . Multiple extractions with alveoloplasty N/A 09/30/2014    Procedure: MULTIPLE EXTRACION WITH ALVEOLOPLASTY;  Surgeon: [name], DDS;  Location: [facility] OR;  Service: Oral Surgery;  Laterality: N/A;    Family History  Problem Relation Age of Onset  . Hypertension      Aunts    Social History:  reports that he has never smoked. He has never used smokeless tobacco. He reports that he does not drink alcohol or use illicit drugs.  Allergies: No Known Allergies  Review of Systems:  CARDIOLOGY:  he has a  history of high blood pressure.  Also apparently has had CHF but recently asymptomatic. History of atrial fibrillation     ENDOCRINOLOGY:  he has had long-standing diabetes treated with insulin and followed by PCP, A1c just over 7% usually He is being treated by his PCP with Lantus and metformin  He has renal insufficiency  He says he has had significant peripheral neuropathy in his legs causing weakness and is using a wheelchair to ambulate.            Examination:   BP 124/78 mmHg  Pulse 80  Temp(Src) 98 F (36.7 C)  Resp 16  Ht   Wt   SpO2 94%   General Appearance:  he has generalized obesity. He looks well  Eyes: No prominence or swelling Neck: The thyroid is not palpable on the right side. Has a  palpable firm left lobe enlargement which is slightly nodular and about 1-1/2 times normal, mostly felt on swallowing on the left side Neurological: REFLEXES: At biceps appear normal.  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

## 2014-11-11 IMAGING — US US SOFT TISSUE HEAD/NECK
1 series · 13 of 25 positions shown · non-contrast
Comparison: 03/03/2011

CLINICAL DATA: Thyroid mass.

THYROID ULTRASOUND
TECHNIQUE: Ultrasound examination of the thyroid gland and adjacent
soft tissues was performed.

[Series 1: us soft tissue head/neck · 0.11mm/px · 13 of 56 slices shown]
[im 1/56]
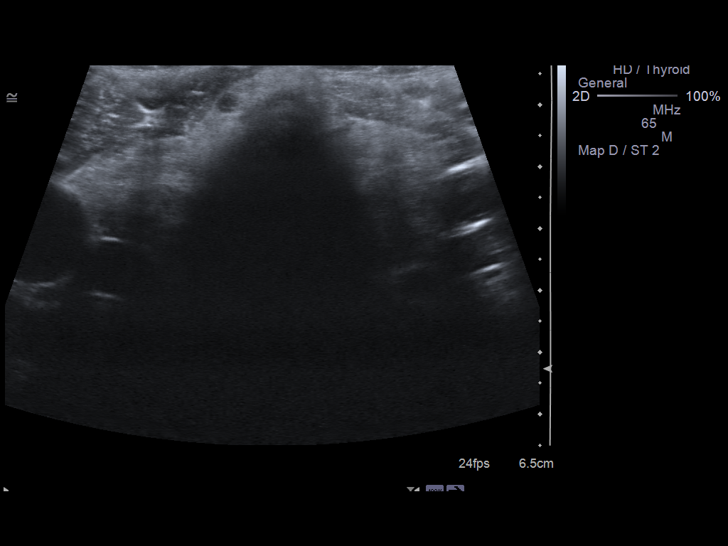
[im 5/56]
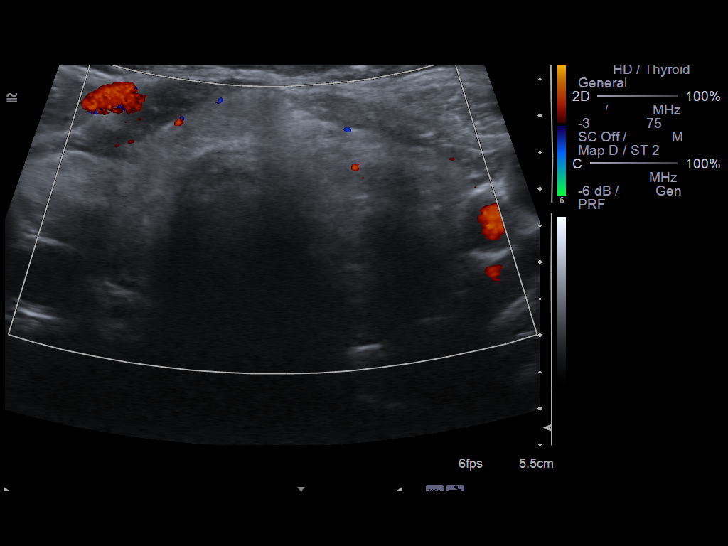
[im 10/56]
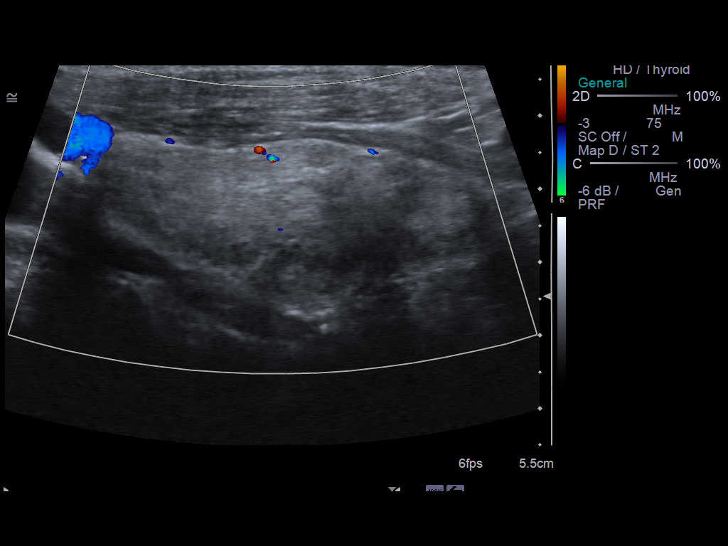
[im 14/56]
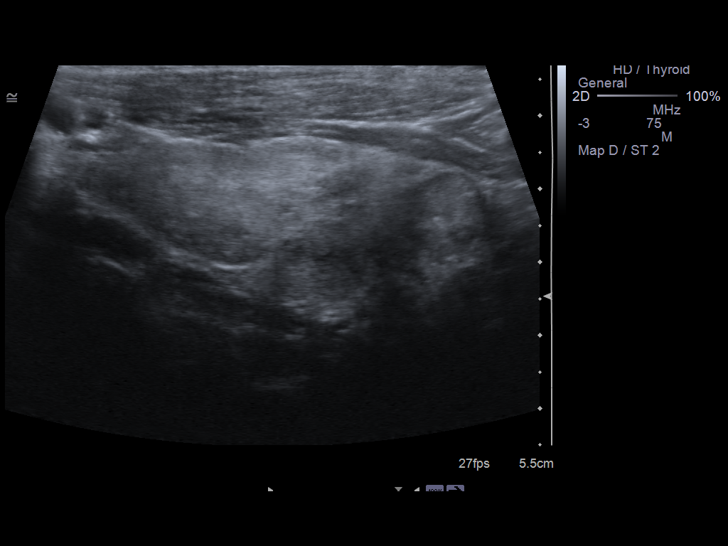
[im 19/56]
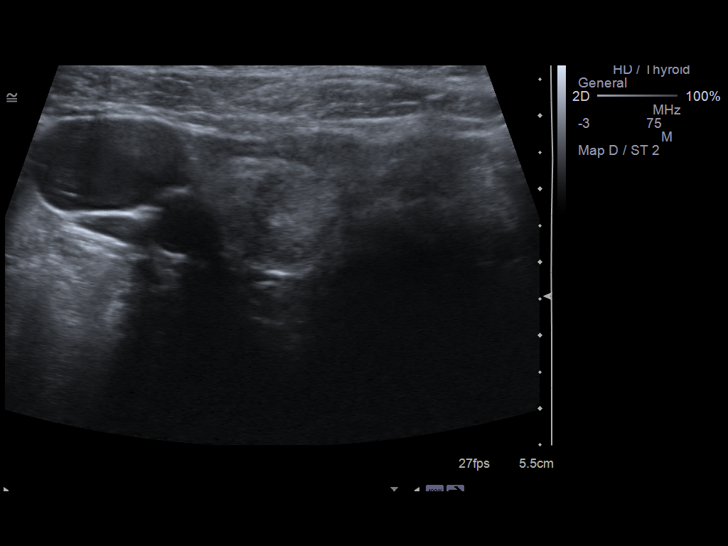
[im 23/56]
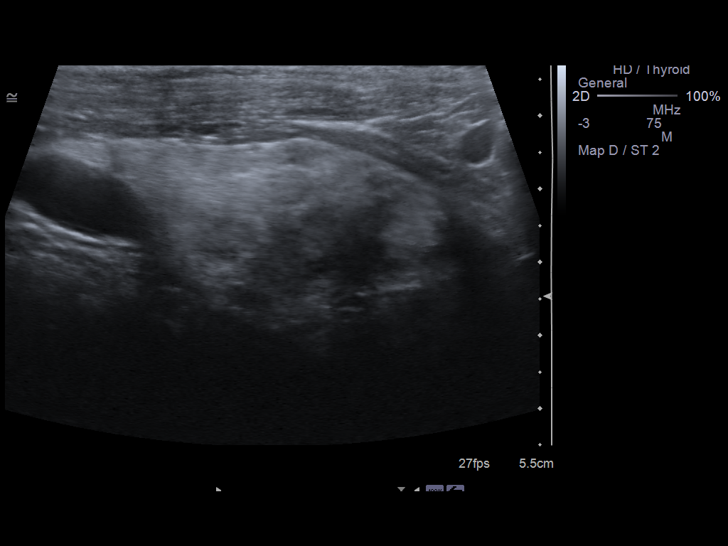
[im 28/56]
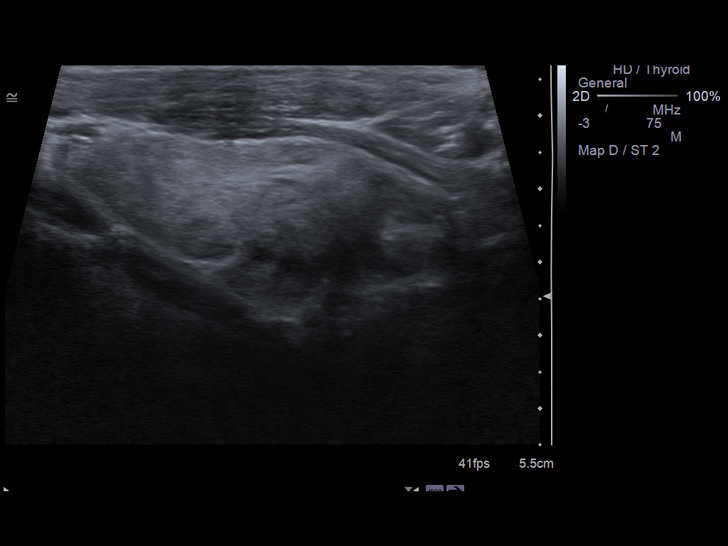
[im 33/56]
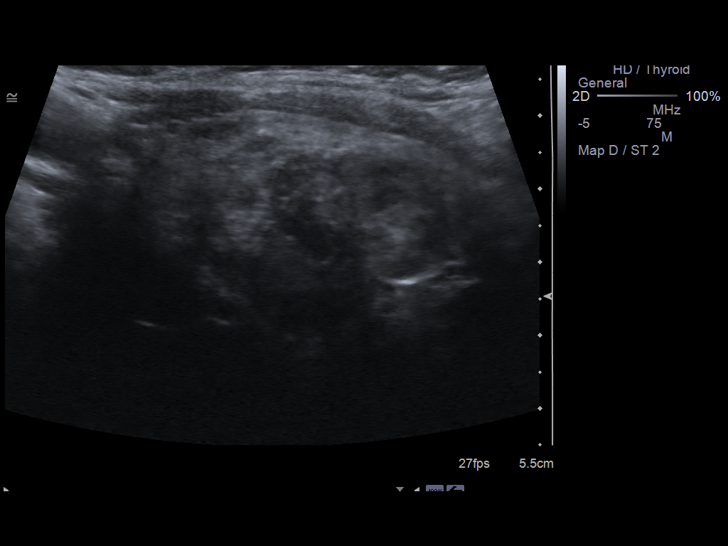
[im 37/56]
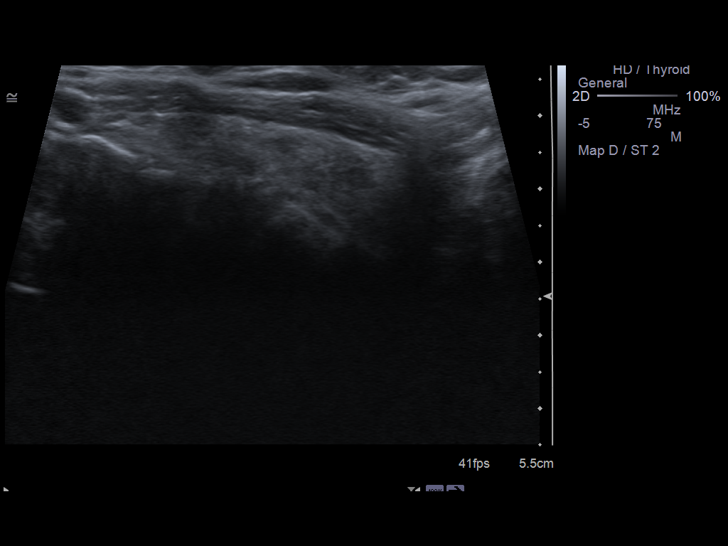
[im 42/56]
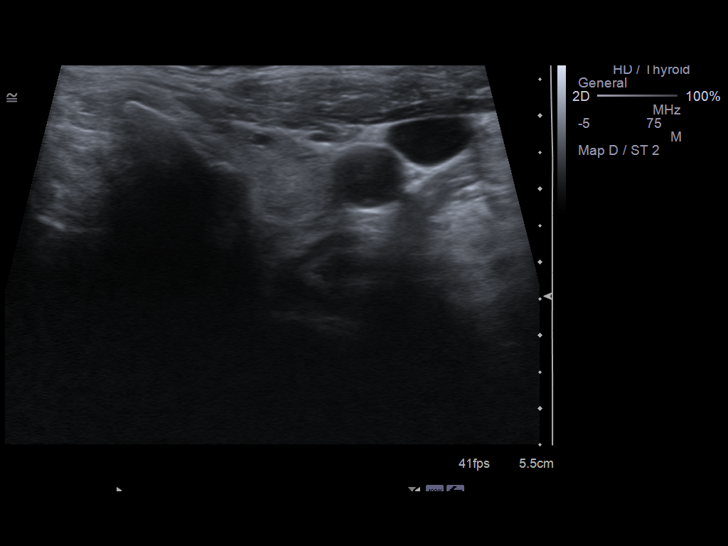
[im 46/56]
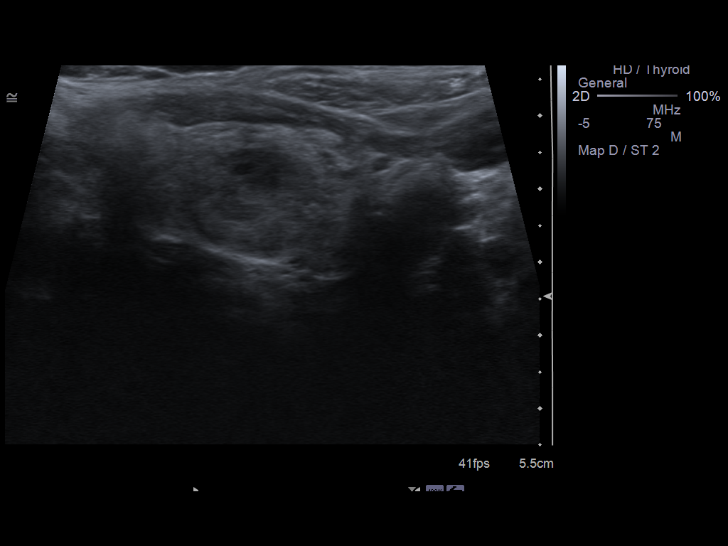
[im 51/56]
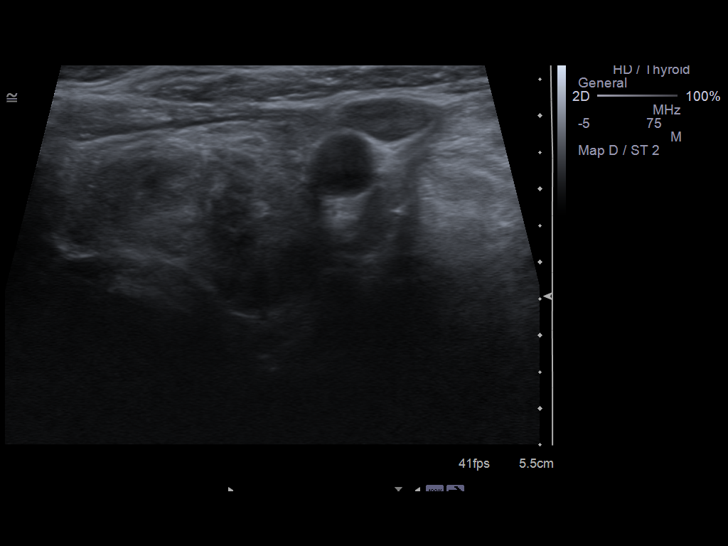
[im 56/56]
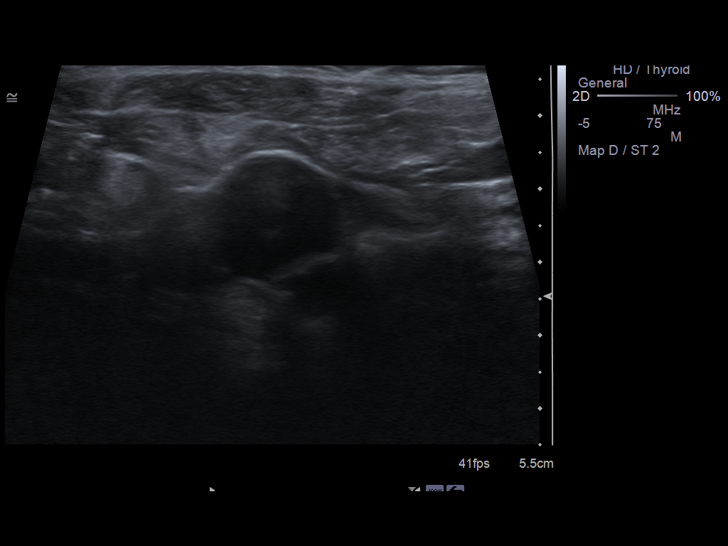

[13 of 25 positions shown; findings below may reference images not displayed]

FINDINGS: Right thyroid lobe:  5.6 x 2.3 x 2.4 cm
Left thyroid lobe:  4.9 x 2.9 x 1.8 cm
Isthmus:  8 mm

Focal nodules:  Lower pole right-sided nodule which is mildly
hyperechoic.  1.2 x 1.3 x 1.2 cm.  Similar to minimally enlarged
from 1.0 x 0.9 x 1.0 cm on the prior exam.

An inter/upper pole minimally hypoechoic right-sided nodule
measures 9 x 6 x 7 mm.  Not readily apparent on the prior exam.

Left-sided inter/lower pole heterogeneous solid mass.  3.5 x 3.0 x
3.0 cm.  On the prior exam, this measured 3.5 x 2.3 x 2.3 cm.  No
calcifications within.

Lymphadenopathy:  Absent
IMPRESSION: Inter/lower pole left sided heterogeneous mass.  Cannot exclude
minimal interval enlargement.  This meets consensus criteria for
tissue sampling.  This should be considered, if not already
performed. This recommendation follows the consensus statement:
Management of Thyroid Nodules Detected at US:  Society of
Radiologists in Ultrasound Consensus Conference Statement.

No other lesions warrant tissue sampling.  A lower pole right-sided
nodule may be minimally enlarged since the prior exam.  Consider
ultrasound follow-up.

## 2014-11-18 ENCOUNTER — Ambulatory Visit (HOSPITAL_COMMUNITY)
Admission: RE | Admit: 2014-11-18 | Discharge: 2014-11-18 | Disposition: A | Discharge status: Home / Self Care | Payer: Medicaid Other | Source: Ambulatory Visit | Attending: Internal Medicine | Admitting: Internal Medicine

## 2014-11-18 ENCOUNTER — Other Ambulatory Visit (HOSPITAL_COMMUNITY): Payer: Self-pay | Admitting: Internal Medicine

## 2014-11-18 DIAGNOSIS — I313 Pericardial effusion (noninflammatory): Secondary | ICD-10-CM | POA: Insufficient documentation

## 2014-11-18 DIAGNOSIS — I509 Heart failure, unspecified: Secondary | ICD-10-CM

## 2014-11-18 NOTE — Progress Notes (Signed)
Echocardiogram 2D Echocardiogram has been performed.  [name] 11/18/2014, 4:32 PM

## 2015-01-10 ENCOUNTER — Ambulatory Visit: Payer: Medicaid Other | Admitting: Podiatry

## 2015-01-15 ENCOUNTER — Ambulatory Visit (INDEPENDENT_AMBULATORY_CARE_PROVIDER_SITE_OTHER): Payer: Medicaid Other | Admitting: Podiatry

## 2015-01-15 ENCOUNTER — Encounter: Payer: Self-pay | Admitting: Podiatry

## 2015-01-15 VITALS — Ht 64.0 in | Wt 310.0 lb

## 2015-01-15 DIAGNOSIS — M79606 Pain in leg, unspecified: Secondary | ICD-10-CM

## 2015-01-15 DIAGNOSIS — B351 Tinea unguium: Secondary | ICD-10-CM | POA: Insufficient documentation

## 2015-01-15 NOTE — Progress Notes (Signed)
Subjective: Patient presents stating that his aid noted a black spot on right great toe. The area is not painful.  IDDM under control.   Review of Systems - General ROS: negative for - chills, fatigue, fever or sleep disturbance Ophthalmic ROS: negative ENT ROS: negative Allergy and Immunology ROS: negative Endocrine ROS: Diabetic under control.  Respiratory ROS: no cough, shortness of breath, or wheezing Cardiovascular ROS: no chest pain or dyspnea on exertion Gastrointestinal ROS: no abdominal pain, change in bowel habits, or black or bloody stools Genito-Urinary ROS: no dysuria, trouble voiding, or hematuria Musculoskeletal ROS: negative Neurological ROS: no TIA or stroke symptoms Dermatological ROS: negative.  Objective: Dermatologic: Thick dystrophic nails x 10. Black round hard spot epidermal layer at distal end right great toe without associated edema, erythema or pain. Neurovascular status are within normal. Positive of normal response to Monofilament sensory testing bilateral. Positive of subjective numbness and tingling on both feet.  Assessment: Old hematoma at dermal layer right great toe without infection or inflammation. Onychomycosis x 10.  Plan: Reviewed findings. All nails debrided.

## 2015-01-15 NOTE — Patient Instructions (Signed)
Seen for hypertrophic nails and black spot on right great toe. All nails debrided. Noted of old dry blood.  Return in 3 months or as needed.

## 2015-04-16 ENCOUNTER — Encounter: Payer: Self-pay | Admitting: Podiatry

## 2015-04-16 ENCOUNTER — Ambulatory Visit (INDEPENDENT_AMBULATORY_CARE_PROVIDER_SITE_OTHER): Payer: Medicaid Other | Admitting: Podiatry

## 2015-04-16 VITALS — BP 117/76 | HR 73

## 2015-04-16 DIAGNOSIS — M79606 Pain in leg, unspecified: Secondary | ICD-10-CM | POA: Diagnosis not present

## 2015-04-16 DIAGNOSIS — B351 Tinea unguium: Secondary | ICD-10-CM

## 2015-04-16 NOTE — Patient Instructions (Signed)
Seen for hypertrophic nails. Having Neuropathy pain. All nails debrided. May benefit from Neuremedy.  Return in 3 months or as needed.

## 2015-04-16 NOTE — Progress Notes (Signed)
Subjective: 64 year old male presents riding wheelchair complaining of pain and numbness on bottom of both feet. They feel like have gone asleep. Wearing Lidoderm patch on anterior lower leg. Stated that patch helps with pain in his feet and leg.   Objective: Dermatologic: Thick and disfigured nails x 10. Neurovascular status are within normal.  Positive of normal response to Monofilament sensory testing bilateral. Positive of subjective numbness and tingling on both feet. Orthopedic: Flat arch with disfigured lesser digits.  Assessment: Onychomycosis x 10. Peripheral neuropathy. Pain in lower limbs.  Plan: Reviewed findings. All nails debrided.

## 2015-05-14 ENCOUNTER — Other Ambulatory Visit (HOSPITAL_COMMUNITY): Payer: Self-pay | Admitting: Internal Medicine

## 2015-07-16 ENCOUNTER — Encounter: Payer: Self-pay | Admitting: Podiatry

## 2015-07-16 ENCOUNTER — Ambulatory Visit (INDEPENDENT_AMBULATORY_CARE_PROVIDER_SITE_OTHER): Payer: Medicaid Other | Admitting: Podiatry

## 2015-07-16 VITALS — BP 107/80 | HR 79

## 2015-07-16 DIAGNOSIS — B351 Tinea unguium: Secondary | ICD-10-CM | POA: Diagnosis not present

## 2015-07-16 DIAGNOSIS — M79606 Pain in leg, unspecified: Secondary | ICD-10-CM

## 2015-07-16 NOTE — Patient Instructions (Signed)
Seen for hypertrophic nails. Also discussed diabetic shoes. All nails debrided. Return in 3 months or as needed.

## 2015-07-16 NOTE — Progress Notes (Signed)
Subjective: 64 year old male presents via wheelchair requesting toe nails trimmed and considering diabetic shoes.   Objective: Dermatologic: Thick and disfigured nails x 10. Neurovascular status are within normal.  Positive of normal response to Monofilament sensory testing bilateral. Positive of subjective numbness and tingling on both feet. Orthopedic: Flat arch with disfigured lesser digits.  Assessment: Onychomycosis x 10. Peripheral neuropathy. Pain in lower limbs.  Plan: Reviewed findings. All nails debrided. Bled nail bed(pin point bleeding) cleansed with Iodine and compression bandaid placed.  Diabetic shoes discussed.

## 2015-10-16 ENCOUNTER — Ambulatory Visit: Payer: Medicaid Other | Admitting: Podiatry

## 2015-10-17 ENCOUNTER — Ambulatory Visit: Payer: Medicaid Other | Admitting: Podiatry

## 2015-12-18 ENCOUNTER — Other Ambulatory Visit (HOSPITAL_COMMUNITY): Payer: Self-pay | Admitting: Pharmacist

## 2015-12-18 MED ORDER — ATORVASTATIN CALCIUM 80 MG PO TABS: 80.0000 mg | ORAL_TABLET | Freq: Every day | ORAL | Status: DC

## 2015-12-18 NOTE — Telephone Encounter (Signed)
Crestor non-preferred with Medicaid, will try atorvastatin first. New rx sent to pharmacy and patient notified of change.   Tyler Deis. Bonnye Fava, PharmD, BCPS, CPP Clinical Pharmacist Pager: [phone] Phone: [phone] 12/18/2015 11:16 AM

## 2016-01-25 IMAGING — CR DG CHEST 2V
2 series · 2 of 2 positions shown · non-contrast
Comparison: Portable chest x-ray February 09, 2013

CLINICAL DATA: Acute it upon chronic respiratory failure; history
of CHF, sleep apnea, and hypertension ; preoperative exam prior to
multiple tooth extractions with alveoloplasty.

EXAM:
CHEST  2 VIEW

[w chest pa]
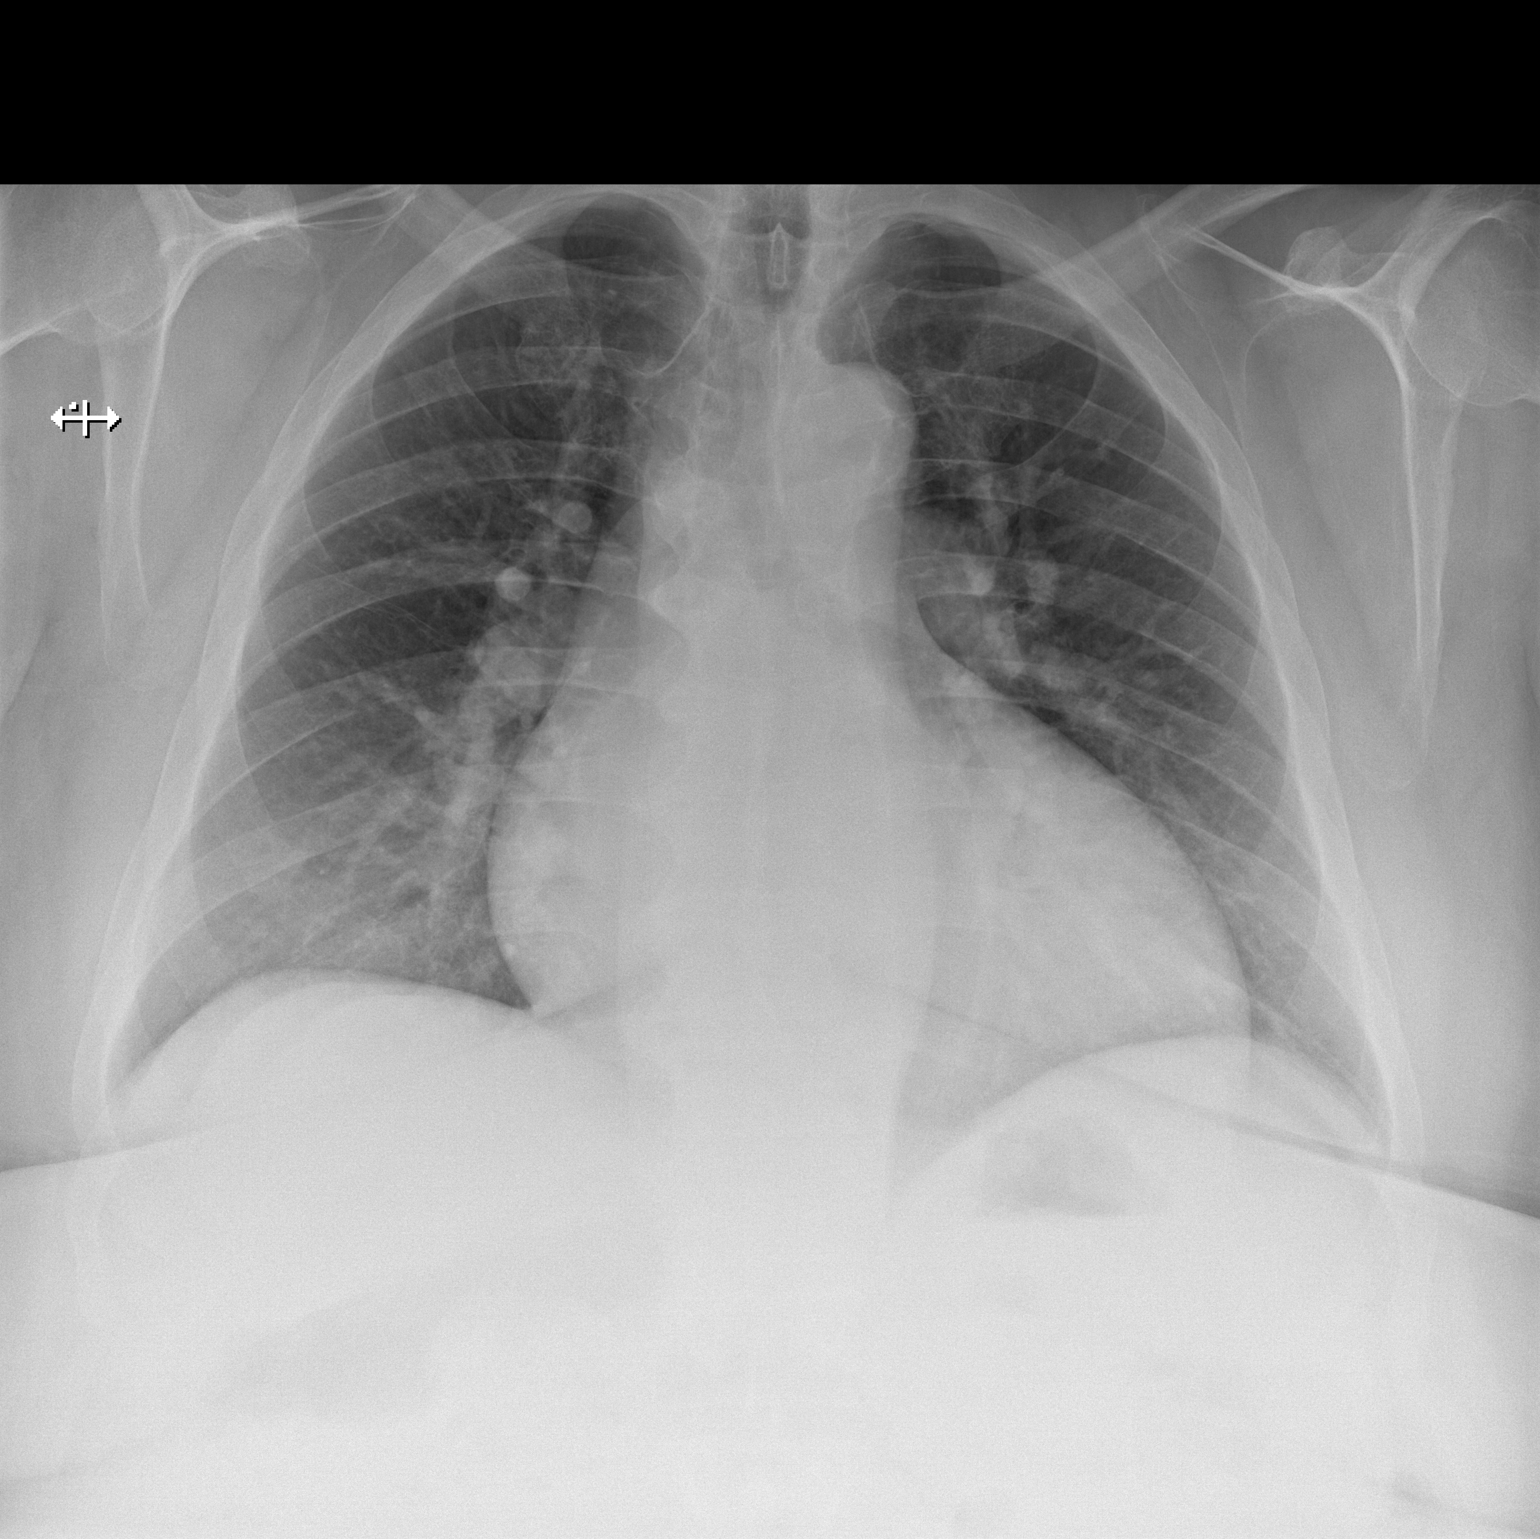

[w chest lat]
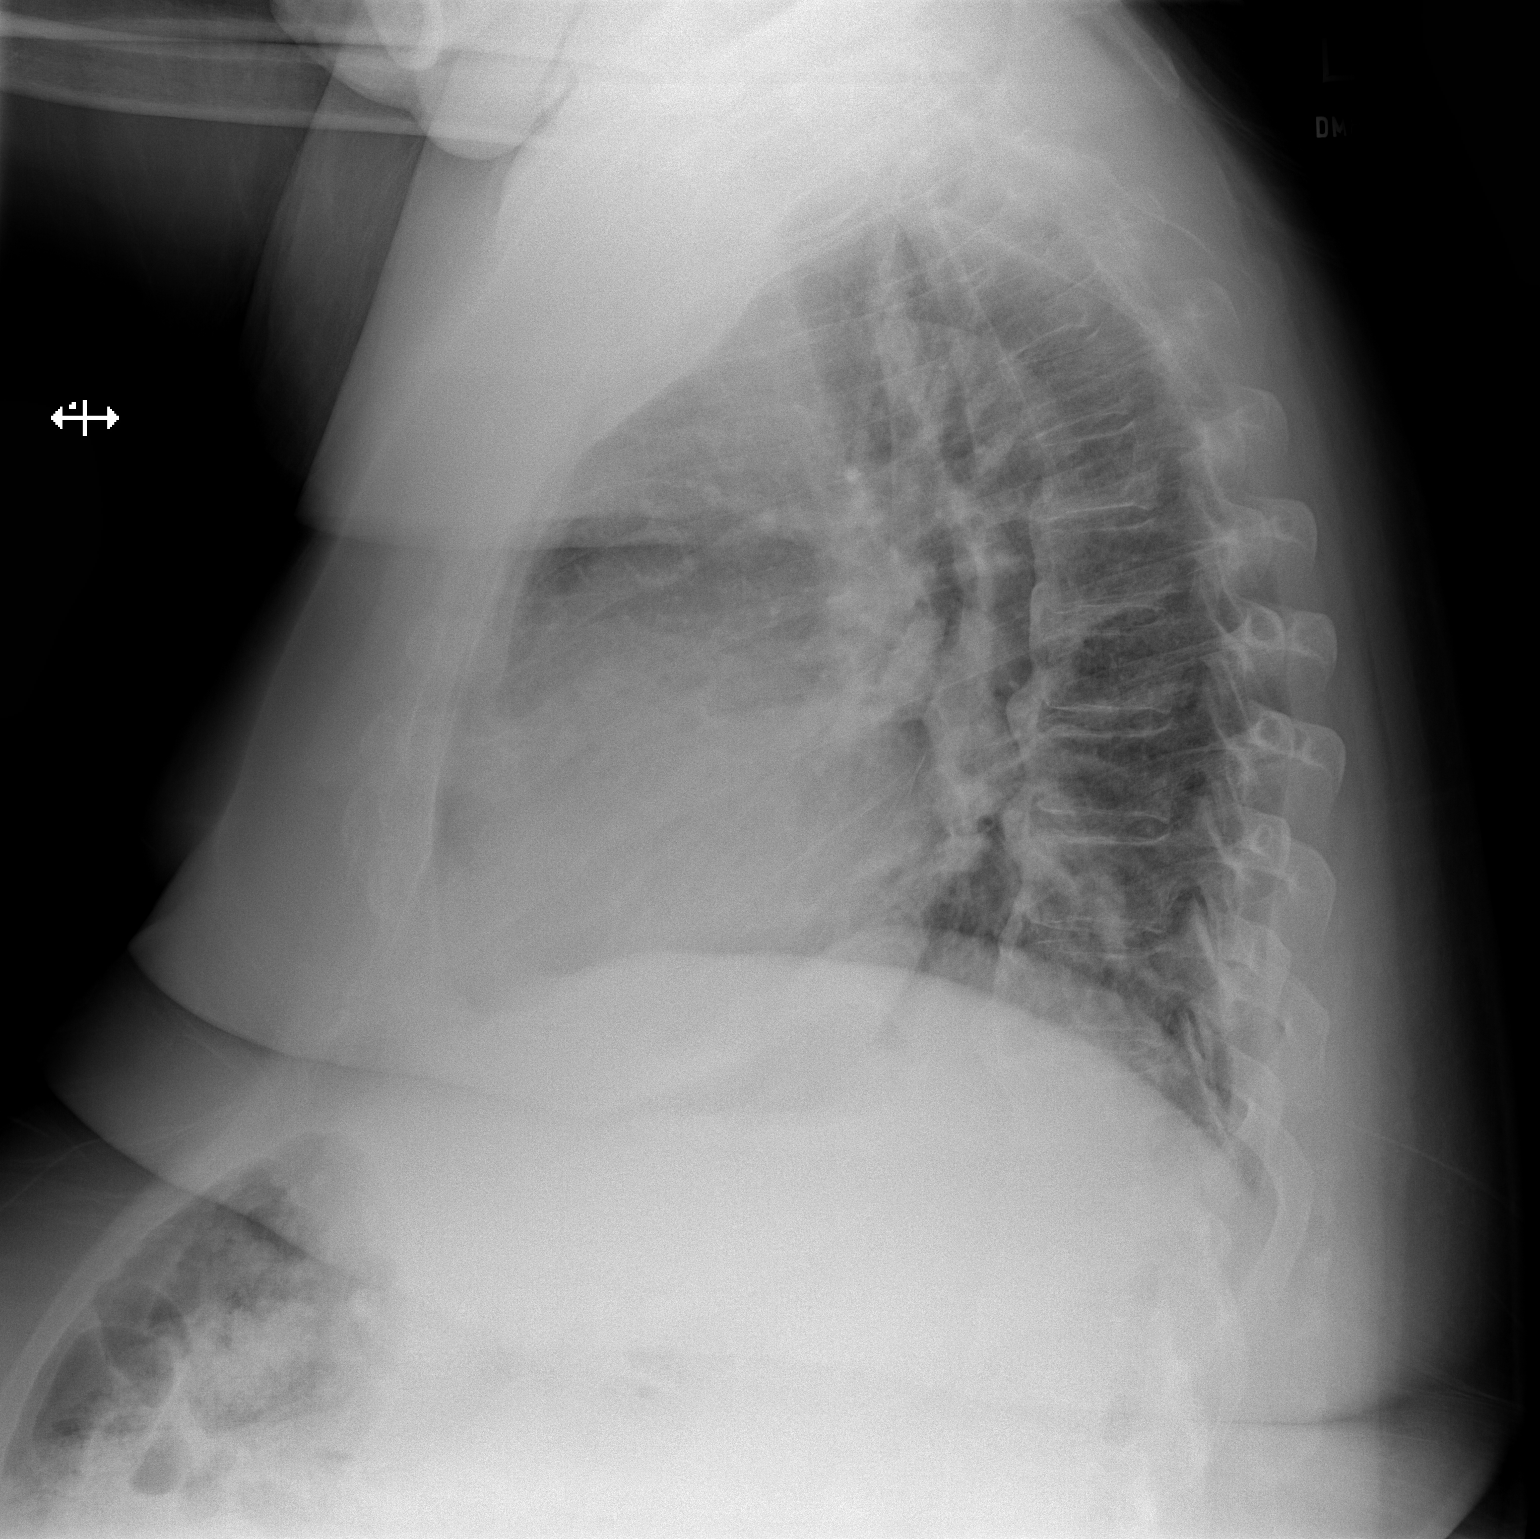

[2 of 2 positions shown; findings below may reference images not displayed]

FINDINGS: The lungs are adequately inflated. The interstitial markings are
coarse but not as congested as on the previous study. The
cardiopericardial silhouette remains enlarged. The central pulmonary
vascularity remains prominent. There is no pleural effusion. There
is tortuosity of the descending thoracic aorta. The bony thorax
exhibits no acute abnormality. There is calcification of the
anterior longitudinal ligament of the thoracic spine.
IMPRESSION: Low-grade compensated CHF. There is no evidence of pulmonary edema
currently.

## 2016-03-03 ENCOUNTER — Encounter: Payer: Self-pay | Admitting: Podiatry

## 2016-03-03 ENCOUNTER — Ambulatory Visit (INDEPENDENT_AMBULATORY_CARE_PROVIDER_SITE_OTHER): Payer: Medicaid Other | Admitting: Podiatry

## 2016-03-03 DIAGNOSIS — M7741 Metatarsalgia, right foot: Secondary | ICD-10-CM

## 2016-03-03 DIAGNOSIS — B351 Tinea unguium: Secondary | ICD-10-CM | POA: Diagnosis not present

## 2016-03-03 DIAGNOSIS — M79606 Pain in leg, unspecified: Secondary | ICD-10-CM

## 2016-03-03 DIAGNOSIS — M216X9 Other acquired deformities of unspecified foot: Secondary | ICD-10-CM

## 2016-03-03 DIAGNOSIS — M7742 Metatarsalgia, left foot: Principal | ICD-10-CM

## 2016-03-03 NOTE — Progress Notes (Signed)
Subjective: 65 year old male presents complaining of thick problematic toe nails and pain in balls of both feet. Patient uses wheelchair and considering diabetic shoes.  Patient is 5'4" and weighs about 350 lbs.   Objective: Dermatologic: Thick and disfigured nails x 10. No abnormal skin lesions plantar ball.  Neurovascular status are within normal.  Positive of normal response to Monofilament sensory testing bilateral. Positive of subjective numbness and tingling on both feet. Orthopedic: Flat arch with disfigured lesser digits.  Assessment: Onychomycosis x 10. Peripheral neuropathy. Pain in balls of lesser MPJ plantar bilateral secondary to obesity.  Plan: Reviewed findings. All nails debrided. Bled nail bed(pin point bleeding) cleansed with Iodine and compression bandaid placed.  Custom/OTC orthotics, Diabetic shoe and shoe liner discussed to support instep.

## 2016-03-03 NOTE — Patient Instructions (Signed)
Seen for hypertrophic nails and pain in ball of both feet. All nails debrided. May need diabetic shoe liner.  Return as needed.

## 2016-05-06 ENCOUNTER — Ambulatory Visit: Payer: Medicaid Other | Admitting: Physical Therapy

## 2016-06-10 ENCOUNTER — Ambulatory Visit: Payer: Medicaid Other | Attending: Internal Medicine | Admitting: Physical Therapy

## 2016-06-10 DIAGNOSIS — R2681 Unsteadiness on feet: Secondary | ICD-10-CM

## 2016-06-10 DIAGNOSIS — M6281 Muscle weakness (generalized): Secondary | ICD-10-CM | POA: Diagnosis not present

## 2016-06-10 NOTE — Therapy (Signed)
[facility] [address] [place], [place], [address] Phone: [phone]   Fax:  [phone]  Physical Therapy Treatment  Patient Details  Name: [name] MRN: [id number] Date of Birth: November 01, 1951 No Data Recorded  Encounter Date: 06/10/2016      PT End of Session - 06/10/16 1429    Visit Number 1   Number of Visits 1   PT Start Time 0900   PT Stop Time 1000   PT Time Calculation (min) 60 min      Past Medical History  Diagnosis Date  . 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 He had markedly elevated LV filling pressures and pulmonary venous hypertension with normal pulmonary vascular resistance suggesting he will normalize his pulmonary pressures with adequate diuresis.   . Renal insufficiency   . Chronic diastolic heart failure ([place])     Echo 8.25.2011:  Mod LVH; EF 50%; Mod LAE; RV dilation and ? dysfxn; mild RAE;   . Dyslipidemia   . Atrial fibrillation ([facility]) 08/26/2009    takes Pradaxa daily  . Chronic systolic heart failure ([facility]) 08/26/2009      Qualifier: Diagnosis of  By: Jorene Minors, Scott     . CORONARY ARTERY DISEASE, S/P PTCA 10/08/2009    Qualifier: Diagnosis of  By: Rose Fillers, RN, Heather    . Acute and chronic respiratory failure (acute-on-chronic) ([facility]) 02/07/2013  . BPH (benign prostatic hyperplasia) 07/23/2013  . Hyperkalemia 02/07/2013  . Other primary cardiomyopathies   . CHF (congestive heart failure) (H[facility])     takes Lasix daily  . Essential hypertension, benign     takes Lisinopril and Clonidine daily  . Peripheral neuropathy ([facility])     takes gabapentin daily  . Joint pain   . Urinary frequency     d/t being on Lasix  . Urinary urgency   . OSA on CPAP     "wear mask sometimes" (09/30/2014)  . SLEEP APNEA 08/26/2009    Annotation: diagnosed by overnight oximetry in hospital in Aug.  2010 does not want to wear CPAP Qualifier: Diagnosis of  By: Jorene Minors, Scott    . HYPERTHYROIDISM 02/23/2011    Qualifier: Diagnosis of  By: Hassell Done FNP, Tori Milks    . Type II diabetes mellitus (H[facility])     takes Metformin daily and Lantus as bedtime  . Depression     denies on 09/30/2014; "just when I first went into nursing home"  . Enlarged prostate     takes Tamsulosin and Proscar daily    Past Surgical History  Procedure Laterality Date  . Multiple tooth extractions  09/30/2014    # 2, 3, 6, 7, 8, 9, 10, 11,12, 13, 14, 15, 16, 19, 20, 21, 22, 28, 30, 31  w/alveoloplasty  . Cardiac catheterization  07/2009    [facility] Endo 09/03/2009  . Multiple extractions with alveoloplasty N/A 09/30/2014    Procedure: MULTIPLE EXTRACION WITH ALVEOLOPLASTY;  Surgeon: Gae Bon, DDS;  Location: [facility];  Service: Oral Surgery;  Laterality: N/A;    There were no vitals filed for this visit.       Mobility/Seating Recommendations    PATIENT INFORMATION: Name: [name] DOB: 1951/02/03  Sex: Male Date seen: 06/10/16 Time: 0845  Address:  [address] [place] [address] Physician: Benito Mccreedy, MD This evaluation/justification form will serve as the LMN for the following suppliers: __________________________ Supplier:  [facility] Contact Person: Luz Brazen Phone:  [phone]   Seating Therapist: Mady Haagensen, PT Phone:   [phone]   Phone: [phone] (Mobile)      Spouse/Parent/Caregiver name: [name]  Phone number: [phone] Insurance/Payer: Medicaid     Reason for Referral: Power wheelchair evaluation  Patient/Caregiver Goals: To be able to use power wheelchair for improved mobility in the home.  Patient was seen for face-to-face evaluation for new power wheelchair.  Also present was [employer] to discuss recommendations and wheelchair options.  Further paperwork was completed and sent to vendor.  Patient appears to qualify for power  mobility device at this time per objective findings.   MEDICAL HISTORY: Diagnosis: Primary Diagnosis: Morbid Obesity (E66.01) Onset: ????? Diagnosis: Chronic respiratory failure (J96.10), Encephalopathy (G93.41), Diabetes mellitus (E11.49), Neuropathy, chronic diastolic heart failure   <TFTDDUKGURKYHCWC>_3<\/JSEGBTDVVOHYWVPX>_1 Progressive Disease Relevant past and future surgeries: NA   Height: 5'4" Weight: 350 lbs Explain recent changes or trends in weight: Fluctuations in weight due to steroid medications in the past; fluid retention   History including Falls: History of falls several years ago, resulting in SNF placement.  Most recent fall in bathtub 1 month ago.  Pt reports increased fear of falling and uses wheelchair to avoid falls.    HOME ENVIRONMENT: _1 House  _2 Condo/town home  _3 Apartment  _4 Assisted Living    _5 Lives Alone _6  Lives with Others                                                                                          Hours with caregiver: 2 hours/day, M-F  _7 Home is accessible to patient           Stairs      _8 Yes _9  No     Ramp _10 Yes _11 No Comments:  Lives on first floor; uses elevator    COMMUNITY ADL: TRANSPORTATION: _12 Car    _13 Van    <GGYIRSWNIOEVOJJK>_0<\/XFGHWEXHBZJIRCVE>_93 Public Transportation    _15 Adapted w/c Lift    _16 Ambulance    _17 Other:       _18 Sits in wheelchair during transport  Employment/School: ????? Specific requirements pertaining to mobility ?????  Other: ?????    FUNCTIONAL/SENSORY PROCESSING SKILLS:  Handedness:   _19 Right     _20 Left    _21 NA  Comments:  ?????  Functional Processing Skills for Wheeled Mobility _22 Processing Skills are adequate for safe wheelchair operation  Areas of concern than may interfere with safe operation of wheelchair Description of problem   _23  Attention to environment      _24 Judgment      _25  Hearing  _26  Vision or visual processing      _27 Motor Planning  _28  Fluctuations in Behavior  ?????    VERBAL COMMUNICATION: _29 WFL receptive _30  WFL expressive _31 Understandable  _32 Difficult to  understand  _33 non-communicative _34  Uses an augmented communication device  CURRENT SEATING / MOBILITY: Current Mobility Base:  _35 None _36 Dependent _37 Manual _38 Scooter _39 Power  Type of Control: ?????  Manufacturer:  Drive Sentra Heavy DutySize:  22 x 18Age: approximately 3 years  Current Condition of Mobility Base:  Fair; no leg rests, rust at places   Current Wheelchair components:  ?????  Describe posture in present seating system:  Posterior pelvic tilt; able to reposition briefly into upright sitting      SENSATION and SKIN ISSUES: Sensation _0 Intact  _1 Impaired _2 Absent  Level of sensation: Pins and needles secondary to neuropathy in lower legs Pressure Relief: Able to perform effective pressure relief :    _3 Yes  _4  No Method: Brief change of positions If not, Why?: ?????  Skin Issues/Skin Integrity Current Skin Issues  _5 Yes _6 No _7 Intact _8  Red area_9  Open Area  _10 Scar Tissue _11 At risk from prolonged sitting Where  ?????  History of Skin Issues  _12 Yes _13 No Where  ????? When  ?????  Hx of skin flap surgeries  _14 Yes _15 No Where  ????? When  ?????  Limited sitting tolerance _16 Yes _17 No Hours spent sitting in wheelchair daily: 8+ hours  Complaint of Pain:  Please describe: Pt c/o occasional back pain, legs and feet pain.  Pain in legs and feet rated as 5-6/10.  Rubbing feet alleviates pain.     Swelling/Edema: Bilateral lower extremity edema; pt holds feet in dependent position   ADL STATUS (in reference to wheelchair use):  Indep Assist Unable Indep with Equip Not assessed Comments  Dressing X ????? ????? ????? ????? Sits edge of bed, uses adapative equipment  Eating X ????? ????? ????? ????? ?????  Toileting ????? ????? ????? X ????? Elevated toilet seat  Bathing ????? X ????? ????? ????? ?????  Grooming/Hygiene X ????? ????? ????? ????? ?????  Meal Prep ????? ????? ????? X ????? In wheelchair  IADLS ????? ????? ????? ????? X ?????  Bowel Management: _18 Continent   _19 Incontinent  _20 Accidents Comments:  ?????  Bladder Management: _21 Continent  _22 Incontinent  _23 Accidents Comments:  Wears pull-ups; has trouble making to restroom in time     WHEELCHAIR SKILLS: Manual w/c Propulsion: _24 UE or LE strength and endurance sufficient to participate in ADLs using manual wheelchair Arm : _25 left _26 right   _27 Both      Distance: ????? Foot:  _28 left _29 right   _30 Both  Operate Scooter: _31  Strength, hand grip, balance and transfer appropriate for use _32 Living environment is accessible for use of scooter  Operate Power w/c:  _33  Std. Joystick   _34  Alternative Controls Indep _35  Assist _36  Dependent/unable _37  N/A _38   _39 Safe          _40  Functional      Distance: ?????  Bed confined without wheelchair _41  Yes _42  No   STRENGTH/RANGE OF MOTION:  ????? Range of Motion Strength  Shoulder WFL Shoulder flexion bilateral 4/5, shoulder abduction R 3+/5, L 4/5  Elbow WFL Bilateral triceps/biceps 4/5  Wrist/Hand WFL Bilateral wrist flexion 3+/5,wrist extension 3+/5 (c/o pain in wrists and hands with repetitive wheelchair propulsion)  Hip limited hip flexion due to obesity/excess tissue in mid-section At least 3/5 bilateral  Knee WFL Quads 3+/5, Hamstrings 3+/5 bilateral  Ankle WFL Ankle dorsiflexion 4/5 RLE, 3+/5 LLE     MOBILITY/BALANCE:  _43  Patient is totally dependent for mobility  ?????    Balance Transfers Ambulation  Sitting Balance: Standing Balance: _44  Independent _45  Independent/Modified Independent  _46  WFL     _47  WFL _48  Supervision _49  Supervision  _50  Uses UE for balance  _51  Supervision _52  Min Assist _53  Ambulates with Assist  min assist/supervision    _54  Min Assist _55  Min assist _56  Mod Assist _57  Ambulates with Device:      _58  RW  _59  StW  _60  Cane  _61  20 ft  _62  Mod Assist _63  Mod assist _64   Max assist   _0  Max Assist _1  Max assist _2  Dependent _3  Indep. Short Distance Only  _4  Unable _5  Unable _6  Lift / Sling Required Distance (in feet)  ?????   _7  Sliding board  _8  Unable to Ambulate (see explanation below)  Cardio Status:  _9 Intact  _10  Impaired   _11  NA     History of CAD, atrial fibrillation, chronic diastolic heart failure  Respiratory Status:  _12 Intact   _13 Impaired   _14 NA     acute and chronic respiratory failure.  With 120 ft of wheelchair propulsion, 96% O2 sats and 81 bpm HR.  (Baseline 98% O2 and 67 bpm HR)  Orthotics/Prosthetics: ?????  Comments (Address manual vs power w/c vs scooter): Pt describes increased exertion with propulsion of manual wheelchair with bilateral lower extremities and occasional UE use.  Pt describes history of legs feeling that they will give way upon standing too long.  Timed Up and Go score:  29.74 sec with cane and min assist (>13.5 seconds indicates increased fall risk; 30 seconds score indicates increased difficulty with ADLs in the home.          Anterior / Posterior Obliquity Rotation-Pelvis ?????  PELVIS    _15  _16  _17   Neutral Posterior Anterior  _18  _19  _20   WFL Rt elev Lt elev  _21  _22  _23   WFL Right Left                      Anterior    Anterior     _24  Fixed _25  Other _26  Partly Flexible _27  Flexible   _28  Fixed _29  Other _30  Partly Flexible  _31  Flexible  _32  Fixed _33  Other _34  Partly Flexible  _35  Flexible   TRUNK  _36  _37  _38   WFL ? Thoracic ? Lumbar  Kyphosis Lordosis  _39  _40  _41   WFL Convex Convex  Right Left _42 c-curve _43 s-curve _44 multiple  _45  Neutral _46  Left-anterior _47  Right-anterior     _48  Fixed _49  Flexible _50  Partly Flexible _51  Other  _52  Fixed _53  Flexible _54  Partly Flexible _55  Other  _56  Fixed             _57  Flexible _58  Partly Flexible _59  Other    Position Windswept  ?????  HIPS          _60            _61               _62    Neutral       Abduct        ADduct         _63           _64            _65   Neutral Right           Left      _66  Fixed _67  Subluxed _68  Partly Flexible _69  Dislocated _70  Flexible  _71  Fixed _72  Other _73  Partly Flexible  _74  Flexible                 Foot Positioning Knee  Positioning  ?????    _75  WFL  _76 Lt _77 Rt _78  WFL  _79 Lt _80 Rt    KNEES ROM concerns: ROM concerns:    & Dorsi-Flexed _81 Lt _82 Rt ?????    FEET Plantar Flexed _83 Lt _84 Rt      Inversion                 _85 Lt _86 Rt      Eversion                 _87   Lt _0 Rt     HEAD _1  Functional _2  Good Head Control  ?????  & _3  Flexed         _4  Extended _5  Adequate Head Control    NECK _6  Rotated  Lt  _7  Lat Flexed Lt _8  Rotated  Rt _9  Lat Flexed Rt _10  Limited Head Control     _11  Cervical Hyperextension _12  Absent  Head Control     SHOULDERS ELBOWS WRIST& HAND Rounded shoulders bilateral      Left     Right    Left     Right    Left     Right   U/E _13 Functional           _14 Functional ????? ????? _15 Fisting             _16 Fisting      _17 elev   _18 dep      _19 elev   _20 dep       _21 pro -_22 retract     _23 pro  _24 retract _25 subluxed             _26 subluxed           Goals for Wheelchair Mobility  _27  Independence with mobility in the home with motor related ADLs (MRADLs)  _28  Independence with MRADLs in the community _29  Provide dependent mobility  _30  Provide recline     _31 Provide tilt   Goals for Seating system _32  Optimize pressure distribution _33  Provide support needed to facilitate function or safety _34  Provide corrective forces to assist with maintaining or improving posture _35  Accommodate client's posture:   current seated postures and positions are not flexible or will not tolerate corrective forces _36  Client to be independent with relieving pressure in the wheelchair _37 Enhance physiological function such as breathing, swallowing, digestion  Simulation ideas/Equipment trials:????? State why other equipment was unsuccessful:?????   MOBILITY BASE RECOMMENDATIONS and JUSTIFICATION: MOBILITY COMPONENT JUSTIFICATION  Manufacturer: MeritsModel: [employer] Super   Size: Width 22"Seat Depth 20" _38 provide transport from point A to B      _39 promote Indep mobility  _40 is not a safe, functional ambulator _41 [place]  or cane inadequate _42 non-standard width/depth necessary to accommodate anatomical measurement _43  ?????  _44 Manual Mobility Base _45 non-functional ambulator    _46 Scooter/POV  _47 can safely operate  _48 can safely transfer   _49 has adequate trunk stability  _50 cannot functionally propel manual w/c  _51 Power Mobility Base  _52 non-ambulatory  _53 cannot functionally propel manual wheelchair  _54  cannot functionally and safely operate scooter/POV _55 can safely operate and willing to  _56 Stroller Base _57 infant/child  _58 unable to propel manual wheelchair _59 allows for growth _60 non-functional ambulator _61 non-functional UE _62 Indep mobility is not a goal at this time  _63 Tilt  _64 Forward _65 Backward _66 Powered tilt  _67 Manual tilt  _68 change position against gravitational force on head and shoulders  _69 change position for pressure relief/cannot weight shift _70 transfers  _71 management of tone _72 rest periods _73 control edema _74 facilitate postural control  _75  ?????  _76 Recline  _77 Power recline on power base _78 Manual recline on manual base  _79 accommodate femur to back angle  _80 bring to full recline for ADL care  _81 change position for pressure relief/cannot weight shift _82 rest periods _83 repositioning for transfers or clothing/diaper /catheter changes _84 head positioning  _85 Lighter weight required _86 self- propulsion  _87 lifting _88  ?????  _89 Heavy Duty required _90 user weight greater than 250# _91 extreme tone/ over active movement _92 broken frame on previous chair _93  ?????  _94  Back  _95  Angle Adjustable _96  Custom molded Captain's Seat _97 postural control _98 control of tone/spasticity _99 accommodation of range of motion _100 UE functional  control _0 accommodation for seating system _1  ????? _2 provide lateral trunk support _3 accommodate deformity _4 provide posterior trunk support _5 provide lumbar/sacral support _6 support trunk in midline _7 Pressure relief over spinal processes  _8  Seat Cushion Captain's Seat  _9 impaired sensation  _10 decubitus ulcers present _11 history of pressure ulceration _12 prevent pelvic extension _13 low maintenance  _14 stabilize pelvis  _15 accommodate obliquity _16 accommodate multiple deformity _17 neutralize lower extremity position _18 increase pressure distribution _19  ?????  _20  Pelvic/thigh support  _21  Lateral thigh guide _22  Distal medial pad  _23  Distal lateral pad _24  pelvis in neutral _25 accommodate pelvis _26  position upper legs _27  alignment _28  accommodate ROM _29  decr adduction _30 accommodate tone _31 removable for transfers _32 decr abduction  _33  Lateral trunk Supports _34  Lt     _35  Rt _36 decrease lateral trunk leaning _37 control tone _38 contour for increased contact _39 safety  _40 accommodate asymmetry _41  ?????  _42  Mounting hardware  _43 lateral trunk supports  _44 back   _45 seat _46 headrest      _47  thigh support _48 fixed   _49 swing away _50 attach seat platform/cushion to w/c frame _51 attach back cushion to w/c frame _52 mount postural supports _53 mount headrest  _54 swing medial thigh support away _55 swing lateral supports away for transfers  _56  ?????    Armrests  _57 fixed _58 adjustable height _59 removable   _60 swing away  _61 flip back   _62 reclining _63 full length pads _64 desk    _65 pads tubular  _66 provide support with elbow at 90   _67 provide support for w/c tray _68 change of height/angles for variable activities _69 remove for transfers _70 allow to come closer to table top _71 remove for access to tables _72  ?????  Hangers/ Leg rests  _73 60 _74 70 _75 90 _76 elevating _77 heavy duty  _78 articulating _79 fixed _80 lift off _81 swing away     _82 power _83 provide LE support  _84 accommodate to hamstring tightness _85 elevate legs during recline   _86 provide change in position for Legs _87 Maintain placement of feet on footplate _88 durability _89 enable transfers _90 decrease edema _91 Accommodate lower leg length _92  ?????  Foot support Footplate    <EVOJJKKXFGHWEXHB>_7<\/JIRCVELFYBOFBPZW>_25 Lt  _94  Rt  _95  Center mount _96 flip up     _97 depth/angle  adjustable _98 Amputee adapter    _99  Lt     _100  Rt _101 provide foot support _102 accommodate to ankle ROM _103 transfers _104 Provide support for residual extremity _105  allow foot to go under wheelchair base _106  decrease tone  _107  ?????  _108  Ankle strap/heel loops _109 support foot on foot support _110 decrease extraneous movement _111 provide input to heel  _112 protect foot  Tires: _113 pneumatic  _114 flat free inserts  _115 solid  _116 decrease maintenance  _117 prevent frequent flats _118 increase shock absorbency _119 decrease pain from road shock _120 decrease spasms from road shock _121  ?????  _122  Headrest  _123 provide posterior head support _124 provide posterior neck support _125 provide lateral head support _126 provide anterior head support _127 support during tilt and recline _128 improve feeding   _129 improve respiration _130 placement of switches _131 safety  _132 accommodate ROM  _133 accommodate tone _134 improve visual orientation  _135  Anterior chest strap _136  Vest _137  Shoulder retractors  _138 decrease forward movement of shoulder _139 accommodation of TLSO _140 decrease forward movement of trunk _141 decrease shoulder elevation _142 added abdominal support _143 alignment _144 assistance with shoulder control  _145  ?????  Pelvic Positioner _146 Belt _147 SubASIS bar _148 Dual Pull _149 stabilize tone _150 decrease falling out of chair/ **will not Decr potential for sliding due to pelvic tilting _151 prevent excessive rotation _152 pad for protection over boney prominence _153 prominence comfort _154 special pull angle to control rotation _155  ?????  Upper Extremity Support _156 L   _157  R _158 Arm trough    _159 hand support _160  tray       _161 full tray _162 swivel mount _163 decrease edema      _164   decrease subluxation   _0 control tone   _1 placement for AAC/Computer/EADL _2 decrease gravitational pull on shoulders _3 provide midline positioning _4 provide support to increase UE function _5 provide hand support in natural position _6 provide work surface   POWER WHEELCHAIR CONTROLS  _7 Proportional   _8 Non-Proportional Type Joystick _9 Left  _10 Right _11 provides access for controlling wheelchair   _12 lacks motor control to operate proportional drive control <OHYWVPXTGGYIRSWN>_4<\/OEVOJJKKXFGHWEXH>_37 unable to understand proportional controls  Actuator Control Module  _14 Single  _15 Multiple   _16 Allow the client to operate the power seat function(s) through the joystick control   _17 Safety Reset Switches _18 Used to change modes and stop the wheelchair when driving in latch mode    _19 [employer]   _20 programming for accurate control _21 progressive Disease/changing condition _22 non-proportional drive control needed _23 Needed in order to operate power seat functions through joystick control   _24 Display box _25 Allows user to see in which mode and drive the wheelchair is set  _26 necessary for alternate controls    _27 Digital interface electronics _28 Allows w/c to operate when using alternative drive controls  <JIRCVELFYBOFBPZW>_2<\/HENIDPOEUMPNTIRW>_43 ASL Head Array _30 Allows client to operate wheelchair  through switches placed in tri-panel headrest  _31 Sip and puff with tubing kit _32 needed to operate sip and puff drive controls  <XVQMGQQPYPPJKDTO>_6<\/ZTIWPYKDXIPJASNK>_53 Upgraded tracking electronics _34 increase safety when driving <ZJQBHALPFXTKWIOX>_7<\/DZHGDJMEQASTMHDQ>_22 correct tracking when on uneven surfaces  _36 [facility] for switches or joystick _37 Attaches switches to w/c  _38 Swing away for access or transfers _39 midline for optimal placement _40 provides for consistent access  _41 Attendant controlled joystick plus mount _42 safety _43 long distance driving <WLNLGXQJJHERDEYC>_1<\/KGYJEHUDJSHFWYOV>_78 operation of seat functions _45 compliance with transportation regulations _46  ?????    Rear wheel placement/Axle adjustability _47 None _48 semi adjustable _49 fully adjustable  _50 improved UE access to wheels _51 improved stability _52 changing angle in space for improvement of postural stability _53 1-arm drive access <HYIFOYDXAJOINOMV>_6<\/HMCNOBSJGGEZMOQH>_47 amputee pad placement _55  ?????  Wheel rims/ hand rims  _56 metal  _57 plastic coated _58 oblique projections _59 vertical projections _60 Provide ability to propel manual wheelchair  _61  Increase  self-propulsion with hand weakness/decreased grasp  Push handles _62 extended  _63 angle adjustable  _64 standard _65 caregiver access _66 caregiver assist _67 allows "hooking" to enable increased ability to perform ADLs or maintain balance  One armed device  _68 Lt   _69 Rt _70 enable propulsion of manual wheelchair with one arm   _71  ?????   Brake/wheel lock extension _72  Lt   _73  Rt _74 increase indep in applying wheel locks   _75 Side guards _76 prevent clothing getting caught in wheel or becoming soiled _77  prevent skin tears/abrasions  Battery: NF 22 x 2 _78 to power wheelchair ?????  Other: ????? ????? ?????  The above equipment has a life- long use expectancy. Growth and changes in medical and/or functional conditions would be the exceptions. This is to certify that the therapist has no financial relationship with durable medical provider or manufacturer. The therapist will not receive remuneration of any kind for the equipment recommended in this evaluation.   Patient has mobility limitation that significantly impairs safe, timely participation in one or more mobility related ADL's.  (bathing, toileting, feeding, dressing, grooming, moving from room to room)                                                             _79  Yes _80  No Will mobility device sufficiently improve ability to participate and/or be aided in participation of MRADL's?         _81   Yes _0  No Can limitation be compensated for with use of a cane or [place]?                                                                                _1  Yes _2  No Does patient or caregiver demonstrate ability/potential ability & willingness to safely use the mobility device?   _3  Yes _4  No Does patient's home environment support use of recommended mobility device?                                                    _5  Yes _6  No Does patient have sufficient upper extremity function necessary to functionally propel a manual wheelchair?    _7  Yes _8  No Does patient  have sufficient strength and trunk stability to safely operate a POV (scooter)?                                  _9  Yes _10  No Does patient need additional features/benefits provided by a power wheelchair for MRADL's in the home?       _11  Yes _12  No Does the patient demonstrate the ability to safely use a power wheelchair?                                                              _13  Yes _14  No  Therapist Name Printed: ????? Date: ?????  Therapist's Signature:   Date:   Supplier's Name Printed: ????? Date: ?????  Supplier's Signature:   Date:  Patient/Caregiver Signature:   Date:     This is to certify that I have read this evaluation and do agree with the content within:    Physician's Name Printed: ?????  Physician's Signature:  Date:     This is to certify that I, the above signed therapist have the following affiliations: _15  This DME provider _16  Manufacturer of recommended equipment _17  Patient's long term care facility _18  None of the above                                           Patient will benefit from skilled therapeutic intervention in order to improve the following deficits and impairments:     Visit Diagnosis: Muscle weakness (generalized)  Unsteadiness on feet     Problem List Patient Active Problem List   Diagnosis Date Noted  . Pain in lower limb 04/16/2015  . Onychomycosis 01/15/2015  . Post-operative state 09/30/2014  . 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 Constipation 11/23/2013  . Type II or unspecified type diabetes mellitus with neurological manifestations, not stated as uncontrolled 09/14/2013  . BPH (benign prostatic hyperplasia) 07/23/2013  .  Urinary incontinence 07/23/2013  . Depression 03/13/2013  . Chronic pain 03/13/2013  . Anemia 03/13/2013  . GERD (gastroesophageal reflux disease) 03/13/2013  . Encephalopathy, metabolic 44/92/0100  . Chronic respiratory failure ([place]) 02/07/2013  .  Bradycardia 02/07/2013  . Chronic diastolic heart failure ([place]) 02/07/2013  . Acute and chronic respiratory failure (acute-on-chronic) ([place]) 02/07/2013  . Hyperkalemia 02/07/2013  . SHOULDER PAIN, BILATERAL 02/22/2011  . PARONYCHIA, FINGER 12/10/2009  . CHEST PAIN 10/07/2009  . COUGH 09/23/2009  . DYSLIPIDEMIA 08/26/2009  . Morbid obesity ([place]) 08/26/2009  . Essential hypertension, benign 08/26/2009  . Coronary Artery Disease 08/26/2009  . Atrial fibrillation, chronic ([place]) 08/26/2009  . RENAL INSUFFICIENCY 08/26/2009  . SLEEP APNEA 08/26/2009    [name] 06/10/2016, 2:31 P  Frazier Butt., PT  Frazier Butt., PT    [facility] [address] [place], [place], [address] Phone: [phone]   Fax:  [phone]  Name: [name] MRN: [id number] Date of Birth: [birth year]

## 2016-10-28 ENCOUNTER — Other Ambulatory Visit (HOSPITAL_COMMUNITY): Payer: Self-pay | Admitting: Internal Medicine

## 2016-12-29 ENCOUNTER — Ambulatory Visit (INDEPENDENT_AMBULATORY_CARE_PROVIDER_SITE_OTHER): Payer: Medicaid Other | Admitting: Podiatry

## 2016-12-29 ENCOUNTER — Encounter: Payer: Self-pay | Admitting: Podiatry

## 2016-12-29 DIAGNOSIS — M79673 Pain in unspecified foot: Secondary | ICD-10-CM

## 2016-12-29 DIAGNOSIS — M79606 Pain in leg, unspecified: Secondary | ICD-10-CM

## 2016-12-29 DIAGNOSIS — B351 Tinea unguium: Secondary | ICD-10-CM

## 2016-12-29 NOTE — Progress Notes (Signed)
Subjective: 66 year old male presents requesting toe nails trimmed. Patient is in motorized wheelchair due to unstable knee and able to move to treatment chair without assistance. Patient is considering diabetic shoes.  Patient is 5'4" and weighs about 350 lbs.   Objective: Dermatologic: Thick and disfigured nails x 10. No abnormal skin lesions plantar ball.  Neurovascular status are within normal.  Positive of normal response to Monofilament sensory testing bilateral. Positive of subjective pain and numbness on both feet and leg. Orthopedic: Flat arch with disfigured lesser digits.  Assessment: Onychomycosis x 10. Peripheral neuropathy. Neuropathy pain in foot and leg.   Plan: All nails debrided. Bleeding left great toe nail bed cleansed with Iodine and compression bandaid placed.  Patient picked up Neuremedy for neuropathy pain.  Discussed proper shoe gear.

## 2016-12-29 NOTE — Patient Instructions (Addendum)
Seen for hypertrophic nails. All nails debrided. As per request Neuremedy dispensed. Proper shoe gear discussed. Return in 3 months or as needed.

## 2017-03-29 ENCOUNTER — Ambulatory Visit: Payer: Medicaid Other | Admitting: Podiatry

## 2017-03-29 ENCOUNTER — Encounter: Payer: Self-pay | Admitting: Podiatry

## 2017-03-29 ENCOUNTER — Ambulatory Visit (INDEPENDENT_AMBULATORY_CARE_PROVIDER_SITE_OTHER): Payer: Medicaid Other | Admitting: Podiatry

## 2017-03-29 DIAGNOSIS — M79672 Pain in left foot: Secondary | ICD-10-CM

## 2017-03-29 DIAGNOSIS — M79671 Pain in right foot: Secondary | ICD-10-CM | POA: Diagnosis not present

## 2017-03-29 DIAGNOSIS — B351 Tinea unguium: Secondary | ICD-10-CM | POA: Diagnosis not present

## 2017-03-29 DIAGNOSIS — M7742 Metatarsalgia, left foot: Secondary | ICD-10-CM

## 2017-03-29 DIAGNOSIS — M7741 Metatarsalgia, right foot: Secondary | ICD-10-CM

## 2017-03-29 DIAGNOSIS — M79606 Pain in leg, unspecified: Secondary | ICD-10-CM

## 2017-03-29 NOTE — Patient Instructions (Signed)
Seen for hypertrophic nails. All nails debrided. Bleeding nail 5th toe left cleansed and dressed. Change band aid as needed and keep it covered for a week. Return in 3 months or as needed.

## 2017-03-29 NOTE — Progress Notes (Signed)
Subjective: 66 year old male presents requesting toe nails trimmed. Patient is in motorized wheelchair due to unstable knee and able to move to treatment chair without assistance. Stated that he feels like he is walking on rocks. Patient is 5'4" and weighs about 350 lbs.   Objective: Dermatologic: Thick and disfigured nails x 10. No abnormal skin lesions plantar ball.  Neurovascular status are within normal.  Positive of normal response to Monofilament sensory testing bilateral. Positive of subjective pain and numbness on both feet and leg. Orthopedic: Flat arch with disfigured lesser digits.  Assessment: Onychomycosis x 10. Peripheral neuropathy. Neuropathy pain in foot and leg.   Plan: All nails debrided. Return in 3 month.

## 2017-06-28 ENCOUNTER — Ambulatory Visit: Payer: Medicaid Other | Admitting: Podiatry

## 2017-08-15 DIAGNOSIS — E039 Hypothyroidism, unspecified: Secondary | ICD-10-CM | POA: Diagnosis not present

## 2017-08-15 DIAGNOSIS — I428 Other cardiomyopathies: Secondary | ICD-10-CM | POA: Diagnosis not present

## 2017-08-15 DIAGNOSIS — Z0000 Encounter for general adult medical examination without abnormal findings: Secondary | ICD-10-CM | POA: Diagnosis not present

## 2017-08-15 DIAGNOSIS — N183 Chronic kidney disease, stage 3 (moderate): Secondary | ICD-10-CM | POA: Diagnosis not present

## 2017-08-15 DIAGNOSIS — E785 Hyperlipidemia, unspecified: Secondary | ICD-10-CM | POA: Diagnosis not present

## 2017-08-15 DIAGNOSIS — G4733 Obstructive sleep apnea (adult) (pediatric): Secondary | ICD-10-CM | POA: Diagnosis not present

## 2017-08-15 DIAGNOSIS — M109 Gout, unspecified: Secondary | ICD-10-CM | POA: Diagnosis not present

## 2017-08-15 DIAGNOSIS — E7211 Homocystinuria: Secondary | ICD-10-CM | POA: Diagnosis not present

## 2017-08-15 DIAGNOSIS — I10 Essential (primary) hypertension: Secondary | ICD-10-CM | POA: Diagnosis not present

## 2017-08-15 DIAGNOSIS — I4891 Unspecified atrial fibrillation: Secondary | ICD-10-CM | POA: Diagnosis not present

## 2017-08-15 DIAGNOSIS — E119 Type 2 diabetes mellitus without complications: Secondary | ICD-10-CM | POA: Diagnosis not present

## 2017-09-13 ENCOUNTER — Encounter: Payer: Self-pay | Admitting: Podiatry

## 2017-09-13 ENCOUNTER — Ambulatory Visit (INDEPENDENT_AMBULATORY_CARE_PROVIDER_SITE_OTHER): Payer: Medicare Other | Admitting: Podiatry

## 2017-09-13 DIAGNOSIS — I872 Venous insufficiency (chronic) (peripheral): Secondary | ICD-10-CM | POA: Diagnosis not present

## 2017-09-13 DIAGNOSIS — E785 Hyperlipidemia, unspecified: Secondary | ICD-10-CM | POA: Diagnosis not present

## 2017-09-13 DIAGNOSIS — B351 Tinea unguium: Secondary | ICD-10-CM | POA: Diagnosis not present

## 2017-09-13 DIAGNOSIS — I428 Other cardiomyopathies: Secondary | ICD-10-CM | POA: Diagnosis not present

## 2017-09-13 DIAGNOSIS — M2042 Other hammer toe(s) (acquired), left foot: Secondary | ICD-10-CM

## 2017-09-13 DIAGNOSIS — I4891 Unspecified atrial fibrillation: Secondary | ICD-10-CM | POA: Diagnosis not present

## 2017-09-13 DIAGNOSIS — I10 Essential (primary) hypertension: Secondary | ICD-10-CM | POA: Diagnosis not present

## 2017-09-13 DIAGNOSIS — E0841 Diabetes mellitus due to underlying condition with diabetic mononeuropathy: Secondary | ICD-10-CM | POA: Diagnosis not present

## 2017-09-13 DIAGNOSIS — M2041 Other hammer toe(s) (acquired), right foot: Secondary | ICD-10-CM

## 2017-09-13 DIAGNOSIS — E039 Hypothyroidism, unspecified: Secondary | ICD-10-CM | POA: Diagnosis not present

## 2017-09-13 DIAGNOSIS — M109 Gout, unspecified: Secondary | ICD-10-CM | POA: Diagnosis not present

## 2017-09-13 DIAGNOSIS — E7211 Homocystinuria: Secondary | ICD-10-CM | POA: Diagnosis not present

## 2017-09-13 DIAGNOSIS — E119 Type 2 diabetes mellitus without complications: Secondary | ICD-10-CM | POA: Diagnosis not present

## 2017-09-13 DIAGNOSIS — M79606 Pain in leg, unspecified: Secondary | ICD-10-CM

## 2017-09-13 DIAGNOSIS — Z23 Encounter for immunization: Secondary | ICD-10-CM | POA: Diagnosis not present

## 2017-09-13 DIAGNOSIS — L03116 Cellulitis of left lower limb: Secondary | ICD-10-CM | POA: Diagnosis not present

## 2017-09-13 DIAGNOSIS — N183 Chronic kidney disease, stage 3 (moderate): Secondary | ICD-10-CM | POA: Diagnosis not present

## 2017-09-13 NOTE — Patient Instructions (Signed)
Seen for painful feet and hypertrophic nails. All nails debrided. Both feet measured for diabetic shoes. Patient will bring signed PCP authorization form for diabetic shoes.

## 2017-09-13 NOTE — Progress Notes (Signed)
Subjective: 66year old male presents requesting for diabetic shoes and toe nails trimmed. Patient is in motorized wheelchair due to unstable knee. Stated that toes are hurting in his shoes. Patient is 5'4" and weighs about 350 lbs.   Objective: Dermatologic: Thick and disfigured nails x 10. No abnormal skin lesions plantar ball.  Neurovascular status are within normal.  Positive of normal response to Monofilament sensory testing bilateral. Positive of subjective pain and numbness on both feet and leg. Orthopedic: Flat arch with disfigured lesser digits.  Assessment: Onychomycosis x 10. Peripheral neuropathy. Neuropathy pain in foot and leg.  Digital deformities, congenital.  Plan: All nails debrided. Both feet measured for diabetic shoes. Patient is to turn in signed PCP authorization form for diabetic shoes. Return in 3 month.

## 2017-09-19 DIAGNOSIS — I8312 Varicose veins of left lower extremity with inflammation: Secondary | ICD-10-CM | POA: Diagnosis not present

## 2017-09-19 DIAGNOSIS — I8311 Varicose veins of right lower extremity with inflammation: Secondary | ICD-10-CM | POA: Diagnosis not present

## 2017-09-19 DIAGNOSIS — I83893 Varicose veins of bilateral lower extremities with other complications: Secondary | ICD-10-CM | POA: Diagnosis not present

## 2017-09-22 DIAGNOSIS — Z794 Long term (current) use of insulin: Secondary | ICD-10-CM | POA: Diagnosis not present

## 2017-09-22 DIAGNOSIS — R600 Localized edema: Secondary | ICD-10-CM | POA: Diagnosis not present

## 2017-09-22 DIAGNOSIS — M109 Gout, unspecified: Secondary | ICD-10-CM | POA: Diagnosis not present

## 2017-09-22 DIAGNOSIS — I4891 Unspecified atrial fibrillation: Secondary | ICD-10-CM | POA: Diagnosis not present

## 2017-09-22 DIAGNOSIS — M25532 Pain in left wrist: Secondary | ICD-10-CM | POA: Diagnosis not present

## 2017-09-22 DIAGNOSIS — Z6841 Body Mass Index (BMI) 40.0 and over, adult: Secondary | ICD-10-CM | POA: Diagnosis not present

## 2017-09-22 DIAGNOSIS — E119 Type 2 diabetes mellitus without complications: Secondary | ICD-10-CM | POA: Diagnosis not present

## 2017-09-22 DIAGNOSIS — E785 Hyperlipidemia, unspecified: Secondary | ICD-10-CM | POA: Diagnosis not present

## 2017-09-22 DIAGNOSIS — I5020 Unspecified systolic (congestive) heart failure: Secondary | ICD-10-CM | POA: Diagnosis not present

## 2017-09-22 DIAGNOSIS — M10032 Idiopathic gout, left wrist: Secondary | ICD-10-CM | POA: Diagnosis not present

## 2017-09-22 DIAGNOSIS — Z79899 Other long term (current) drug therapy: Secondary | ICD-10-CM | POA: Diagnosis not present

## 2017-09-22 DIAGNOSIS — I110 Hypertensive heart disease with heart failure: Secondary | ICD-10-CM | POA: Diagnosis not present

## 2017-10-05 DIAGNOSIS — I8312 Varicose veins of left lower extremity with inflammation: Secondary | ICD-10-CM | POA: Diagnosis not present

## 2017-10-05 DIAGNOSIS — I8311 Varicose veins of right lower extremity with inflammation: Secondary | ICD-10-CM | POA: Diagnosis not present

## 2017-10-07 DIAGNOSIS — I129 Hypertensive chronic kidney disease with stage 1 through stage 4 chronic kidney disease, or unspecified chronic kidney disease: Secondary | ICD-10-CM | POA: Diagnosis not present

## 2017-10-07 DIAGNOSIS — I4891 Unspecified atrial fibrillation: Secondary | ICD-10-CM | POA: Diagnosis not present

## 2017-10-07 DIAGNOSIS — I5020 Unspecified systolic (congestive) heart failure: Secondary | ICD-10-CM | POA: Diagnosis not present

## 2017-10-07 DIAGNOSIS — N183 Chronic kidney disease, stage 3 (moderate): Secondary | ICD-10-CM | POA: Diagnosis not present

## 2017-10-11 ENCOUNTER — Other Ambulatory Visit (HOSPITAL_COMMUNITY): Payer: Self-pay | Admitting: Internal Medicine

## 2017-10-11 DIAGNOSIS — I428 Other cardiomyopathies: Secondary | ICD-10-CM | POA: Diagnosis not present

## 2017-10-11 DIAGNOSIS — E785 Hyperlipidemia, unspecified: Secondary | ICD-10-CM | POA: Diagnosis not present

## 2017-10-11 DIAGNOSIS — E039 Hypothyroidism, unspecified: Secondary | ICD-10-CM | POA: Diagnosis not present

## 2017-10-11 DIAGNOSIS — E7211 Homocystinuria: Secondary | ICD-10-CM | POA: Diagnosis not present

## 2017-10-11 DIAGNOSIS — E119 Type 2 diabetes mellitus without complications: Secondary | ICD-10-CM | POA: Diagnosis not present

## 2017-10-11 DIAGNOSIS — M109 Gout, unspecified: Secondary | ICD-10-CM | POA: Diagnosis not present

## 2017-10-11 DIAGNOSIS — I10 Essential (primary) hypertension: Secondary | ICD-10-CM | POA: Diagnosis not present

## 2017-10-11 DIAGNOSIS — I509 Heart failure, unspecified: Secondary | ICD-10-CM | POA: Diagnosis not present

## 2017-10-11 DIAGNOSIS — N183 Chronic kidney disease, stage 3 (moderate): Secondary | ICD-10-CM | POA: Diagnosis not present

## 2017-10-11 DIAGNOSIS — I4891 Unspecified atrial fibrillation: Secondary | ICD-10-CM | POA: Diagnosis not present

## 2017-10-11 DIAGNOSIS — I872 Venous insufficiency (chronic) (peripheral): Secondary | ICD-10-CM | POA: Diagnosis not present

## 2017-11-17 DIAGNOSIS — I83893 Varicose veins of bilateral lower extremities with other complications: Secondary | ICD-10-CM | POA: Diagnosis not present

## 2017-11-17 DIAGNOSIS — I8311 Varicose veins of right lower extremity with inflammation: Secondary | ICD-10-CM | POA: Diagnosis not present

## 2017-11-17 DIAGNOSIS — I8312 Varicose veins of left lower extremity with inflammation: Secondary | ICD-10-CM | POA: Diagnosis not present

## 2017-11-17 DIAGNOSIS — I83813 Varicose veins of bilateral lower extremities with pain: Secondary | ICD-10-CM | POA: Diagnosis not present

## 2017-11-23 ENCOUNTER — Ambulatory Visit: Payer: Medicare Other | Admitting: Podiatry

## 2017-12-01 ENCOUNTER — Other Ambulatory Visit (HOSPITAL_COMMUNITY): Payer: Self-pay | Admitting: Internal Medicine

## 2018-01-02 DIAGNOSIS — I8312 Varicose veins of left lower extremity with inflammation: Secondary | ICD-10-CM | POA: Diagnosis not present

## 2018-01-02 DIAGNOSIS — I8311 Varicose veins of right lower extremity with inflammation: Secondary | ICD-10-CM | POA: Diagnosis not present

## 2018-01-02 DIAGNOSIS — I890 Lymphedema, not elsewhere classified: Secondary | ICD-10-CM | POA: Diagnosis not present

## 2018-01-02 DIAGNOSIS — I83893 Varicose veins of bilateral lower extremities with other complications: Secondary | ICD-10-CM | POA: Diagnosis not present

## 2018-01-05 ENCOUNTER — Other Ambulatory Visit (HOSPITAL_COMMUNITY): Payer: Self-pay | Admitting: Internal Medicine

## 2018-01-10 DIAGNOSIS — I4891 Unspecified atrial fibrillation: Secondary | ICD-10-CM | POA: Diagnosis not present

## 2018-01-10 DIAGNOSIS — I428 Other cardiomyopathies: Secondary | ICD-10-CM | POA: Diagnosis not present

## 2018-01-10 DIAGNOSIS — E119 Type 2 diabetes mellitus without complications: Secondary | ICD-10-CM | POA: Diagnosis not present

## 2018-01-10 DIAGNOSIS — E785 Hyperlipidemia, unspecified: Secondary | ICD-10-CM | POA: Diagnosis not present

## 2018-01-10 DIAGNOSIS — E7211 Homocystinuria: Secondary | ICD-10-CM | POA: Diagnosis not present

## 2018-01-10 DIAGNOSIS — E039 Hypothyroidism, unspecified: Secondary | ICD-10-CM | POA: Diagnosis not present

## 2018-01-10 DIAGNOSIS — I509 Heart failure, unspecified: Secondary | ICD-10-CM | POA: Diagnosis not present

## 2018-01-10 DIAGNOSIS — I872 Venous insufficiency (chronic) (peripheral): Secondary | ICD-10-CM | POA: Diagnosis not present

## 2018-01-10 DIAGNOSIS — I10 Essential (primary) hypertension: Secondary | ICD-10-CM | POA: Diagnosis not present

## 2018-01-10 DIAGNOSIS — M109 Gout, unspecified: Secondary | ICD-10-CM | POA: Diagnosis not present

## 2018-01-10 DIAGNOSIS — N183 Chronic kidney disease, stage 3 (moderate): Secondary | ICD-10-CM | POA: Diagnosis not present

## 2018-01-17 DIAGNOSIS — N183 Chronic kidney disease, stage 3 (moderate): Secondary | ICD-10-CM | POA: Diagnosis not present

## 2018-01-17 DIAGNOSIS — N2581 Secondary hyperparathyroidism of renal origin: Secondary | ICD-10-CM | POA: Diagnosis not present

## 2018-01-17 DIAGNOSIS — D631 Anemia in chronic kidney disease: Secondary | ICD-10-CM | POA: Diagnosis not present

## 2018-01-17 DIAGNOSIS — I129 Hypertensive chronic kidney disease with stage 1 through stage 4 chronic kidney disease, or unspecified chronic kidney disease: Secondary | ICD-10-CM | POA: Diagnosis not present

## 2018-01-17 DIAGNOSIS — I5020 Unspecified systolic (congestive) heart failure: Secondary | ICD-10-CM | POA: Diagnosis not present

## 2018-01-26 DIAGNOSIS — N183 Chronic kidney disease, stage 3 (moderate): Secondary | ICD-10-CM | POA: Diagnosis not present

## 2018-01-26 DIAGNOSIS — E7211 Homocystinuria: Secondary | ICD-10-CM | POA: Diagnosis not present

## 2018-01-26 DIAGNOSIS — E119 Type 2 diabetes mellitus without complications: Secondary | ICD-10-CM | POA: Diagnosis not present

## 2018-01-26 DIAGNOSIS — M109 Gout, unspecified: Secondary | ICD-10-CM | POA: Diagnosis not present

## 2018-01-26 DIAGNOSIS — I872 Venous insufficiency (chronic) (peripheral): Secondary | ICD-10-CM | POA: Diagnosis not present

## 2018-01-26 DIAGNOSIS — I10 Essential (primary) hypertension: Secondary | ICD-10-CM | POA: Diagnosis not present

## 2018-01-26 DIAGNOSIS — E039 Hypothyroidism, unspecified: Secondary | ICD-10-CM | POA: Diagnosis not present

## 2018-01-26 DIAGNOSIS — I4891 Unspecified atrial fibrillation: Secondary | ICD-10-CM | POA: Diagnosis not present

## 2018-01-26 DIAGNOSIS — I509 Heart failure, unspecified: Secondary | ICD-10-CM | POA: Diagnosis not present

## 2018-01-26 DIAGNOSIS — E785 Hyperlipidemia, unspecified: Secondary | ICD-10-CM | POA: Diagnosis not present

## 2018-01-26 DIAGNOSIS — I428 Other cardiomyopathies: Secondary | ICD-10-CM | POA: Diagnosis not present

## 2018-01-27 DIAGNOSIS — I5042 Chronic combined systolic (congestive) and diastolic (congestive) heart failure: Secondary | ICD-10-CM | POA: Diagnosis not present

## 2018-01-27 DIAGNOSIS — I482 Chronic atrial fibrillation: Secondary | ICD-10-CM | POA: Diagnosis not present

## 2018-01-27 DIAGNOSIS — Z6841 Body Mass Index (BMI) 40.0 and over, adult: Secondary | ICD-10-CM | POA: Diagnosis not present

## 2018-01-27 DIAGNOSIS — I428 Other cardiomyopathies: Secondary | ICD-10-CM | POA: Diagnosis not present

## 2018-02-07 ENCOUNTER — Other Ambulatory Visit (HOSPITAL_COMMUNITY): Payer: Self-pay | Admitting: Internal Medicine

## 2018-02-08 DIAGNOSIS — I428 Other cardiomyopathies: Secondary | ICD-10-CM | POA: Diagnosis not present

## 2018-02-23 DIAGNOSIS — I4891 Unspecified atrial fibrillation: Secondary | ICD-10-CM | POA: Diagnosis not present

## 2018-02-23 DIAGNOSIS — N183 Chronic kidney disease, stage 3 (moderate): Secondary | ICD-10-CM | POA: Diagnosis not present

## 2018-02-23 DIAGNOSIS — N2581 Secondary hyperparathyroidism of renal origin: Secondary | ICD-10-CM | POA: Diagnosis not present

## 2018-02-23 DIAGNOSIS — I5020 Unspecified systolic (congestive) heart failure: Secondary | ICD-10-CM | POA: Diagnosis not present

## 2018-02-23 DIAGNOSIS — I129 Hypertensive chronic kidney disease with stage 1 through stage 4 chronic kidney disease, or unspecified chronic kidney disease: Secondary | ICD-10-CM | POA: Diagnosis not present

## 2018-02-23 DIAGNOSIS — D631 Anemia in chronic kidney disease: Secondary | ICD-10-CM | POA: Diagnosis not present

## 2018-03-07 DIAGNOSIS — E119 Type 2 diabetes mellitus without complications: Secondary | ICD-10-CM | POA: Diagnosis not present

## 2018-03-07 DIAGNOSIS — I428 Other cardiomyopathies: Secondary | ICD-10-CM | POA: Diagnosis not present

## 2018-03-07 DIAGNOSIS — I509 Heart failure, unspecified: Secondary | ICD-10-CM | POA: Diagnosis not present

## 2018-03-07 DIAGNOSIS — N183 Chronic kidney disease, stage 3 (moderate): Secondary | ICD-10-CM | POA: Diagnosis not present

## 2018-03-07 DIAGNOSIS — M109 Gout, unspecified: Secondary | ICD-10-CM | POA: Diagnosis not present

## 2018-03-07 DIAGNOSIS — I4891 Unspecified atrial fibrillation: Secondary | ICD-10-CM | POA: Diagnosis not present

## 2018-03-07 DIAGNOSIS — E7211 Homocystinuria: Secondary | ICD-10-CM | POA: Diagnosis not present

## 2018-03-07 DIAGNOSIS — I872 Venous insufficiency (chronic) (peripheral): Secondary | ICD-10-CM | POA: Diagnosis not present

## 2018-03-07 DIAGNOSIS — E785 Hyperlipidemia, unspecified: Secondary | ICD-10-CM | POA: Diagnosis not present

## 2018-03-07 DIAGNOSIS — I10 Essential (primary) hypertension: Secondary | ICD-10-CM | POA: Diagnosis not present

## 2018-03-07 DIAGNOSIS — E039 Hypothyroidism, unspecified: Secondary | ICD-10-CM | POA: Diagnosis not present

## 2018-03-15 ENCOUNTER — Other Ambulatory Visit (HOSPITAL_COMMUNITY): Payer: Self-pay | Admitting: Internal Medicine

## 2018-04-13 ENCOUNTER — Telehealth: Payer: Self-pay

## 2018-04-13 NOTE — Telephone Encounter (Signed)
Called and left VM advising that diabetic shoes are ready for pick up. Previously pt did not want to pick up shoes until insurance had paid for them.

## 2018-04-22 DIAGNOSIS — M79601 Pain in right arm: Secondary | ICD-10-CM | POA: Diagnosis not present

## 2018-04-22 DIAGNOSIS — Z7401 Bed confinement status: Secondary | ICD-10-CM | POA: Diagnosis not present

## 2018-04-22 DIAGNOSIS — R52 Pain, unspecified: Secondary | ICD-10-CM | POA: Diagnosis not present

## 2018-04-22 DIAGNOSIS — M1000 Idiopathic gout, unspecified site: Secondary | ICD-10-CM | POA: Diagnosis not present

## 2018-04-22 DIAGNOSIS — M2550 Pain in unspecified joint: Secondary | ICD-10-CM | POA: Diagnosis not present

## 2018-05-05 ENCOUNTER — Other Ambulatory Visit (HOSPITAL_COMMUNITY): Payer: Self-pay | Admitting: Internal Medicine

## 2018-05-06 ENCOUNTER — Other Ambulatory Visit: Payer: Self-pay

## 2018-05-06 ENCOUNTER — Emergency Department (HOSPITAL_BASED_OUTPATIENT_CLINIC_OR_DEPARTMENT_OTHER)
Admission: EM | Admit: 2018-05-06 | Discharge: 2018-05-06 | Disposition: A | Discharge status: Home / Self Care | Payer: Medicare Other | Attending: Emergency Medicine | Admitting: Emergency Medicine

## 2018-05-06 ENCOUNTER — Encounter (HOSPITAL_BASED_OUTPATIENT_CLINIC_OR_DEPARTMENT_OTHER): Payer: Self-pay | Admitting: Emergency Medicine

## 2018-05-06 DIAGNOSIS — X58XXXA Exposure to other specified factors, initial encounter: Secondary | ICD-10-CM | POA: Insufficient documentation

## 2018-05-06 DIAGNOSIS — Y939 Activity, unspecified: Secondary | ICD-10-CM | POA: Diagnosis not present

## 2018-05-06 DIAGNOSIS — Z79899 Other long term (current) drug therapy: Secondary | ICD-10-CM | POA: Diagnosis not present

## 2018-05-06 DIAGNOSIS — I5042 Chronic combined systolic (congestive) and diastolic (congestive) heart failure: Secondary | ICD-10-CM | POA: Diagnosis not present

## 2018-05-06 DIAGNOSIS — E119 Type 2 diabetes mellitus without complications: Secondary | ICD-10-CM | POA: Insufficient documentation

## 2018-05-06 DIAGNOSIS — I2510 Atherosclerotic heart disease of native coronary artery without angina pectoris: Secondary | ICD-10-CM | POA: Insufficient documentation

## 2018-05-06 DIAGNOSIS — Y999 Unspecified external cause status: Secondary | ICD-10-CM | POA: Insufficient documentation

## 2018-05-06 DIAGNOSIS — Z955 Presence of coronary angioplasty implant and graft: Secondary | ICD-10-CM | POA: Insufficient documentation

## 2018-05-06 DIAGNOSIS — Z794 Long term (current) use of insulin: Secondary | ICD-10-CM | POA: Diagnosis not present

## 2018-05-06 DIAGNOSIS — Y929 Unspecified place or not applicable: Secondary | ICD-10-CM | POA: Insufficient documentation

## 2018-05-06 DIAGNOSIS — I110 Hypertensive heart disease with heart failure: Secondary | ICD-10-CM | POA: Insufficient documentation

## 2018-05-06 DIAGNOSIS — S80821A Blister (nonthermal), right lower leg, initial encounter: Secondary | ICD-10-CM

## 2018-05-06 DIAGNOSIS — M79604 Pain in right leg: Secondary | ICD-10-CM | POA: Diagnosis not present

## 2018-05-06 MED ORDER — DOXYCYCLINE HYCLATE 100 MG PO CAPS: 100.0000 mg | ORAL_CAPSULE | Freq: Two times a day (BID) | ORAL | 0 refills | Status: AC

## 2018-05-06 NOTE — ED Triage Notes (Signed)
Pt to ED via EMS with c/o large blister to RLE that he noticed today.

## 2018-05-06 NOTE — ED Provider Notes (Signed)
[facility] EMERGENCY DEPARTMENT Provider Note   CSN: [id number] Arrival date & time: 05/06/18  1036     History   Chief Complaint Chief Complaint  Patient presents with  . Wound Check    HPI [name] is a 67 y.o. male.  HPI   67 year old male presenting for evaluation of a blister to his right lower leg.  First noticed yesterday.  Denies any trauma.  No new exposures.  Some mild discomfort at the site.  No fevers or chills.  Has been drainage from the blister.  No other acute complaints.  Past Medical History:  Diagnosis Date  . Acute and chronic respiratory failure (acute-on-chronic) (HCC) 02/07/2013  . Atrial fibrillation (HCC) 08/26/2009   takes Pradaxa daily  . BPH (benign prostatic hyperplasia) 07/23/2013  . CHF (congestive heart failure) (HCC)    takes Lasix daily  . Chronic diastolic heart failure (HCC)    Echo 8.25.2011:  Mod LVH; EF 50%; Mod LAE; RV dilation and ? dysfxn; mild RAE;   . Chronic systolic heart failure (HCC) 08/26/2009     Qualifier: Diagnosis of  By: Huntley Dec, Scott     . Coronary artery disease    Cardiac catheterization August 06, 2009, showed nonobstructive coronary artery disease with distal diabetic vasculopathy. He had markedly elevated LV filling pressures and pulmonary venous hypertension with normal pulmonary vascular resistance suggesting he will normalize his pulmonary pressures with adequate diuresis.   . CORONARY ARTERY DISEASE, S/P PTCA 10/08/2009   Qualifier: Diagnosis of  By: Trevor Iha, RN, Heather    . Depression    denies on 09/30/2014; "just when I first went into nursing home"  . Dyslipidemia   . Enlarged prostate    takes Tamsulosin and Proscar daily  . Essential hypertension, benign    takes Lisinopril and Clonidine daily  . Hyperkalemia 02/07/2013  . HYPERTHYROIDISM 02/23/2011   Qualifier: Diagnosis of  By: Daphine Deutscher FNP, Zena Amos    . Joint pain   . OSA on CPAP    "wear mask sometimes" (09/30/2014)  . Other  primary cardiomyopathies   . Peripheral neuropathy    takes gabapentin daily  . Renal insufficiency   . SLEEP APNEA 08/26/2009   Annotation: diagnosed by overnight oximetry in hospital in Aug. 2010 does not want to wear CPAP Qualifier: Diagnosis of  By: Huntley Dec, Scott    . Type II diabetes mellitus (HCC)    takes Metformin daily and Lantus as bedtime  . Urinary frequency    d/t being on Lasix  . Urinary urgency     Patient Active Problem List   Diagnosis Date Noted  . Pain in lower limb 04/16/2015  . Onychomycosis 01/15/2015  . Post-operative state 09/30/2014  . 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 Constipation 11/23/2013  . Type II or unspecified type diabetes mellitus with neurological manifestations, not stated as uncontrolled(250.60) 09/14/2013  . BPH (benign prostatic hyperplasia) 07/23/2013  . Urinary incontinence 07/23/2013  . Depression 03/13/2013  . Chronic pain 03/13/2013  . Anemia 03/13/2013  . GERD (gastroesophageal reflux disease) 03/13/2013  . Encephalopathy, metabolic 02/10/2013  . Chronic respiratory failure (HCC) 02/07/2013  . Bradycardia 02/07/2013  . Chronic diastolic heart failure (HCC) 02/07/2013  . Acute and chronic respiratory failure (acute-on-chronic) (HCC) 02/07/2013  . Hyperkalemia 02/07/2013  . SHOULDER PAIN, BILATERAL 02/22/2011  . PARONYCHIA, FINGER 12/10/2009  . CHEST PAIN 10/07/2009  . COUGH 09/23/2009  . DYSLIPIDEMIA 08/26/2009  . Morbid obesity (HCC) 08/26/2009  . Essential hypertension, benign  08/26/2009  . Coronary Artery Disease 08/26/2009  . Atrial fibrillation, chronic (HCC) 08/26/2009  . RENAL INSUFFICIENCY 08/26/2009  . SLEEP APNEA 08/26/2009    Past Surgical History:  Procedure Laterality Date  . CARDIAC CATHETERIZATION  07/2009   Hattie Perch 09/03/2009  . MULTIPLE EXTRACTIONS WITH ALVEOLOPLASTY N/A 09/30/2014   Procedure: MULTIPLE EXTRACION WITH ALVEOLOPLASTY;  Surgeon: [name], DDS;  Location: [facility] OR;  Service:  Oral Surgery;  Laterality: N/A;  . MULTIPLE TOOTH EXTRACTIONS  09/30/2014   # 2, 3, 6, 7, 8, 9, 10, 11,12, 13, 14, 15, 16, 19, 20, 21, 22, 28, 30, 31  w/alveoloplasty        Home Medications    Prior to Admission medications   Medication Sig Start Date End Date Taking? Authorizing Provider  carvedilol (COREG) 12.5 MG tablet Take 12.5 mg by mouth 2 (two) times daily with a meal.   Yes [provider]  cetirizine (ZYRTEC) 10 MG tablet Take 10 mg by mouth daily.   Yes [provider]  glimepiride (AMARYL) 4 MG tablet Take 4 mg by mouth daily with breakfast.   Yes [provider]  insulin aspart protamine- aspart (NOVOLOG MIX 70/30) (70-30) 100 UNIT/ML injection Inject 25 Units into the skin 2 (two) times daily with a meal.   Yes [provider]  isosorbide-hydrALAZINE (BIDIL) 20-37.5 MG tablet Take by mouth 3 (three) times daily.   Yes [provider]  levothyroxine (SYNTHROID, LEVOTHROID) 125 MCG tablet Take 125 mcg by mouth daily before breakfast.   Yes [provider]  vitamin C (ASCORBIC ACID) 500 MG tablet Take 500 mg by mouth daily.   Yes [provider]  acetaminophen (TYLENOL) 325 MG tablet 2 by mouth every 4 hours for headache or fever > 100, if fever 101.5 contact MD. Do not exceed 4 gm of tylenol in 24 hours    [provider]  atorvastatin (LIPITOR) 80 MG tablet Take 1 tablet (80 mg total) by mouth daily. Please call for OV [phone] 05/05/18   Bensimhon, Bevelyn Buckles, MD  Cholecalciferol 1000 UNITS tablet Take 1,000 Units by mouth daily.    [provider]  divalproex (DEPAKOTE SPRINKLE) 125 MG capsule Take 1 by mouth every night at bedtime to stabilize depressive mood 08/11/14   Sharon Seller, NP  docusate sodium (COLACE) 100 MG capsule Take 1 capsule (100 mg total) by mouth 2 (two) times daily. 10/01/14   Ocie Doyne, DDS  doxycycline (VIBRAMYCIN) 100 MG capsule Take 1 capsule (100 mg total) by  mouth 2 (two) times daily. 05/06/18   Raeford Razor, MD  finasteride (PROSCAR) 5 MG tablet Take 5 mg by mouth daily. For BPH    [provider]  furosemide (LASIX) 40 MG tablet Take 60 mg by mouth 2 (two) times daily.  02/15/13   Marinda Elk, MD  gabapentin (NEURONTIN) 300 MG capsule Take 300 mg by mouth 2 (two) times daily.     [provider]  insulin glargine (LANTUS) 100 UNIT/ML injection Inject 25 Units into the skin at bedtime.     [provider]  lidocaine (LIDODERM) 5 % 0.05 patches. 1/2 patch topically on both shins daily and discard patch within 12 hours for Osteoarthritis    [provider]  lisinopril (PRINIVIL,ZESTRIL) 40 MG tablet Take 40 mg by mouth daily. For hypertension 02/15/13   Marinda Elk, MD  metFORMIN (GLUCOPHAGE) 1000 MG tablet Take 1,000 mg by mouth 2 (two) times daily with a  meal. For diabetes    [provider]  oxyCODONE-acetaminophen (PERCOCET) 5-325 MG per tablet Take 1-2 tablets by mouth every 4 (four) hours as needed for severe pain. 09/30/14   Ocie Doyne, DDS  potassium chloride SA (K-DUR,KLOR-CON) 20 MEQ tablet Take 40 mEq by mouth 2 (two) times daily. For Hypopotassemic 02/15/13   Marinda Elk, MD  sodium chloride (OCEAN) 0.65 % nasal spray Place 1 spray into the nose 2 (two) times daily. For dry nares    [provider]  spironolactone (ALDACTONE) 25 MG tablet Take 25 mg by mouth 2 (two) times daily. For hypertension  02/15/13   Marinda Elk, MD  tamsulosin (FLOMAX) 0.4 MG CAPS capsule Take 0.4 mg by mouth daily. For BPH    [provider]    Family History Family History  Problem Relation Age of Onset  . Hypertension Unknown        Aunts    Social History Social History   Tobacco Use  . Smoking status: Never Smoker  . Smokeless tobacco: Never Used  Substance Use Topics  . Alcohol use: No    Alcohol/week: 0.0 oz  . Drug use: No     Allergies     Patient has no known allergies.   Review of Systems Review of Systems  All systems reviewed and negative, other than as noted in HPI.  Physical Exam Updated Vital Signs BP (!) 134/103 (BP Location: Right Arm)   Pulse 82   Temp 97.6 F (36.4 C) (Oral)   Resp 20   Ht 5\' 4"  (1.626 m)   SpO2 97%   BMI 53.21 kg/m   Physical Exam  Constitutional: He appears well-developed and well-nourished. No distress.  Laying in bed.  No acute distress.  Obese.  HENT:  Head: Normocephalic and atraumatic.  Eyes: Conjunctivae are normal. Right eye exhibits no discharge. Left eye exhibits no discharge.  Neck: Neck supple.  Cardiovascular: Normal rate, regular rhythm and normal heart sounds. Exam reveals no gallop and no friction rub.  No murmur heard. Pulmonary/Chest: Effort normal and breath sounds normal. No respiratory distress.  Abdominal: Soft. He exhibits no distension. There is no tenderness.  Musculoskeletal: He exhibits edema. He exhibits no tenderness.  Lower extremity is roughly symmetric as compared to right shoulder.  Chronic stasis changes to bilateral shins.  Over the mid aspect of the right anterior shin there is a large flaccid blister.  The inferior aspect is draining mild amount of serous appearing fluid.  Extending from the base of the blister there is a few centimeters of increased erythema as compared to the surrounding skin.  Increased warmth.  Tender to palpation.  Neurological: He is alert.  Skin: Skin is warm and dry.  Psychiatric: He has a normal mood and affect. His behavior is normal. Thought content normal.  Nursing note and vitals reviewed.    ED Treatments / Results  Labs (all labs ordered are listed, but only abnormal results are displayed) Labs Reviewed - No data to display  EKG None  Radiology No results found.  Procedures Procedures (including critical care time)  Medications Ordered in ED Medications - No data to display   Initial Impression /  Assessment and Plan / ED Course  I have reviewed the triage vital signs and the nursing notes.  Pertinent labs & imaging results that were available during my care of the patient were reviewed by me and considered in my medical decision making (see chart for details).  67 year old male with a large flaccid blister to right lower extremity.  Appears to be filled with serous appearing fluid.  He has multiple chronic underlying medical illnesses but he really has no other acute complaints associated with this.  The base of the blister does have some erythema and increased warmth in the setting of chronic venous stasis changes bilaterally.  Will cover for possible infectious etiology.  Continued wound care and return precautions were discussed.  Final Clinical Impressions(s) / ED Diagnoses   Final diagnoses:  Blister of right lower leg, initial encounter    ED Discharge Orders        Ordered    doxycycline (VIBRAMYCIN) 100 MG capsule  2 times daily     05/06/18 1136       Raeford Razor, MD 05/06/18 1143

## 2018-05-11 DIAGNOSIS — I872 Venous insufficiency (chronic) (peripheral): Secondary | ICD-10-CM | POA: Diagnosis not present

## 2018-05-11 DIAGNOSIS — I509 Heart failure, unspecified: Secondary | ICD-10-CM | POA: Diagnosis not present

## 2018-05-11 DIAGNOSIS — R52 Pain, unspecified: Secondary | ICD-10-CM | POA: Diagnosis not present

## 2018-05-11 DIAGNOSIS — I428 Other cardiomyopathies: Secondary | ICD-10-CM | POA: Diagnosis not present

## 2018-05-11 DIAGNOSIS — N183 Chronic kidney disease, stage 3 (moderate): Secondary | ICD-10-CM | POA: Diagnosis not present

## 2018-05-11 DIAGNOSIS — E785 Hyperlipidemia, unspecified: Secondary | ICD-10-CM | POA: Diagnosis not present

## 2018-05-11 DIAGNOSIS — E039 Hypothyroidism, unspecified: Secondary | ICD-10-CM | POA: Diagnosis not present

## 2018-05-11 DIAGNOSIS — I4891 Unspecified atrial fibrillation: Secondary | ICD-10-CM | POA: Diagnosis not present

## 2018-05-11 DIAGNOSIS — I10 Essential (primary) hypertension: Secondary | ICD-10-CM | POA: Diagnosis not present

## 2018-05-11 DIAGNOSIS — M109 Gout, unspecified: Secondary | ICD-10-CM | POA: Diagnosis not present

## 2018-05-11 DIAGNOSIS — T148XXA Other injury of unspecified body region, initial encounter: Secondary | ICD-10-CM | POA: Diagnosis not present

## 2018-05-11 DIAGNOSIS — E119 Type 2 diabetes mellitus without complications: Secondary | ICD-10-CM | POA: Diagnosis not present

## 2018-05-23 DIAGNOSIS — I4891 Unspecified atrial fibrillation: Secondary | ICD-10-CM | POA: Diagnosis not present

## 2018-05-23 DIAGNOSIS — E119 Type 2 diabetes mellitus without complications: Secondary | ICD-10-CM | POA: Diagnosis not present

## 2018-05-23 DIAGNOSIS — T148XXA Other injury of unspecified body region, initial encounter: Secondary | ICD-10-CM | POA: Diagnosis not present

## 2018-05-23 DIAGNOSIS — M109 Gout, unspecified: Secondary | ICD-10-CM | POA: Diagnosis not present

## 2018-05-23 DIAGNOSIS — R52 Pain, unspecified: Secondary | ICD-10-CM | POA: Diagnosis not present

## 2018-05-23 DIAGNOSIS — E039 Hypothyroidism, unspecified: Secondary | ICD-10-CM | POA: Diagnosis not present

## 2018-05-23 DIAGNOSIS — I509 Heart failure, unspecified: Secondary | ICD-10-CM | POA: Diagnosis not present

## 2018-05-23 DIAGNOSIS — I10 Essential (primary) hypertension: Secondary | ICD-10-CM | POA: Diagnosis not present

## 2018-05-23 DIAGNOSIS — N183 Chronic kidney disease, stage 3 (moderate): Secondary | ICD-10-CM | POA: Diagnosis not present

## 2018-05-23 DIAGNOSIS — I872 Venous insufficiency (chronic) (peripheral): Secondary | ICD-10-CM | POA: Diagnosis not present

## 2018-05-23 DIAGNOSIS — E785 Hyperlipidemia, unspecified: Secondary | ICD-10-CM | POA: Diagnosis not present

## 2018-05-23 DIAGNOSIS — I428 Other cardiomyopathies: Secondary | ICD-10-CM | POA: Diagnosis not present

## 2018-05-26 DIAGNOSIS — I5020 Unspecified systolic (congestive) heart failure: Secondary | ICD-10-CM | POA: Diagnosis not present

## 2018-05-26 DIAGNOSIS — N183 Chronic kidney disease, stage 3 (moderate): Secondary | ICD-10-CM | POA: Diagnosis not present

## 2018-05-26 DIAGNOSIS — I129 Hypertensive chronic kidney disease with stage 1 through stage 4 chronic kidney disease, or unspecified chronic kidney disease: Secondary | ICD-10-CM | POA: Diagnosis not present

## 2018-05-26 DIAGNOSIS — I4891 Unspecified atrial fibrillation: Secondary | ICD-10-CM | POA: Diagnosis not present

## 2018-06-05 DIAGNOSIS — I890 Lymphedema, not elsewhere classified: Secondary | ICD-10-CM | POA: Diagnosis not present

## 2018-06-05 DIAGNOSIS — I8311 Varicose veins of right lower extremity with inflammation: Secondary | ICD-10-CM | POA: Diagnosis not present

## 2018-06-05 DIAGNOSIS — I83893 Varicose veins of bilateral lower extremities with other complications: Secondary | ICD-10-CM | POA: Diagnosis not present

## 2018-06-05 DIAGNOSIS — I8312 Varicose veins of left lower extremity with inflammation: Secondary | ICD-10-CM | POA: Diagnosis not present

## 2018-06-15 DIAGNOSIS — I10 Essential (primary) hypertension: Secondary | ICD-10-CM | POA: Diagnosis not present

## 2018-06-15 DIAGNOSIS — I4891 Unspecified atrial fibrillation: Secondary | ICD-10-CM | POA: Diagnosis not present

## 2018-06-15 DIAGNOSIS — M109 Gout, unspecified: Secondary | ICD-10-CM | POA: Diagnosis not present

## 2018-06-15 DIAGNOSIS — T148XXA Other injury of unspecified body region, initial encounter: Secondary | ICD-10-CM | POA: Diagnosis not present

## 2018-06-15 DIAGNOSIS — I428 Other cardiomyopathies: Secondary | ICD-10-CM | POA: Diagnosis not present

## 2018-06-15 DIAGNOSIS — I872 Venous insufficiency (chronic) (peripheral): Secondary | ICD-10-CM | POA: Diagnosis not present

## 2018-06-15 DIAGNOSIS — I509 Heart failure, unspecified: Secondary | ICD-10-CM | POA: Diagnosis not present

## 2018-06-15 DIAGNOSIS — E119 Type 2 diabetes mellitus without complications: Secondary | ICD-10-CM | POA: Diagnosis not present

## 2018-06-15 DIAGNOSIS — Z125 Encounter for screening for malignant neoplasm of prostate: Secondary | ICD-10-CM | POA: Diagnosis not present

## 2018-06-15 DIAGNOSIS — N183 Chronic kidney disease, stage 3 (moderate): Secondary | ICD-10-CM | POA: Diagnosis not present

## 2018-06-15 DIAGNOSIS — E039 Hypothyroidism, unspecified: Secondary | ICD-10-CM | POA: Diagnosis not present

## 2018-06-15 DIAGNOSIS — E785 Hyperlipidemia, unspecified: Secondary | ICD-10-CM | POA: Diagnosis not present

## 2018-06-27 ENCOUNTER — Ambulatory Visit (INDEPENDENT_AMBULATORY_CARE_PROVIDER_SITE_OTHER): Payer: Medicare Other | Admitting: Podiatry

## 2018-06-27 DIAGNOSIS — B351 Tinea unguium: Secondary | ICD-10-CM

## 2018-06-27 DIAGNOSIS — M79672 Pain in left foot: Secondary | ICD-10-CM

## 2018-06-27 DIAGNOSIS — M79671 Pain in right foot: Secondary | ICD-10-CM | POA: Diagnosis not present

## 2018-06-27 NOTE — Patient Instructions (Signed)
Seen for hypertrophic nails. All nails debrided. Return in 3 months or as needed.  

## 2018-06-28 ENCOUNTER — Encounter: Payer: Self-pay | Admitting: Podiatry

## 2018-06-28 NOTE — Progress Notes (Signed)
Subjective: 67 y.o. year old male patient presents complaining of painful nails. Patient requests toe nails trimmed. Also came in to pick up his diabetic shoes. Patient is 5'4" and weighs 350 lbs. Patient is wheelchair bound.   Objective: Dermatologic: Thick yellow deformed nails x 10. Vascular: Pedal pulses are all palpable. Orthopedic: Flat foot with disfigured lesser toes. Neurologic: Positive for subjective pain and numbness on both feet and leg.  Assessment: Dystrophic mycotic nails x 10. Peripheral neuropathy. Congenital digital deformities. Painful toes.  Treatment: All mycotic nails debrided.  Diabetic shoe fitted and dispensed with instruction. Return in 3 months or sooner if needed.

## 2018-07-05 DIAGNOSIS — I482 Chronic atrial fibrillation: Secondary | ICD-10-CM | POA: Diagnosis not present

## 2018-07-05 DIAGNOSIS — Z6841 Body Mass Index (BMI) 40.0 and over, adult: Secondary | ICD-10-CM | POA: Diagnosis not present

## 2018-07-05 DIAGNOSIS — I428 Other cardiomyopathies: Secondary | ICD-10-CM | POA: Diagnosis not present

## 2018-07-05 DIAGNOSIS — I5042 Chronic combined systolic (congestive) and diastolic (congestive) heart failure: Secondary | ICD-10-CM | POA: Diagnosis not present

## 2018-08-17 DIAGNOSIS — E039 Hypothyroidism, unspecified: Secondary | ICD-10-CM | POA: Diagnosis not present

## 2018-08-17 DIAGNOSIS — I872 Venous insufficiency (chronic) (peripheral): Secondary | ICD-10-CM | POA: Diagnosis not present

## 2018-08-17 DIAGNOSIS — E119 Type 2 diabetes mellitus without complications: Secondary | ICD-10-CM | POA: Diagnosis not present

## 2018-08-17 DIAGNOSIS — I509 Heart failure, unspecified: Secondary | ICD-10-CM | POA: Diagnosis not present

## 2018-08-17 DIAGNOSIS — M109 Gout, unspecified: Secondary | ICD-10-CM | POA: Diagnosis not present

## 2018-08-17 DIAGNOSIS — E785 Hyperlipidemia, unspecified: Secondary | ICD-10-CM | POA: Diagnosis not present

## 2018-08-17 DIAGNOSIS — I10 Essential (primary) hypertension: Secondary | ICD-10-CM | POA: Diagnosis not present

## 2018-08-17 DIAGNOSIS — N183 Chronic kidney disease, stage 3 (moderate): Secondary | ICD-10-CM | POA: Diagnosis not present

## 2018-08-17 DIAGNOSIS — Z0000 Encounter for general adult medical examination without abnormal findings: Secondary | ICD-10-CM | POA: Diagnosis not present

## 2018-08-17 DIAGNOSIS — I428 Other cardiomyopathies: Secondary | ICD-10-CM | POA: Diagnosis not present

## 2018-08-17 DIAGNOSIS — Z125 Encounter for screening for malignant neoplasm of prostate: Secondary | ICD-10-CM | POA: Diagnosis not present

## 2018-08-17 DIAGNOSIS — I4891 Unspecified atrial fibrillation: Secondary | ICD-10-CM | POA: Diagnosis not present

## 2018-08-28 DIAGNOSIS — M79603 Pain in arm, unspecified: Secondary | ICD-10-CM | POA: Diagnosis not present

## 2018-08-28 DIAGNOSIS — M10022 Idiopathic gout, left elbow: Secondary | ICD-10-CM | POA: Diagnosis not present

## 2018-08-28 DIAGNOSIS — R609 Edema, unspecified: Secondary | ICD-10-CM | POA: Diagnosis not present

## 2018-08-28 DIAGNOSIS — R0902 Hypoxemia: Secondary | ICD-10-CM | POA: Diagnosis not present

## 2018-08-28 DIAGNOSIS — M109 Gout, unspecified: Secondary | ICD-10-CM | POA: Diagnosis not present

## 2018-08-28 DIAGNOSIS — M79622 Pain in left upper arm: Secondary | ICD-10-CM | POA: Diagnosis not present

## 2018-08-28 DIAGNOSIS — R52 Pain, unspecified: Secondary | ICD-10-CM | POA: Diagnosis not present

## 2018-09-12 ENCOUNTER — Encounter (HOSPITAL_BASED_OUTPATIENT_CLINIC_OR_DEPARTMENT_OTHER): Payer: Medicare Other | Attending: Internal Medicine

## 2018-09-12 DIAGNOSIS — I482 Chronic atrial fibrillation: Secondary | ICD-10-CM | POA: Insufficient documentation

## 2018-09-12 DIAGNOSIS — E1122 Type 2 diabetes mellitus with diabetic chronic kidney disease: Secondary | ICD-10-CM | POA: Insufficient documentation

## 2018-09-12 DIAGNOSIS — L97221 Non-pressure chronic ulcer of left calf limited to breakdown of skin: Secondary | ICD-10-CM | POA: Diagnosis not present

## 2018-09-12 DIAGNOSIS — N186 End stage renal disease: Secondary | ICD-10-CM | POA: Diagnosis not present

## 2018-09-12 DIAGNOSIS — G4733 Obstructive sleep apnea (adult) (pediatric): Secondary | ICD-10-CM | POA: Diagnosis not present

## 2018-09-12 DIAGNOSIS — J9610 Chronic respiratory failure, unspecified whether with hypoxia or hypercapnia: Secondary | ICD-10-CM | POA: Insufficient documentation

## 2018-09-12 DIAGNOSIS — E1140 Type 2 diabetes mellitus with diabetic neuropathy, unspecified: Secondary | ICD-10-CM | POA: Insufficient documentation

## 2018-09-12 DIAGNOSIS — I2510 Atherosclerotic heart disease of native coronary artery without angina pectoris: Secondary | ICD-10-CM | POA: Diagnosis not present

## 2018-09-12 DIAGNOSIS — L97829 Non-pressure chronic ulcer of other part of left lower leg with unspecified severity: Secondary | ICD-10-CM | POA: Diagnosis not present

## 2018-09-12 DIAGNOSIS — I132 Hypertensive heart and chronic kidney disease with heart failure and with stage 5 chronic kidney disease, or end stage renal disease: Secondary | ICD-10-CM | POA: Insufficient documentation

## 2018-09-12 DIAGNOSIS — E11622 Type 2 diabetes mellitus with other skin ulcer: Secondary | ICD-10-CM | POA: Insufficient documentation

## 2018-09-12 DIAGNOSIS — I509 Heart failure, unspecified: Secondary | ICD-10-CM | POA: Insufficient documentation

## 2018-09-12 DIAGNOSIS — I890 Lymphedema, not elsewhere classified: Secondary | ICD-10-CM | POA: Insufficient documentation

## 2018-09-12 DIAGNOSIS — S81802A Unspecified open wound, left lower leg, initial encounter: Secondary | ICD-10-CM | POA: Diagnosis not present

## 2018-09-18 DIAGNOSIS — I890 Lymphedema, not elsewhere classified: Secondary | ICD-10-CM | POA: Diagnosis not present

## 2018-09-18 DIAGNOSIS — I482 Chronic atrial fibrillation: Secondary | ICD-10-CM | POA: Diagnosis not present

## 2018-09-18 DIAGNOSIS — G4733 Obstructive sleep apnea (adult) (pediatric): Secondary | ICD-10-CM | POA: Diagnosis not present

## 2018-09-18 DIAGNOSIS — L97221 Non-pressure chronic ulcer of left calf limited to breakdown of skin: Secondary | ICD-10-CM | POA: Diagnosis not present

## 2018-09-18 DIAGNOSIS — L97829 Non-pressure chronic ulcer of other part of left lower leg with unspecified severity: Secondary | ICD-10-CM | POA: Diagnosis not present

## 2018-09-18 DIAGNOSIS — S81802A Unspecified open wound, left lower leg, initial encounter: Secondary | ICD-10-CM | POA: Diagnosis not present

## 2018-09-18 DIAGNOSIS — E11622 Type 2 diabetes mellitus with other skin ulcer: Secondary | ICD-10-CM | POA: Diagnosis not present

## 2018-09-22 ENCOUNTER — Other Ambulatory Visit (HOSPITAL_COMMUNITY): Payer: Self-pay | Admitting: Internal Medicine

## 2018-09-25 ENCOUNTER — Encounter (HOSPITAL_BASED_OUTPATIENT_CLINIC_OR_DEPARTMENT_OTHER): Payer: Medicare Other | Attending: Internal Medicine

## 2018-09-25 DIAGNOSIS — I2510 Atherosclerotic heart disease of native coronary artery without angina pectoris: Secondary | ICD-10-CM | POA: Diagnosis not present

## 2018-09-25 DIAGNOSIS — E1122 Type 2 diabetes mellitus with diabetic chronic kidney disease: Secondary | ICD-10-CM | POA: Insufficient documentation

## 2018-09-25 DIAGNOSIS — I872 Venous insufficiency (chronic) (peripheral): Secondary | ICD-10-CM | POA: Diagnosis not present

## 2018-09-25 DIAGNOSIS — N186 End stage renal disease: Secondary | ICD-10-CM | POA: Insufficient documentation

## 2018-09-25 DIAGNOSIS — I890 Lymphedema, not elsewhere classified: Secondary | ICD-10-CM | POA: Diagnosis not present

## 2018-09-25 DIAGNOSIS — Z872 Personal history of diseases of the skin and subcutaneous tissue: Secondary | ICD-10-CM | POA: Insufficient documentation

## 2018-09-25 DIAGNOSIS — I120 Hypertensive chronic kidney disease with stage 5 chronic kidney disease or end stage renal disease: Secondary | ICD-10-CM | POA: Insufficient documentation

## 2018-09-25 DIAGNOSIS — E1140 Type 2 diabetes mellitus with diabetic neuropathy, unspecified: Secondary | ICD-10-CM | POA: Insufficient documentation

## 2018-09-25 DIAGNOSIS — G4730 Sleep apnea, unspecified: Secondary | ICD-10-CM | POA: Diagnosis not present

## 2018-09-25 DIAGNOSIS — S81802A Unspecified open wound, left lower leg, initial encounter: Secondary | ICD-10-CM | POA: Diagnosis not present

## 2018-09-27 ENCOUNTER — Ambulatory Visit: Payer: Medicare Other | Admitting: Podiatry

## 2018-10-04 DIAGNOSIS — Z872 Personal history of diseases of the skin and subcutaneous tissue: Secondary | ICD-10-CM | POA: Diagnosis not present

## 2018-10-04 DIAGNOSIS — G4730 Sleep apnea, unspecified: Secondary | ICD-10-CM | POA: Diagnosis not present

## 2018-10-04 DIAGNOSIS — I872 Venous insufficiency (chronic) (peripheral): Secondary | ICD-10-CM | POA: Diagnosis not present

## 2018-10-04 DIAGNOSIS — I2510 Atherosclerotic heart disease of native coronary artery without angina pectoris: Secondary | ICD-10-CM | POA: Diagnosis not present

## 2018-10-04 DIAGNOSIS — I890 Lymphedema, not elsewhere classified: Secondary | ICD-10-CM | POA: Diagnosis not present

## 2018-10-04 DIAGNOSIS — I120 Hypertensive chronic kidney disease with stage 5 chronic kidney disease or end stage renal disease: Secondary | ICD-10-CM | POA: Diagnosis not present

## 2018-11-02 DIAGNOSIS — Z9119 Patient's noncompliance with other medical treatment and regimen: Secondary | ICD-10-CM | POA: Diagnosis not present

## 2018-11-02 DIAGNOSIS — I2510 Atherosclerotic heart disease of native coronary artery without angina pectoris: Secondary | ICD-10-CM | POA: Diagnosis not present

## 2018-11-02 DIAGNOSIS — I272 Pulmonary hypertension, unspecified: Secondary | ICD-10-CM | POA: Diagnosis not present

## 2018-11-02 DIAGNOSIS — R5381 Other malaise: Secondary | ICD-10-CM | POA: Diagnosis not present

## 2018-11-02 DIAGNOSIS — E1165 Type 2 diabetes mellitus with hyperglycemia: Secondary | ICD-10-CM | POA: Diagnosis not present

## 2018-11-02 DIAGNOSIS — Z794 Long term (current) use of insulin: Secondary | ICD-10-CM | POA: Diagnosis not present

## 2018-11-02 DIAGNOSIS — I959 Hypotension, unspecified: Secondary | ICD-10-CM | POA: Diagnosis not present

## 2018-11-02 DIAGNOSIS — I110 Hypertensive heart disease with heart failure: Secondary | ICD-10-CM | POA: Diagnosis not present

## 2018-11-02 DIAGNOSIS — R05 Cough: Secondary | ICD-10-CM | POA: Diagnosis not present

## 2018-11-02 DIAGNOSIS — R0902 Hypoxemia: Secondary | ICD-10-CM | POA: Diagnosis not present

## 2018-11-02 DIAGNOSIS — Z9114 Patient's other noncompliance with medication regimen: Secondary | ICD-10-CM | POA: Diagnosis not present

## 2018-11-02 DIAGNOSIS — I480 Paroxysmal atrial fibrillation: Secondary | ICD-10-CM | POA: Diagnosis not present

## 2018-11-02 DIAGNOSIS — Z7409 Other reduced mobility: Secondary | ICD-10-CM | POA: Diagnosis not present

## 2018-11-02 DIAGNOSIS — N189 Chronic kidney disease, unspecified: Secondary | ICD-10-CM | POA: Diagnosis not present

## 2018-11-02 DIAGNOSIS — E1140 Type 2 diabetes mellitus with diabetic neuropathy, unspecified: Secondary | ICD-10-CM | POA: Diagnosis not present

## 2018-11-02 DIAGNOSIS — E1122 Type 2 diabetes mellitus with diabetic chronic kidney disease: Secondary | ICD-10-CM | POA: Diagnosis not present

## 2018-11-02 DIAGNOSIS — R0602 Shortness of breath: Secondary | ICD-10-CM | POA: Diagnosis not present

## 2018-11-02 DIAGNOSIS — E039 Hypothyroidism, unspecified: Secondary | ICD-10-CM | POA: Diagnosis not present

## 2018-11-02 DIAGNOSIS — G4733 Obstructive sleep apnea (adult) (pediatric): Secondary | ICD-10-CM | POA: Diagnosis not present

## 2018-11-02 DIAGNOSIS — R609 Edema, unspecified: Secondary | ICD-10-CM | POA: Diagnosis not present

## 2018-11-02 DIAGNOSIS — I130 Hypertensive heart and chronic kidney disease with heart failure and stage 1 through stage 4 chronic kidney disease, or unspecified chronic kidney disease: Secondary | ICD-10-CM | POA: Diagnosis not present

## 2018-11-02 DIAGNOSIS — Z8249 Family history of ischemic heart disease and other diseases of the circulatory system: Secondary | ICD-10-CM | POA: Diagnosis not present

## 2018-11-02 DIAGNOSIS — D649 Anemia, unspecified: Secondary | ICD-10-CM | POA: Diagnosis not present

## 2018-11-02 DIAGNOSIS — R531 Weakness: Secondary | ICD-10-CM | POA: Diagnosis not present

## 2018-11-02 DIAGNOSIS — Z6841 Body Mass Index (BMI) 40.0 and over, adult: Secondary | ICD-10-CM | POA: Diagnosis not present

## 2018-11-02 DIAGNOSIS — I5023 Acute on chronic systolic (congestive) heart failure: Secondary | ICD-10-CM | POA: Diagnosis not present

## 2018-11-02 DIAGNOSIS — I509 Heart failure, unspecified: Secondary | ICD-10-CM | POA: Diagnosis not present

## 2018-11-02 DIAGNOSIS — E785 Hyperlipidemia, unspecified: Secondary | ICD-10-CM | POA: Diagnosis not present

## 2018-11-02 DIAGNOSIS — Z833 Family history of diabetes mellitus: Secondary | ICD-10-CM | POA: Diagnosis not present

## 2018-11-03 DIAGNOSIS — I130 Hypertensive heart and chronic kidney disease with heart failure and stage 1 through stage 4 chronic kidney disease, or unspecified chronic kidney disease: Secondary | ICD-10-CM | POA: Diagnosis not present

## 2018-11-03 DIAGNOSIS — I2510 Atherosclerotic heart disease of native coronary artery without angina pectoris: Secondary | ICD-10-CM | POA: Diagnosis not present

## 2018-11-03 DIAGNOSIS — Z833 Family history of diabetes mellitus: Secondary | ICD-10-CM | POA: Diagnosis not present

## 2018-11-03 DIAGNOSIS — E1140 Type 2 diabetes mellitus with diabetic neuropathy, unspecified: Secondary | ICD-10-CM | POA: Diagnosis not present

## 2018-11-03 DIAGNOSIS — Z9114 Patient's other noncompliance with medication regimen: Secondary | ICD-10-CM | POA: Diagnosis not present

## 2018-11-03 DIAGNOSIS — I110 Hypertensive heart disease with heart failure: Secondary | ICD-10-CM | POA: Diagnosis not present

## 2018-11-03 DIAGNOSIS — E1122 Type 2 diabetes mellitus with diabetic chronic kidney disease: Secondary | ICD-10-CM | POA: Diagnosis not present

## 2018-11-03 DIAGNOSIS — E119 Type 2 diabetes mellitus without complications: Secondary | ICD-10-CM | POA: Diagnosis not present

## 2018-11-03 DIAGNOSIS — Z6841 Body Mass Index (BMI) 40.0 and over, adult: Secondary | ICD-10-CM | POA: Diagnosis not present

## 2018-11-03 DIAGNOSIS — E785 Hyperlipidemia, unspecified: Secondary | ICD-10-CM | POA: Diagnosis not present

## 2018-11-03 DIAGNOSIS — E039 Hypothyroidism, unspecified: Secondary | ICD-10-CM | POA: Diagnosis not present

## 2018-11-03 DIAGNOSIS — G4733 Obstructive sleep apnea (adult) (pediatric): Secondary | ICD-10-CM | POA: Diagnosis not present

## 2018-11-03 DIAGNOSIS — Z8249 Family history of ischemic heart disease and other diseases of the circulatory system: Secondary | ICD-10-CM | POA: Diagnosis not present

## 2018-11-03 DIAGNOSIS — I5023 Acute on chronic systolic (congestive) heart failure: Secondary | ICD-10-CM | POA: Diagnosis not present

## 2018-11-03 DIAGNOSIS — N189 Chronic kidney disease, unspecified: Secondary | ICD-10-CM | POA: Diagnosis not present

## 2018-11-03 DIAGNOSIS — Z23 Encounter for immunization: Secondary | ICD-10-CM | POA: Diagnosis not present

## 2018-11-03 DIAGNOSIS — Z9119 Patient's noncompliance with other medical treatment and regimen: Secondary | ICD-10-CM | POA: Diagnosis not present

## 2018-11-03 DIAGNOSIS — I509 Heart failure, unspecified: Secondary | ICD-10-CM | POA: Diagnosis not present

## 2018-11-03 DIAGNOSIS — Z794 Long term (current) use of insulin: Secondary | ICD-10-CM | POA: Diagnosis not present

## 2018-11-03 DIAGNOSIS — I5033 Acute on chronic diastolic (congestive) heart failure: Secondary | ICD-10-CM | POA: Diagnosis not present

## 2018-11-03 DIAGNOSIS — R5381 Other malaise: Secondary | ICD-10-CM | POA: Diagnosis not present

## 2018-11-03 DIAGNOSIS — I480 Paroxysmal atrial fibrillation: Secondary | ICD-10-CM | POA: Diagnosis not present

## 2018-11-03 DIAGNOSIS — I959 Hypotension, unspecified: Secondary | ICD-10-CM | POA: Diagnosis not present

## 2018-11-03 DIAGNOSIS — I272 Pulmonary hypertension, unspecified: Secondary | ICD-10-CM | POA: Diagnosis not present

## 2018-11-04 DIAGNOSIS — Z9119 Patient's noncompliance with other medical treatment and regimen: Secondary | ICD-10-CM | POA: Diagnosis not present

## 2018-11-04 DIAGNOSIS — I4891 Unspecified atrial fibrillation: Secondary | ICD-10-CM | POA: Diagnosis not present

## 2018-11-04 DIAGNOSIS — I110 Hypertensive heart disease with heart failure: Secondary | ICD-10-CM | POA: Diagnosis not present

## 2018-11-04 DIAGNOSIS — G4733 Obstructive sleep apnea (adult) (pediatric): Secondary | ICD-10-CM | POA: Diagnosis not present

## 2018-11-04 DIAGNOSIS — I272 Pulmonary hypertension, unspecified: Secondary | ICD-10-CM | POA: Diagnosis not present

## 2018-11-04 DIAGNOSIS — Z794 Long term (current) use of insulin: Secondary | ICD-10-CM | POA: Diagnosis not present

## 2018-11-04 DIAGNOSIS — R9431 Abnormal electrocardiogram [ECG] [EKG]: Secondary | ICD-10-CM | POA: Diagnosis not present

## 2018-11-04 DIAGNOSIS — I5023 Acute on chronic systolic (congestive) heart failure: Secondary | ICD-10-CM | POA: Diagnosis not present

## 2018-11-04 DIAGNOSIS — Z9114 Patient's other noncompliance with medication regimen: Secondary | ICD-10-CM | POA: Diagnosis not present

## 2018-11-04 DIAGNOSIS — I4510 Unspecified right bundle-branch block: Secondary | ICD-10-CM | POA: Diagnosis not present

## 2018-11-04 DIAGNOSIS — E1140 Type 2 diabetes mellitus with diabetic neuropathy, unspecified: Secondary | ICD-10-CM | POA: Diagnosis not present

## 2018-11-04 DIAGNOSIS — R7989 Other specified abnormal findings of blood chemistry: Secondary | ICD-10-CM | POA: Diagnosis not present

## 2018-11-04 DIAGNOSIS — I130 Hypertensive heart and chronic kidney disease with heart failure and stage 1 through stage 4 chronic kidney disease, or unspecified chronic kidney disease: Secondary | ICD-10-CM | POA: Diagnosis not present

## 2018-11-04 DIAGNOSIS — I480 Paroxysmal atrial fibrillation: Secondary | ICD-10-CM | POA: Diagnosis not present

## 2018-11-04 DIAGNOSIS — Z6841 Body Mass Index (BMI) 40.0 and over, adult: Secondary | ICD-10-CM | POA: Diagnosis not present

## 2018-11-05 DIAGNOSIS — I272 Pulmonary hypertension, unspecified: Secondary | ICD-10-CM | POA: Diagnosis not present

## 2018-11-05 DIAGNOSIS — Z9119 Patient's noncompliance with other medical treatment and regimen: Secondary | ICD-10-CM | POA: Diagnosis not present

## 2018-11-05 DIAGNOSIS — I480 Paroxysmal atrial fibrillation: Secondary | ICD-10-CM | POA: Diagnosis not present

## 2018-11-05 DIAGNOSIS — R7989 Other specified abnormal findings of blood chemistry: Secondary | ICD-10-CM | POA: Diagnosis not present

## 2018-11-05 DIAGNOSIS — Z6841 Body Mass Index (BMI) 40.0 and over, adult: Secondary | ICD-10-CM | POA: Diagnosis not present

## 2018-11-05 DIAGNOSIS — E1140 Type 2 diabetes mellitus with diabetic neuropathy, unspecified: Secondary | ICD-10-CM | POA: Diagnosis not present

## 2018-11-05 DIAGNOSIS — I110 Hypertensive heart disease with heart failure: Secondary | ICD-10-CM | POA: Diagnosis not present

## 2018-11-05 DIAGNOSIS — Z9114 Patient's other noncompliance with medication regimen: Secondary | ICD-10-CM | POA: Diagnosis not present

## 2018-11-05 DIAGNOSIS — G4733 Obstructive sleep apnea (adult) (pediatric): Secondary | ICD-10-CM | POA: Diagnosis not present

## 2018-11-05 DIAGNOSIS — Z794 Long term (current) use of insulin: Secondary | ICD-10-CM | POA: Diagnosis not present

## 2018-11-05 DIAGNOSIS — I5023 Acute on chronic systolic (congestive) heart failure: Secondary | ICD-10-CM | POA: Diagnosis not present

## 2018-11-05 DIAGNOSIS — I130 Hypertensive heart and chronic kidney disease with heart failure and stage 1 through stage 4 chronic kidney disease, or unspecified chronic kidney disease: Secondary | ICD-10-CM | POA: Diagnosis not present

## 2018-11-06 DIAGNOSIS — I272 Pulmonary hypertension, unspecified: Secondary | ICD-10-CM | POA: Diagnosis not present

## 2018-11-06 DIAGNOSIS — Z794 Long term (current) use of insulin: Secondary | ICD-10-CM | POA: Diagnosis not present

## 2018-11-06 DIAGNOSIS — I480 Paroxysmal atrial fibrillation: Secondary | ICD-10-CM | POA: Diagnosis not present

## 2018-11-06 DIAGNOSIS — G4733 Obstructive sleep apnea (adult) (pediatric): Secondary | ICD-10-CM | POA: Diagnosis not present

## 2018-11-06 DIAGNOSIS — E1140 Type 2 diabetes mellitus with diabetic neuropathy, unspecified: Secondary | ICD-10-CM | POA: Diagnosis not present

## 2018-11-06 DIAGNOSIS — Z6841 Body Mass Index (BMI) 40.0 and over, adult: Secondary | ICD-10-CM | POA: Diagnosis not present

## 2018-11-06 DIAGNOSIS — Z9114 Patient's other noncompliance with medication regimen: Secondary | ICD-10-CM | POA: Diagnosis not present

## 2018-11-06 DIAGNOSIS — I5023 Acute on chronic systolic (congestive) heart failure: Secondary | ICD-10-CM | POA: Diagnosis not present

## 2018-11-06 DIAGNOSIS — Z9119 Patient's noncompliance with other medical treatment and regimen: Secondary | ICD-10-CM | POA: Diagnosis not present

## 2018-11-06 DIAGNOSIS — I130 Hypertensive heart and chronic kidney disease with heart failure and stage 1 through stage 4 chronic kidney disease, or unspecified chronic kidney disease: Secondary | ICD-10-CM | POA: Diagnosis not present

## 2018-11-06 DIAGNOSIS — R7989 Other specified abnormal findings of blood chemistry: Secondary | ICD-10-CM | POA: Diagnosis not present

## 2018-11-06 DIAGNOSIS — I110 Hypertensive heart disease with heart failure: Secondary | ICD-10-CM | POA: Diagnosis not present

## 2018-11-06 DIAGNOSIS — R079 Chest pain, unspecified: Secondary | ICD-10-CM | POA: Diagnosis not present

## 2018-11-06 DIAGNOSIS — R0602 Shortness of breath: Secondary | ICD-10-CM | POA: Diagnosis not present

## 2018-11-07 DIAGNOSIS — E8770 Fluid overload, unspecified: Secondary | ICD-10-CM | POA: Diagnosis not present

## 2018-11-07 DIAGNOSIS — R531 Weakness: Secondary | ICD-10-CM | POA: Diagnosis not present

## 2018-11-07 DIAGNOSIS — E119 Type 2 diabetes mellitus without complications: Secondary | ICD-10-CM | POA: Diagnosis not present

## 2018-11-07 DIAGNOSIS — I480 Paroxysmal atrial fibrillation: Secondary | ICD-10-CM | POA: Diagnosis not present

## 2018-11-07 DIAGNOSIS — Z794 Long term (current) use of insulin: Secondary | ICD-10-CM | POA: Diagnosis not present

## 2018-11-07 DIAGNOSIS — M2550 Pain in unspecified joint: Secondary | ICD-10-CM | POA: Diagnosis not present

## 2018-11-07 DIAGNOSIS — I130 Hypertensive heart and chronic kidney disease with heart failure and stage 1 through stage 4 chronic kidney disease, or unspecified chronic kidney disease: Secondary | ICD-10-CM | POA: Diagnosis not present

## 2018-11-07 DIAGNOSIS — I272 Pulmonary hypertension, unspecified: Secondary | ICD-10-CM | POA: Diagnosis not present

## 2018-11-07 DIAGNOSIS — I110 Hypertensive heart disease with heart failure: Secondary | ICD-10-CM | POA: Diagnosis not present

## 2018-11-07 DIAGNOSIS — E785 Hyperlipidemia, unspecified: Secondary | ICD-10-CM | POA: Diagnosis not present

## 2018-11-07 DIAGNOSIS — Z6841 Body Mass Index (BMI) 40.0 and over, adult: Secondary | ICD-10-CM | POA: Diagnosis not present

## 2018-11-07 DIAGNOSIS — I5033 Acute on chronic diastolic (congestive) heart failure: Secondary | ICD-10-CM | POA: Diagnosis not present

## 2018-11-07 DIAGNOSIS — G4733 Obstructive sleep apnea (adult) (pediatric): Secondary | ICD-10-CM | POA: Diagnosis not present

## 2018-11-07 DIAGNOSIS — Z9119 Patient's noncompliance with other medical treatment and regimen: Secondary | ICD-10-CM | POA: Diagnosis not present

## 2018-11-07 DIAGNOSIS — E039 Hypothyroidism, unspecified: Secondary | ICD-10-CM | POA: Diagnosis not present

## 2018-11-07 DIAGNOSIS — J9600 Acute respiratory failure, unspecified whether with hypoxia or hypercapnia: Secondary | ICD-10-CM | POA: Diagnosis not present

## 2018-11-07 DIAGNOSIS — I5023 Acute on chronic systolic (congestive) heart failure: Secondary | ICD-10-CM | POA: Diagnosis not present

## 2018-11-07 DIAGNOSIS — J9601 Acute respiratory failure with hypoxia: Secondary | ICD-10-CM | POA: Diagnosis not present

## 2018-11-07 DIAGNOSIS — Z7401 Bed confinement status: Secondary | ICD-10-CM | POA: Diagnosis not present

## 2018-11-08 DIAGNOSIS — I4892 Unspecified atrial flutter: Secondary | ICD-10-CM | POA: Diagnosis not present

## 2018-11-08 DIAGNOSIS — E039 Hypothyroidism, unspecified: Secondary | ICD-10-CM | POA: Diagnosis not present

## 2018-11-08 DIAGNOSIS — E119 Type 2 diabetes mellitus without complications: Secondary | ICD-10-CM | POA: Diagnosis not present

## 2018-11-08 DIAGNOSIS — I5033 Acute on chronic diastolic (congestive) heart failure: Secondary | ICD-10-CM | POA: Diagnosis not present

## 2018-11-08 DIAGNOSIS — G4733 Obstructive sleep apnea (adult) (pediatric): Secondary | ICD-10-CM | POA: Diagnosis not present

## 2018-11-08 DIAGNOSIS — I10 Essential (primary) hypertension: Secondary | ICD-10-CM | POA: Diagnosis not present

## 2018-11-08 DIAGNOSIS — E785 Hyperlipidemia, unspecified: Secondary | ICD-10-CM | POA: Diagnosis not present

## 2018-11-09 DIAGNOSIS — I5033 Acute on chronic diastolic (congestive) heart failure: Secondary | ICD-10-CM | POA: Diagnosis not present

## 2018-11-09 DIAGNOSIS — E119 Type 2 diabetes mellitus without complications: Secondary | ICD-10-CM | POA: Diagnosis not present

## 2018-11-09 DIAGNOSIS — E1165 Type 2 diabetes mellitus with hyperglycemia: Secondary | ICD-10-CM | POA: Diagnosis not present

## 2018-11-09 DIAGNOSIS — I480 Paroxysmal atrial fibrillation: Secondary | ICD-10-CM | POA: Diagnosis not present

## 2018-11-09 DIAGNOSIS — I4892 Unspecified atrial flutter: Secondary | ICD-10-CM | POA: Diagnosis not present

## 2018-11-09 DIAGNOSIS — R531 Weakness: Secondary | ICD-10-CM | POA: Diagnosis not present

## 2018-11-09 DIAGNOSIS — I10 Essential (primary) hypertension: Secondary | ICD-10-CM | POA: Diagnosis not present

## 2018-11-09 DIAGNOSIS — G4733 Obstructive sleep apnea (adult) (pediatric): Secondary | ICD-10-CM | POA: Diagnosis not present

## 2018-11-10 DIAGNOSIS — I5023 Acute on chronic systolic (congestive) heart failure: Secondary | ICD-10-CM | POA: Diagnosis not present

## 2018-11-10 DIAGNOSIS — E119 Type 2 diabetes mellitus without complications: Secondary | ICD-10-CM | POA: Diagnosis not present

## 2018-11-10 DIAGNOSIS — G4733 Obstructive sleep apnea (adult) (pediatric): Secondary | ICD-10-CM | POA: Diagnosis not present

## 2018-11-10 DIAGNOSIS — I480 Paroxysmal atrial fibrillation: Secondary | ICD-10-CM | POA: Diagnosis not present

## 2018-11-10 DIAGNOSIS — I10 Essential (primary) hypertension: Secondary | ICD-10-CM | POA: Diagnosis not present

## 2018-11-10 DIAGNOSIS — I5033 Acute on chronic diastolic (congestive) heart failure: Secondary | ICD-10-CM | POA: Diagnosis not present

## 2018-11-10 DIAGNOSIS — E1165 Type 2 diabetes mellitus with hyperglycemia: Secondary | ICD-10-CM | POA: Diagnosis not present

## 2018-11-10 DIAGNOSIS — I4892 Unspecified atrial flutter: Secondary | ICD-10-CM | POA: Diagnosis not present

## 2018-11-11 DIAGNOSIS — G4733 Obstructive sleep apnea (adult) (pediatric): Secondary | ICD-10-CM | POA: Diagnosis not present

## 2018-11-11 DIAGNOSIS — I10 Essential (primary) hypertension: Secondary | ICD-10-CM | POA: Diagnosis not present

## 2018-11-11 DIAGNOSIS — I4892 Unspecified atrial flutter: Secondary | ICD-10-CM | POA: Diagnosis not present

## 2018-11-11 DIAGNOSIS — I5033 Acute on chronic diastolic (congestive) heart failure: Secondary | ICD-10-CM | POA: Diagnosis not present

## 2018-11-11 DIAGNOSIS — E119 Type 2 diabetes mellitus without complications: Secondary | ICD-10-CM | POA: Diagnosis not present

## 2018-11-13 DIAGNOSIS — I4892 Unspecified atrial flutter: Secondary | ICD-10-CM | POA: Diagnosis not present

## 2018-11-13 DIAGNOSIS — I5033 Acute on chronic diastolic (congestive) heart failure: Secondary | ICD-10-CM | POA: Diagnosis not present

## 2018-11-13 DIAGNOSIS — G4733 Obstructive sleep apnea (adult) (pediatric): Secondary | ICD-10-CM | POA: Diagnosis not present

## 2018-11-13 DIAGNOSIS — I10 Essential (primary) hypertension: Secondary | ICD-10-CM | POA: Diagnosis not present

## 2018-11-13 DIAGNOSIS — E119 Type 2 diabetes mellitus without complications: Secondary | ICD-10-CM | POA: Diagnosis not present

## 2018-11-14 DIAGNOSIS — F4323 Adjustment disorder with mixed anxiety and depressed mood: Secondary | ICD-10-CM | POA: Diagnosis not present

## 2018-11-14 DIAGNOSIS — G4733 Obstructive sleep apnea (adult) (pediatric): Secondary | ICD-10-CM | POA: Diagnosis not present

## 2018-11-14 DIAGNOSIS — I4892 Unspecified atrial flutter: Secondary | ICD-10-CM | POA: Diagnosis not present

## 2018-11-14 DIAGNOSIS — I5033 Acute on chronic diastolic (congestive) heart failure: Secondary | ICD-10-CM | POA: Diagnosis not present

## 2018-11-14 DIAGNOSIS — E119 Type 2 diabetes mellitus without complications: Secondary | ICD-10-CM | POA: Diagnosis not present

## 2018-11-14 DIAGNOSIS — I10 Essential (primary) hypertension: Secondary | ICD-10-CM | POA: Diagnosis not present

## 2018-11-15 DIAGNOSIS — I5033 Acute on chronic diastolic (congestive) heart failure: Secondary | ICD-10-CM | POA: Diagnosis not present

## 2018-11-15 DIAGNOSIS — G4733 Obstructive sleep apnea (adult) (pediatric): Secondary | ICD-10-CM | POA: Diagnosis not present

## 2018-11-15 DIAGNOSIS — I4892 Unspecified atrial flutter: Secondary | ICD-10-CM | POA: Diagnosis not present

## 2018-11-15 DIAGNOSIS — E119 Type 2 diabetes mellitus without complications: Secondary | ICD-10-CM | POA: Diagnosis not present

## 2018-11-15 DIAGNOSIS — I10 Essential (primary) hypertension: Secondary | ICD-10-CM | POA: Diagnosis not present

## 2018-11-16 DIAGNOSIS — G4733 Obstructive sleep apnea (adult) (pediatric): Secondary | ICD-10-CM | POA: Diagnosis not present

## 2018-11-16 DIAGNOSIS — E119 Type 2 diabetes mellitus without complications: Secondary | ICD-10-CM | POA: Diagnosis not present

## 2018-11-16 DIAGNOSIS — I10 Essential (primary) hypertension: Secondary | ICD-10-CM | POA: Diagnosis not present

## 2018-11-16 DIAGNOSIS — I4892 Unspecified atrial flutter: Secondary | ICD-10-CM | POA: Diagnosis not present

## 2018-11-16 DIAGNOSIS — I5033 Acute on chronic diastolic (congestive) heart failure: Secondary | ICD-10-CM | POA: Diagnosis not present

## 2018-11-17 DIAGNOSIS — E119 Type 2 diabetes mellitus without complications: Secondary | ICD-10-CM | POA: Diagnosis not present

## 2018-11-17 DIAGNOSIS — I4892 Unspecified atrial flutter: Secondary | ICD-10-CM | POA: Diagnosis not present

## 2018-11-17 DIAGNOSIS — I5033 Acute on chronic diastolic (congestive) heart failure: Secondary | ICD-10-CM | POA: Diagnosis not present

## 2018-11-17 DIAGNOSIS — I10 Essential (primary) hypertension: Secondary | ICD-10-CM | POA: Diagnosis not present

## 2018-11-17 DIAGNOSIS — G4733 Obstructive sleep apnea (adult) (pediatric): Secondary | ICD-10-CM | POA: Diagnosis not present

## 2018-11-18 DIAGNOSIS — I5033 Acute on chronic diastolic (congestive) heart failure: Secondary | ICD-10-CM | POA: Diagnosis not present

## 2018-11-18 DIAGNOSIS — I4892 Unspecified atrial flutter: Secondary | ICD-10-CM | POA: Diagnosis not present

## 2018-11-18 DIAGNOSIS — E119 Type 2 diabetes mellitus without complications: Secondary | ICD-10-CM | POA: Diagnosis not present

## 2018-11-18 DIAGNOSIS — G4733 Obstructive sleep apnea (adult) (pediatric): Secondary | ICD-10-CM | POA: Diagnosis not present

## 2018-11-18 DIAGNOSIS — I10 Essential (primary) hypertension: Secondary | ICD-10-CM | POA: Diagnosis not present

## 2018-11-20 DIAGNOSIS — I4892 Unspecified atrial flutter: Secondary | ICD-10-CM | POA: Diagnosis not present

## 2018-11-20 DIAGNOSIS — E039 Hypothyroidism, unspecified: Secondary | ICD-10-CM | POA: Diagnosis not present

## 2018-11-20 DIAGNOSIS — R41841 Cognitive communication deficit: Secondary | ICD-10-CM | POA: Diagnosis not present

## 2018-11-20 DIAGNOSIS — E785 Hyperlipidemia, unspecified: Secondary | ICD-10-CM | POA: Diagnosis not present

## 2018-11-20 DIAGNOSIS — R2689 Other abnormalities of gait and mobility: Secondary | ICD-10-CM | POA: Diagnosis not present

## 2018-11-20 DIAGNOSIS — I5033 Acute on chronic diastolic (congestive) heart failure: Secondary | ICD-10-CM | POA: Diagnosis not present

## 2018-11-20 DIAGNOSIS — E119 Type 2 diabetes mellitus without complications: Secondary | ICD-10-CM | POA: Diagnosis not present

## 2018-11-20 DIAGNOSIS — E1165 Type 2 diabetes mellitus with hyperglycemia: Secondary | ICD-10-CM | POA: Diagnosis not present

## 2018-11-20 DIAGNOSIS — G4733 Obstructive sleep apnea (adult) (pediatric): Secondary | ICD-10-CM | POA: Diagnosis not present

## 2018-11-20 DIAGNOSIS — I10 Essential (primary) hypertension: Secondary | ICD-10-CM | POA: Diagnosis not present

## 2018-11-20 DIAGNOSIS — M6281 Muscle weakness (generalized): Secondary | ICD-10-CM | POA: Diagnosis not present

## 2018-11-20 DIAGNOSIS — R279 Unspecified lack of coordination: Secondary | ICD-10-CM | POA: Diagnosis not present

## 2018-11-20 DIAGNOSIS — I5023 Acute on chronic systolic (congestive) heart failure: Secondary | ICD-10-CM | POA: Diagnosis not present

## 2018-11-21 DIAGNOSIS — F4323 Adjustment disorder with mixed anxiety and depressed mood: Secondary | ICD-10-CM | POA: Diagnosis not present

## 2018-11-21 DIAGNOSIS — I4892 Unspecified atrial flutter: Secondary | ICD-10-CM | POA: Diagnosis not present

## 2018-11-21 DIAGNOSIS — E119 Type 2 diabetes mellitus without complications: Secondary | ICD-10-CM | POA: Diagnosis not present

## 2018-11-21 DIAGNOSIS — I5033 Acute on chronic diastolic (congestive) heart failure: Secondary | ICD-10-CM | POA: Diagnosis not present

## 2018-11-21 DIAGNOSIS — G4733 Obstructive sleep apnea (adult) (pediatric): Secondary | ICD-10-CM | POA: Diagnosis not present

## 2018-11-21 DIAGNOSIS — I10 Essential (primary) hypertension: Secondary | ICD-10-CM | POA: Diagnosis not present

## 2018-11-22 DIAGNOSIS — G4733 Obstructive sleep apnea (adult) (pediatric): Secondary | ICD-10-CM | POA: Diagnosis not present

## 2018-11-22 DIAGNOSIS — E119 Type 2 diabetes mellitus without complications: Secondary | ICD-10-CM | POA: Diagnosis not present

## 2018-11-22 DIAGNOSIS — I10 Essential (primary) hypertension: Secondary | ICD-10-CM | POA: Diagnosis not present

## 2018-11-22 DIAGNOSIS — I5033 Acute on chronic diastolic (congestive) heart failure: Secondary | ICD-10-CM | POA: Diagnosis not present

## 2018-11-22 DIAGNOSIS — I4892 Unspecified atrial flutter: Secondary | ICD-10-CM | POA: Diagnosis not present

## 2018-11-23 DIAGNOSIS — I10 Essential (primary) hypertension: Secondary | ICD-10-CM | POA: Diagnosis not present

## 2018-11-23 DIAGNOSIS — I5033 Acute on chronic diastolic (congestive) heart failure: Secondary | ICD-10-CM | POA: Diagnosis not present

## 2018-11-23 DIAGNOSIS — I4892 Unspecified atrial flutter: Secondary | ICD-10-CM | POA: Diagnosis not present

## 2018-11-23 DIAGNOSIS — G4733 Obstructive sleep apnea (adult) (pediatric): Secondary | ICD-10-CM | POA: Diagnosis not present

## 2018-11-23 DIAGNOSIS — E119 Type 2 diabetes mellitus without complications: Secondary | ICD-10-CM | POA: Diagnosis not present

## 2018-11-24 DIAGNOSIS — E119 Type 2 diabetes mellitus without complications: Secondary | ICD-10-CM | POA: Diagnosis not present

## 2018-11-24 DIAGNOSIS — I5033 Acute on chronic diastolic (congestive) heart failure: Secondary | ICD-10-CM | POA: Diagnosis not present

## 2018-11-24 DIAGNOSIS — G4733 Obstructive sleep apnea (adult) (pediatric): Secondary | ICD-10-CM | POA: Diagnosis not present

## 2018-11-24 DIAGNOSIS — I10 Essential (primary) hypertension: Secondary | ICD-10-CM | POA: Diagnosis not present

## 2018-11-24 DIAGNOSIS — I4892 Unspecified atrial flutter: Secondary | ICD-10-CM | POA: Diagnosis not present

## 2018-11-25 DIAGNOSIS — I5023 Acute on chronic systolic (congestive) heart failure: Secondary | ICD-10-CM | POA: Diagnosis not present

## 2018-11-25 DIAGNOSIS — I480 Paroxysmal atrial fibrillation: Secondary | ICD-10-CM | POA: Diagnosis not present

## 2018-11-25 DIAGNOSIS — I10 Essential (primary) hypertension: Secondary | ICD-10-CM | POA: Diagnosis not present

## 2018-11-25 DIAGNOSIS — E1165 Type 2 diabetes mellitus with hyperglycemia: Secondary | ICD-10-CM | POA: Diagnosis not present

## 2018-11-26 DIAGNOSIS — E119 Type 2 diabetes mellitus without complications: Secondary | ICD-10-CM | POA: Diagnosis not present

## 2018-11-26 DIAGNOSIS — I10 Essential (primary) hypertension: Secondary | ICD-10-CM | POA: Diagnosis not present

## 2018-11-26 DIAGNOSIS — I5033 Acute on chronic diastolic (congestive) heart failure: Secondary | ICD-10-CM | POA: Diagnosis not present

## 2018-11-26 DIAGNOSIS — I4892 Unspecified atrial flutter: Secondary | ICD-10-CM | POA: Diagnosis not present

## 2018-11-26 DIAGNOSIS — G4733 Obstructive sleep apnea (adult) (pediatric): Secondary | ICD-10-CM | POA: Diagnosis not present

## 2018-11-27 DIAGNOSIS — Z79899 Other long term (current) drug therapy: Secondary | ICD-10-CM | POA: Diagnosis not present

## 2018-11-28 DIAGNOSIS — G4733 Obstructive sleep apnea (adult) (pediatric): Secondary | ICD-10-CM | POA: Diagnosis not present

## 2018-11-28 DIAGNOSIS — I5033 Acute on chronic diastolic (congestive) heart failure: Secondary | ICD-10-CM | POA: Diagnosis not present

## 2018-11-28 DIAGNOSIS — I10 Essential (primary) hypertension: Secondary | ICD-10-CM | POA: Diagnosis not present

## 2018-11-28 DIAGNOSIS — E119 Type 2 diabetes mellitus without complications: Secondary | ICD-10-CM | POA: Diagnosis not present

## 2018-11-28 DIAGNOSIS — I4892 Unspecified atrial flutter: Secondary | ICD-10-CM | POA: Diagnosis not present

## 2018-11-29 DIAGNOSIS — I5033 Acute on chronic diastolic (congestive) heart failure: Secondary | ICD-10-CM | POA: Diagnosis not present

## 2018-11-29 DIAGNOSIS — I10 Essential (primary) hypertension: Secondary | ICD-10-CM | POA: Diagnosis not present

## 2018-11-29 DIAGNOSIS — E119 Type 2 diabetes mellitus without complications: Secondary | ICD-10-CM | POA: Diagnosis not present

## 2018-11-29 DIAGNOSIS — I4892 Unspecified atrial flutter: Secondary | ICD-10-CM | POA: Diagnosis not present

## 2018-11-29 DIAGNOSIS — G4733 Obstructive sleep apnea (adult) (pediatric): Secondary | ICD-10-CM | POA: Diagnosis not present

## 2018-11-30 DIAGNOSIS — I4892 Unspecified atrial flutter: Secondary | ICD-10-CM | POA: Diagnosis not present

## 2018-11-30 DIAGNOSIS — I5033 Acute on chronic diastolic (congestive) heart failure: Secondary | ICD-10-CM | POA: Diagnosis not present

## 2018-11-30 DIAGNOSIS — G4733 Obstructive sleep apnea (adult) (pediatric): Secondary | ICD-10-CM | POA: Diagnosis not present

## 2018-11-30 DIAGNOSIS — E1165 Type 2 diabetes mellitus with hyperglycemia: Secondary | ICD-10-CM | POA: Diagnosis not present

## 2018-11-30 DIAGNOSIS — F4323 Adjustment disorder with mixed anxiety and depressed mood: Secondary | ICD-10-CM | POA: Diagnosis not present

## 2018-11-30 DIAGNOSIS — I10 Essential (primary) hypertension: Secondary | ICD-10-CM | POA: Diagnosis not present

## 2018-11-30 DIAGNOSIS — I480 Paroxysmal atrial fibrillation: Secondary | ICD-10-CM | POA: Diagnosis not present

## 2018-11-30 DIAGNOSIS — I5023 Acute on chronic systolic (congestive) heart failure: Secondary | ICD-10-CM | POA: Diagnosis not present

## 2018-11-30 DIAGNOSIS — E119 Type 2 diabetes mellitus without complications: Secondary | ICD-10-CM | POA: Diagnosis not present

## 2018-12-01 DIAGNOSIS — I10 Essential (primary) hypertension: Secondary | ICD-10-CM | POA: Diagnosis not present

## 2018-12-01 DIAGNOSIS — E119 Type 2 diabetes mellitus without complications: Secondary | ICD-10-CM | POA: Diagnosis not present

## 2018-12-01 DIAGNOSIS — I4892 Unspecified atrial flutter: Secondary | ICD-10-CM | POA: Diagnosis not present

## 2018-12-01 DIAGNOSIS — G4733 Obstructive sleep apnea (adult) (pediatric): Secondary | ICD-10-CM | POA: Diagnosis not present

## 2018-12-01 DIAGNOSIS — I5033 Acute on chronic diastolic (congestive) heart failure: Secondary | ICD-10-CM | POA: Diagnosis not present

## 2018-12-02 DIAGNOSIS — I10 Essential (primary) hypertension: Secondary | ICD-10-CM | POA: Diagnosis not present

## 2018-12-02 DIAGNOSIS — I5033 Acute on chronic diastolic (congestive) heart failure: Secondary | ICD-10-CM | POA: Diagnosis not present

## 2018-12-02 DIAGNOSIS — G4733 Obstructive sleep apnea (adult) (pediatric): Secondary | ICD-10-CM | POA: Diagnosis not present

## 2018-12-02 DIAGNOSIS — E119 Type 2 diabetes mellitus without complications: Secondary | ICD-10-CM | POA: Diagnosis not present

## 2018-12-02 DIAGNOSIS — I4892 Unspecified atrial flutter: Secondary | ICD-10-CM | POA: Diagnosis not present

## 2018-12-03 DIAGNOSIS — I5033 Acute on chronic diastolic (congestive) heart failure: Secondary | ICD-10-CM | POA: Diagnosis not present

## 2018-12-03 DIAGNOSIS — I4892 Unspecified atrial flutter: Secondary | ICD-10-CM | POA: Diagnosis not present

## 2018-12-03 DIAGNOSIS — E119 Type 2 diabetes mellitus without complications: Secondary | ICD-10-CM | POA: Diagnosis not present

## 2018-12-03 DIAGNOSIS — I10 Essential (primary) hypertension: Secondary | ICD-10-CM | POA: Diagnosis not present

## 2018-12-03 DIAGNOSIS — G4733 Obstructive sleep apnea (adult) (pediatric): Secondary | ICD-10-CM | POA: Diagnosis not present

## 2018-12-04 DIAGNOSIS — I5033 Acute on chronic diastolic (congestive) heart failure: Secondary | ICD-10-CM | POA: Diagnosis not present

## 2018-12-04 DIAGNOSIS — G4733 Obstructive sleep apnea (adult) (pediatric): Secondary | ICD-10-CM | POA: Diagnosis not present

## 2018-12-04 DIAGNOSIS — I4892 Unspecified atrial flutter: Secondary | ICD-10-CM | POA: Diagnosis not present

## 2018-12-04 DIAGNOSIS — I10 Essential (primary) hypertension: Secondary | ICD-10-CM | POA: Diagnosis not present

## 2018-12-04 DIAGNOSIS — E119 Type 2 diabetes mellitus without complications: Secondary | ICD-10-CM | POA: Diagnosis not present

## 2018-12-05 DIAGNOSIS — F4323 Adjustment disorder with mixed anxiety and depressed mood: Secondary | ICD-10-CM | POA: Diagnosis not present

## 2018-12-05 DIAGNOSIS — I4892 Unspecified atrial flutter: Secondary | ICD-10-CM | POA: Diagnosis not present

## 2018-12-05 DIAGNOSIS — I5033 Acute on chronic diastolic (congestive) heart failure: Secondary | ICD-10-CM | POA: Diagnosis not present

## 2018-12-05 DIAGNOSIS — F418 Other specified anxiety disorders: Secondary | ICD-10-CM | POA: Diagnosis not present

## 2018-12-05 DIAGNOSIS — E119 Type 2 diabetes mellitus without complications: Secondary | ICD-10-CM | POA: Diagnosis not present

## 2018-12-05 DIAGNOSIS — I10 Essential (primary) hypertension: Secondary | ICD-10-CM | POA: Diagnosis not present

## 2018-12-05 DIAGNOSIS — G4733 Obstructive sleep apnea (adult) (pediatric): Secondary | ICD-10-CM | POA: Diagnosis not present

## 2018-12-06 DIAGNOSIS — G4733 Obstructive sleep apnea (adult) (pediatric): Secondary | ICD-10-CM | POA: Diagnosis not present

## 2018-12-06 DIAGNOSIS — I10 Essential (primary) hypertension: Secondary | ICD-10-CM | POA: Diagnosis not present

## 2018-12-06 DIAGNOSIS — I5033 Acute on chronic diastolic (congestive) heart failure: Secondary | ICD-10-CM | POA: Diagnosis not present

## 2018-12-06 DIAGNOSIS — I4892 Unspecified atrial flutter: Secondary | ICD-10-CM | POA: Diagnosis not present

## 2018-12-06 DIAGNOSIS — E119 Type 2 diabetes mellitus without complications: Secondary | ICD-10-CM | POA: Diagnosis not present

## 2018-12-07 DIAGNOSIS — I4892 Unspecified atrial flutter: Secondary | ICD-10-CM | POA: Diagnosis not present

## 2018-12-07 DIAGNOSIS — I10 Essential (primary) hypertension: Secondary | ICD-10-CM | POA: Diagnosis not present

## 2018-12-07 DIAGNOSIS — E119 Type 2 diabetes mellitus without complications: Secondary | ICD-10-CM | POA: Diagnosis not present

## 2018-12-07 DIAGNOSIS — G4733 Obstructive sleep apnea (adult) (pediatric): Secondary | ICD-10-CM | POA: Diagnosis not present

## 2018-12-07 DIAGNOSIS — I5033 Acute on chronic diastolic (congestive) heart failure: Secondary | ICD-10-CM | POA: Diagnosis not present

## 2018-12-08 DIAGNOSIS — I5033 Acute on chronic diastolic (congestive) heart failure: Secondary | ICD-10-CM | POA: Diagnosis not present

## 2018-12-08 DIAGNOSIS — I4892 Unspecified atrial flutter: Secondary | ICD-10-CM | POA: Diagnosis not present

## 2018-12-08 DIAGNOSIS — I480 Paroxysmal atrial fibrillation: Secondary | ICD-10-CM | POA: Diagnosis not present

## 2018-12-08 DIAGNOSIS — I952 Hypotension due to drugs: Secondary | ICD-10-CM | POA: Diagnosis not present

## 2018-12-08 DIAGNOSIS — G4733 Obstructive sleep apnea (adult) (pediatric): Secondary | ICD-10-CM | POA: Diagnosis not present

## 2018-12-08 DIAGNOSIS — I5023 Acute on chronic systolic (congestive) heart failure: Secondary | ICD-10-CM | POA: Diagnosis not present

## 2018-12-08 DIAGNOSIS — I10 Essential (primary) hypertension: Secondary | ICD-10-CM | POA: Diagnosis not present

## 2018-12-08 DIAGNOSIS — E119 Type 2 diabetes mellitus without complications: Secondary | ICD-10-CM | POA: Diagnosis not present

## 2018-12-08 DIAGNOSIS — R531 Weakness: Secondary | ICD-10-CM | POA: Diagnosis not present

## 2018-12-10 DIAGNOSIS — I10 Essential (primary) hypertension: Secondary | ICD-10-CM | POA: Diagnosis not present

## 2018-12-10 DIAGNOSIS — I5023 Acute on chronic systolic (congestive) heart failure: Secondary | ICD-10-CM | POA: Diagnosis not present

## 2018-12-10 DIAGNOSIS — E1165 Type 2 diabetes mellitus with hyperglycemia: Secondary | ICD-10-CM | POA: Diagnosis not present

## 2018-12-10 DIAGNOSIS — Z0000 Encounter for general adult medical examination without abnormal findings: Secondary | ICD-10-CM | POA: Diagnosis not present

## 2018-12-10 DIAGNOSIS — I480 Paroxysmal atrial fibrillation: Secondary | ICD-10-CM | POA: Diagnosis not present

## 2018-12-12 DIAGNOSIS — G4733 Obstructive sleep apnea (adult) (pediatric): Secondary | ICD-10-CM | POA: Diagnosis not present

## 2018-12-12 DIAGNOSIS — I4892 Unspecified atrial flutter: Secondary | ICD-10-CM | POA: Diagnosis not present

## 2018-12-12 DIAGNOSIS — E119 Type 2 diabetes mellitus without complications: Secondary | ICD-10-CM | POA: Diagnosis not present

## 2018-12-12 DIAGNOSIS — I5033 Acute on chronic diastolic (congestive) heart failure: Secondary | ICD-10-CM | POA: Diagnosis not present

## 2018-12-12 DIAGNOSIS — I10 Essential (primary) hypertension: Secondary | ICD-10-CM | POA: Diagnosis not present

## 2018-12-14 DIAGNOSIS — I5033 Acute on chronic diastolic (congestive) heart failure: Secondary | ICD-10-CM | POA: Diagnosis not present

## 2018-12-14 DIAGNOSIS — G4733 Obstructive sleep apnea (adult) (pediatric): Secondary | ICD-10-CM | POA: Diagnosis not present

## 2018-12-14 DIAGNOSIS — E119 Type 2 diabetes mellitus without complications: Secondary | ICD-10-CM | POA: Diagnosis not present

## 2018-12-14 DIAGNOSIS — I10 Essential (primary) hypertension: Secondary | ICD-10-CM | POA: Diagnosis not present

## 2018-12-14 DIAGNOSIS — I4892 Unspecified atrial flutter: Secondary | ICD-10-CM | POA: Diagnosis not present

## 2018-12-15 DIAGNOSIS — R531 Weakness: Secondary | ICD-10-CM | POA: Diagnosis not present

## 2018-12-15 DIAGNOSIS — G4733 Obstructive sleep apnea (adult) (pediatric): Secondary | ICD-10-CM | POA: Diagnosis not present

## 2018-12-15 DIAGNOSIS — E119 Type 2 diabetes mellitus without complications: Secondary | ICD-10-CM | POA: Diagnosis not present

## 2018-12-15 DIAGNOSIS — I480 Paroxysmal atrial fibrillation: Secondary | ICD-10-CM | POA: Diagnosis not present

## 2018-12-15 DIAGNOSIS — I4892 Unspecified atrial flutter: Secondary | ICD-10-CM | POA: Diagnosis not present

## 2018-12-15 DIAGNOSIS — I10 Essential (primary) hypertension: Secondary | ICD-10-CM | POA: Diagnosis not present

## 2018-12-15 DIAGNOSIS — I5023 Acute on chronic systolic (congestive) heart failure: Secondary | ICD-10-CM | POA: Diagnosis not present

## 2018-12-15 DIAGNOSIS — I952 Hypotension due to drugs: Secondary | ICD-10-CM | POA: Diagnosis not present

## 2018-12-15 DIAGNOSIS — I5033 Acute on chronic diastolic (congestive) heart failure: Secondary | ICD-10-CM | POA: Diagnosis not present

## 2018-12-18 DIAGNOSIS — I5033 Acute on chronic diastolic (congestive) heart failure: Secondary | ICD-10-CM | POA: Diagnosis not present

## 2018-12-18 DIAGNOSIS — I4892 Unspecified atrial flutter: Secondary | ICD-10-CM | POA: Diagnosis not present

## 2018-12-18 DIAGNOSIS — E119 Type 2 diabetes mellitus without complications: Secondary | ICD-10-CM | POA: Diagnosis not present

## 2018-12-18 DIAGNOSIS — I10 Essential (primary) hypertension: Secondary | ICD-10-CM | POA: Diagnosis not present

## 2018-12-18 DIAGNOSIS — G4733 Obstructive sleep apnea (adult) (pediatric): Secondary | ICD-10-CM | POA: Diagnosis not present

## 2018-12-19 DIAGNOSIS — I5033 Acute on chronic diastolic (congestive) heart failure: Secondary | ICD-10-CM | POA: Diagnosis not present

## 2018-12-19 DIAGNOSIS — I4892 Unspecified atrial flutter: Secondary | ICD-10-CM | POA: Diagnosis not present

## 2018-12-19 DIAGNOSIS — E119 Type 2 diabetes mellitus without complications: Secondary | ICD-10-CM | POA: Diagnosis not present

## 2018-12-19 DIAGNOSIS — G4733 Obstructive sleep apnea (adult) (pediatric): Secondary | ICD-10-CM | POA: Diagnosis not present

## 2018-12-19 DIAGNOSIS — I10 Essential (primary) hypertension: Secondary | ICD-10-CM | POA: Diagnosis not present

## 2018-12-20 DIAGNOSIS — I10 Essential (primary) hypertension: Secondary | ICD-10-CM | POA: Diagnosis not present

## 2018-12-20 DIAGNOSIS — E039 Hypothyroidism, unspecified: Secondary | ICD-10-CM | POA: Diagnosis not present

## 2018-12-20 DIAGNOSIS — M6281 Muscle weakness (generalized): Secondary | ICD-10-CM | POA: Diagnosis not present

## 2018-12-20 DIAGNOSIS — E119 Type 2 diabetes mellitus without complications: Secondary | ICD-10-CM | POA: Diagnosis not present

## 2018-12-20 DIAGNOSIS — G4733 Obstructive sleep apnea (adult) (pediatric): Secondary | ICD-10-CM | POA: Diagnosis not present

## 2018-12-20 DIAGNOSIS — I4892 Unspecified atrial flutter: Secondary | ICD-10-CM | POA: Diagnosis not present

## 2018-12-20 DIAGNOSIS — R41841 Cognitive communication deficit: Secondary | ICD-10-CM | POA: Diagnosis not present

## 2018-12-20 DIAGNOSIS — E785 Hyperlipidemia, unspecified: Secondary | ICD-10-CM | POA: Diagnosis not present

## 2018-12-20 DIAGNOSIS — R2689 Other abnormalities of gait and mobility: Secondary | ICD-10-CM | POA: Diagnosis not present

## 2018-12-20 DIAGNOSIS — I5033 Acute on chronic diastolic (congestive) heart failure: Secondary | ICD-10-CM | POA: Diagnosis not present

## 2018-12-21 DIAGNOSIS — I5033 Acute on chronic diastolic (congestive) heart failure: Secondary | ICD-10-CM | POA: Diagnosis not present

## 2018-12-21 DIAGNOSIS — I10 Essential (primary) hypertension: Secondary | ICD-10-CM | POA: Diagnosis not present

## 2018-12-21 DIAGNOSIS — I4892 Unspecified atrial flutter: Secondary | ICD-10-CM | POA: Diagnosis not present

## 2018-12-21 DIAGNOSIS — G4733 Obstructive sleep apnea (adult) (pediatric): Secondary | ICD-10-CM | POA: Diagnosis not present

## 2018-12-21 DIAGNOSIS — E119 Type 2 diabetes mellitus without complications: Secondary | ICD-10-CM | POA: Diagnosis not present

## 2018-12-22 DIAGNOSIS — I5033 Acute on chronic diastolic (congestive) heart failure: Secondary | ICD-10-CM | POA: Diagnosis not present

## 2018-12-22 DIAGNOSIS — I4892 Unspecified atrial flutter: Secondary | ICD-10-CM | POA: Diagnosis not present

## 2018-12-22 DIAGNOSIS — G4733 Obstructive sleep apnea (adult) (pediatric): Secondary | ICD-10-CM | POA: Diagnosis not present

## 2018-12-22 DIAGNOSIS — I10 Essential (primary) hypertension: Secondary | ICD-10-CM | POA: Diagnosis not present

## 2018-12-22 DIAGNOSIS — E119 Type 2 diabetes mellitus without complications: Secondary | ICD-10-CM | POA: Diagnosis not present

## 2018-12-23 DIAGNOSIS — E119 Type 2 diabetes mellitus without complications: Secondary | ICD-10-CM | POA: Diagnosis not present

## 2018-12-23 DIAGNOSIS — I10 Essential (primary) hypertension: Secondary | ICD-10-CM | POA: Diagnosis not present

## 2018-12-23 DIAGNOSIS — I5033 Acute on chronic diastolic (congestive) heart failure: Secondary | ICD-10-CM | POA: Diagnosis not present

## 2018-12-23 DIAGNOSIS — I4892 Unspecified atrial flutter: Secondary | ICD-10-CM | POA: Diagnosis not present

## 2018-12-23 DIAGNOSIS — G4733 Obstructive sleep apnea (adult) (pediatric): Secondary | ICD-10-CM | POA: Diagnosis not present

## 2018-12-25 DIAGNOSIS — I5033 Acute on chronic diastolic (congestive) heart failure: Secondary | ICD-10-CM | POA: Diagnosis not present

## 2018-12-25 DIAGNOSIS — G4733 Obstructive sleep apnea (adult) (pediatric): Secondary | ICD-10-CM | POA: Diagnosis not present

## 2018-12-25 DIAGNOSIS — I4892 Unspecified atrial flutter: Secondary | ICD-10-CM | POA: Diagnosis not present

## 2018-12-25 DIAGNOSIS — I10 Essential (primary) hypertension: Secondary | ICD-10-CM | POA: Diagnosis not present

## 2018-12-25 DIAGNOSIS — E119 Type 2 diabetes mellitus without complications: Secondary | ICD-10-CM | POA: Diagnosis not present

## 2018-12-26 DIAGNOSIS — I4892 Unspecified atrial flutter: Secondary | ICD-10-CM | POA: Diagnosis not present

## 2018-12-26 DIAGNOSIS — I5033 Acute on chronic diastolic (congestive) heart failure: Secondary | ICD-10-CM | POA: Diagnosis not present

## 2018-12-26 DIAGNOSIS — E119 Type 2 diabetes mellitus without complications: Secondary | ICD-10-CM | POA: Diagnosis not present

## 2018-12-26 DIAGNOSIS — I10 Essential (primary) hypertension: Secondary | ICD-10-CM | POA: Diagnosis not present

## 2018-12-26 DIAGNOSIS — G4733 Obstructive sleep apnea (adult) (pediatric): Secondary | ICD-10-CM | POA: Diagnosis not present

## 2018-12-27 DIAGNOSIS — E119 Type 2 diabetes mellitus without complications: Secondary | ICD-10-CM | POA: Diagnosis not present

## 2018-12-27 DIAGNOSIS — I5033 Acute on chronic diastolic (congestive) heart failure: Secondary | ICD-10-CM | POA: Diagnosis not present

## 2018-12-27 DIAGNOSIS — F4323 Adjustment disorder with mixed anxiety and depressed mood: Secondary | ICD-10-CM | POA: Diagnosis not present

## 2018-12-27 DIAGNOSIS — G4733 Obstructive sleep apnea (adult) (pediatric): Secondary | ICD-10-CM | POA: Diagnosis not present

## 2018-12-27 DIAGNOSIS — I10 Essential (primary) hypertension: Secondary | ICD-10-CM | POA: Diagnosis not present

## 2018-12-27 DIAGNOSIS — F418 Other specified anxiety disorders: Secondary | ICD-10-CM | POA: Diagnosis not present

## 2018-12-27 DIAGNOSIS — I4892 Unspecified atrial flutter: Secondary | ICD-10-CM | POA: Diagnosis not present

## 2018-12-28 DIAGNOSIS — G4733 Obstructive sleep apnea (adult) (pediatric): Secondary | ICD-10-CM | POA: Diagnosis not present

## 2018-12-28 DIAGNOSIS — I10 Essential (primary) hypertension: Secondary | ICD-10-CM | POA: Diagnosis not present

## 2018-12-28 DIAGNOSIS — E119 Type 2 diabetes mellitus without complications: Secondary | ICD-10-CM | POA: Diagnosis not present

## 2018-12-28 DIAGNOSIS — I4892 Unspecified atrial flutter: Secondary | ICD-10-CM | POA: Diagnosis not present

## 2018-12-28 DIAGNOSIS — I5033 Acute on chronic diastolic (congestive) heart failure: Secondary | ICD-10-CM | POA: Diagnosis not present

## 2018-12-29 DIAGNOSIS — G4733 Obstructive sleep apnea (adult) (pediatric): Secondary | ICD-10-CM | POA: Diagnosis not present

## 2018-12-29 DIAGNOSIS — E1165 Type 2 diabetes mellitus with hyperglycemia: Secondary | ICD-10-CM | POA: Diagnosis not present

## 2018-12-29 DIAGNOSIS — I10 Essential (primary) hypertension: Secondary | ICD-10-CM | POA: Diagnosis not present

## 2018-12-29 DIAGNOSIS — I5023 Acute on chronic systolic (congestive) heart failure: Secondary | ICD-10-CM | POA: Diagnosis not present

## 2018-12-29 DIAGNOSIS — E119 Type 2 diabetes mellitus without complications: Secondary | ICD-10-CM | POA: Diagnosis not present

## 2018-12-29 DIAGNOSIS — I5033 Acute on chronic diastolic (congestive) heart failure: Secondary | ICD-10-CM | POA: Diagnosis not present

## 2018-12-29 DIAGNOSIS — I4892 Unspecified atrial flutter: Secondary | ICD-10-CM | POA: Diagnosis not present

## 2019-01-01 DIAGNOSIS — G4733 Obstructive sleep apnea (adult) (pediatric): Secondary | ICD-10-CM | POA: Diagnosis not present

## 2019-01-01 DIAGNOSIS — E119 Type 2 diabetes mellitus without complications: Secondary | ICD-10-CM | POA: Diagnosis not present

## 2019-01-01 DIAGNOSIS — I10 Essential (primary) hypertension: Secondary | ICD-10-CM | POA: Diagnosis not present

## 2019-01-01 DIAGNOSIS — I4892 Unspecified atrial flutter: Secondary | ICD-10-CM | POA: Diagnosis not present

## 2019-01-01 DIAGNOSIS — I5033 Acute on chronic diastolic (congestive) heart failure: Secondary | ICD-10-CM | POA: Diagnosis not present

## 2019-01-03 DIAGNOSIS — I10 Essential (primary) hypertension: Secondary | ICD-10-CM | POA: Diagnosis not present

## 2019-01-03 DIAGNOSIS — I5033 Acute on chronic diastolic (congestive) heart failure: Secondary | ICD-10-CM | POA: Diagnosis not present

## 2019-01-03 DIAGNOSIS — E119 Type 2 diabetes mellitus without complications: Secondary | ICD-10-CM | POA: Diagnosis not present

## 2019-01-03 DIAGNOSIS — G4733 Obstructive sleep apnea (adult) (pediatric): Secondary | ICD-10-CM | POA: Diagnosis not present

## 2019-01-03 DIAGNOSIS — I4892 Unspecified atrial flutter: Secondary | ICD-10-CM | POA: Diagnosis not present

## 2019-01-04 DIAGNOSIS — G4733 Obstructive sleep apnea (adult) (pediatric): Secondary | ICD-10-CM | POA: Diagnosis not present

## 2019-01-04 DIAGNOSIS — I10 Essential (primary) hypertension: Secondary | ICD-10-CM | POA: Diagnosis not present

## 2019-01-04 DIAGNOSIS — F331 Major depressive disorder, recurrent, moderate: Secondary | ICD-10-CM | POA: Diagnosis not present

## 2019-01-04 DIAGNOSIS — F418 Other specified anxiety disorders: Secondary | ICD-10-CM | POA: Diagnosis not present

## 2019-01-04 DIAGNOSIS — I4892 Unspecified atrial flutter: Secondary | ICD-10-CM | POA: Diagnosis not present

## 2019-01-04 DIAGNOSIS — E119 Type 2 diabetes mellitus without complications: Secondary | ICD-10-CM | POA: Diagnosis not present

## 2019-01-04 DIAGNOSIS — I5033 Acute on chronic diastolic (congestive) heart failure: Secondary | ICD-10-CM | POA: Diagnosis not present

## 2019-01-05 DIAGNOSIS — I5023 Acute on chronic systolic (congestive) heart failure: Secondary | ICD-10-CM | POA: Diagnosis not present

## 2019-01-05 DIAGNOSIS — I480 Paroxysmal atrial fibrillation: Secondary | ICD-10-CM | POA: Diagnosis not present

## 2019-01-05 DIAGNOSIS — I10 Essential (primary) hypertension: Secondary | ICD-10-CM | POA: Diagnosis not present

## 2019-01-05 DIAGNOSIS — G4733 Obstructive sleep apnea (adult) (pediatric): Secondary | ICD-10-CM | POA: Diagnosis not present

## 2019-01-05 DIAGNOSIS — I5033 Acute on chronic diastolic (congestive) heart failure: Secondary | ICD-10-CM | POA: Diagnosis not present

## 2019-01-05 DIAGNOSIS — E119 Type 2 diabetes mellitus without complications: Secondary | ICD-10-CM | POA: Diagnosis not present

## 2019-01-05 DIAGNOSIS — E1165 Type 2 diabetes mellitus with hyperglycemia: Secondary | ICD-10-CM | POA: Diagnosis not present

## 2019-01-05 DIAGNOSIS — I4892 Unspecified atrial flutter: Secondary | ICD-10-CM | POA: Diagnosis not present

## 2019-01-08 DIAGNOSIS — I10 Essential (primary) hypertension: Secondary | ICD-10-CM | POA: Diagnosis not present

## 2019-01-08 DIAGNOSIS — I4892 Unspecified atrial flutter: Secondary | ICD-10-CM | POA: Diagnosis not present

## 2019-01-08 DIAGNOSIS — I5033 Acute on chronic diastolic (congestive) heart failure: Secondary | ICD-10-CM | POA: Diagnosis not present

## 2019-01-08 DIAGNOSIS — G4733 Obstructive sleep apnea (adult) (pediatric): Secondary | ICD-10-CM | POA: Diagnosis not present

## 2019-01-08 DIAGNOSIS — E119 Type 2 diabetes mellitus without complications: Secondary | ICD-10-CM | POA: Diagnosis not present

## 2019-01-09 DIAGNOSIS — E119 Type 2 diabetes mellitus without complications: Secondary | ICD-10-CM | POA: Diagnosis not present

## 2019-01-09 DIAGNOSIS — I5033 Acute on chronic diastolic (congestive) heart failure: Secondary | ICD-10-CM | POA: Diagnosis not present

## 2019-01-09 DIAGNOSIS — I4892 Unspecified atrial flutter: Secondary | ICD-10-CM | POA: Diagnosis not present

## 2019-01-09 DIAGNOSIS — G4733 Obstructive sleep apnea (adult) (pediatric): Secondary | ICD-10-CM | POA: Diagnosis not present

## 2019-01-09 DIAGNOSIS — I10 Essential (primary) hypertension: Secondary | ICD-10-CM | POA: Diagnosis not present

## 2019-01-10 DIAGNOSIS — E119 Type 2 diabetes mellitus without complications: Secondary | ICD-10-CM | POA: Diagnosis not present

## 2019-01-10 DIAGNOSIS — I4892 Unspecified atrial flutter: Secondary | ICD-10-CM | POA: Diagnosis not present

## 2019-01-10 DIAGNOSIS — I5033 Acute on chronic diastolic (congestive) heart failure: Secondary | ICD-10-CM | POA: Diagnosis not present

## 2019-01-10 DIAGNOSIS — I10 Essential (primary) hypertension: Secondary | ICD-10-CM | POA: Diagnosis not present

## 2019-01-10 DIAGNOSIS — G4733 Obstructive sleep apnea (adult) (pediatric): Secondary | ICD-10-CM | POA: Diagnosis not present

## 2019-01-11 DIAGNOSIS — I5033 Acute on chronic diastolic (congestive) heart failure: Secondary | ICD-10-CM | POA: Diagnosis not present

## 2019-01-11 DIAGNOSIS — I10 Essential (primary) hypertension: Secondary | ICD-10-CM | POA: Diagnosis not present

## 2019-01-11 DIAGNOSIS — G4733 Obstructive sleep apnea (adult) (pediatric): Secondary | ICD-10-CM | POA: Diagnosis not present

## 2019-01-11 DIAGNOSIS — I4892 Unspecified atrial flutter: Secondary | ICD-10-CM | POA: Diagnosis not present

## 2019-01-11 DIAGNOSIS — E119 Type 2 diabetes mellitus without complications: Secondary | ICD-10-CM | POA: Diagnosis not present

## 2019-01-12 DIAGNOSIS — I10 Essential (primary) hypertension: Secondary | ICD-10-CM | POA: Diagnosis not present

## 2019-01-12 DIAGNOSIS — G4733 Obstructive sleep apnea (adult) (pediatric): Secondary | ICD-10-CM | POA: Diagnosis not present

## 2019-01-12 DIAGNOSIS — I4892 Unspecified atrial flutter: Secondary | ICD-10-CM | POA: Diagnosis not present

## 2019-01-12 DIAGNOSIS — E119 Type 2 diabetes mellitus without complications: Secondary | ICD-10-CM | POA: Diagnosis not present

## 2019-01-12 DIAGNOSIS — I5033 Acute on chronic diastolic (congestive) heart failure: Secondary | ICD-10-CM | POA: Diagnosis not present

## 2019-01-13 DIAGNOSIS — I480 Paroxysmal atrial fibrillation: Secondary | ICD-10-CM | POA: Diagnosis not present

## 2019-01-13 DIAGNOSIS — I5023 Acute on chronic systolic (congestive) heart failure: Secondary | ICD-10-CM | POA: Diagnosis not present

## 2019-01-13 DIAGNOSIS — I10 Essential (primary) hypertension: Secondary | ICD-10-CM | POA: Diagnosis not present

## 2019-01-13 DIAGNOSIS — E1165 Type 2 diabetes mellitus with hyperglycemia: Secondary | ICD-10-CM | POA: Diagnosis not present

## 2019-01-15 DIAGNOSIS — E119 Type 2 diabetes mellitus without complications: Secondary | ICD-10-CM | POA: Diagnosis not present

## 2019-01-15 DIAGNOSIS — F418 Other specified anxiety disorders: Secondary | ICD-10-CM | POA: Diagnosis not present

## 2019-01-15 DIAGNOSIS — I10 Essential (primary) hypertension: Secondary | ICD-10-CM | POA: Diagnosis not present

## 2019-01-15 DIAGNOSIS — G4733 Obstructive sleep apnea (adult) (pediatric): Secondary | ICD-10-CM | POA: Diagnosis not present

## 2019-01-15 DIAGNOSIS — I5033 Acute on chronic diastolic (congestive) heart failure: Secondary | ICD-10-CM | POA: Diagnosis not present

## 2019-01-15 DIAGNOSIS — I4892 Unspecified atrial flutter: Secondary | ICD-10-CM | POA: Diagnosis not present

## 2019-01-16 DIAGNOSIS — G4733 Obstructive sleep apnea (adult) (pediatric): Secondary | ICD-10-CM | POA: Diagnosis not present

## 2019-01-16 DIAGNOSIS — F418 Other specified anxiety disorders: Secondary | ICD-10-CM | POA: Diagnosis not present

## 2019-01-16 DIAGNOSIS — F331 Major depressive disorder, recurrent, moderate: Secondary | ICD-10-CM | POA: Diagnosis not present

## 2019-01-16 DIAGNOSIS — I5033 Acute on chronic diastolic (congestive) heart failure: Secondary | ICD-10-CM | POA: Diagnosis not present

## 2019-01-16 DIAGNOSIS — I4892 Unspecified atrial flutter: Secondary | ICD-10-CM | POA: Diagnosis not present

## 2019-01-16 DIAGNOSIS — F4323 Adjustment disorder with mixed anxiety and depressed mood: Secondary | ICD-10-CM | POA: Diagnosis not present

## 2019-01-16 DIAGNOSIS — E119 Type 2 diabetes mellitus without complications: Secondary | ICD-10-CM | POA: Diagnosis not present

## 2019-01-16 DIAGNOSIS — I10 Essential (primary) hypertension: Secondary | ICD-10-CM | POA: Diagnosis not present

## 2019-01-17 DIAGNOSIS — I4892 Unspecified atrial flutter: Secondary | ICD-10-CM | POA: Diagnosis not present

## 2019-01-17 DIAGNOSIS — I10 Essential (primary) hypertension: Secondary | ICD-10-CM | POA: Diagnosis not present

## 2019-01-17 DIAGNOSIS — I5033 Acute on chronic diastolic (congestive) heart failure: Secondary | ICD-10-CM | POA: Diagnosis not present

## 2019-01-17 DIAGNOSIS — G4733 Obstructive sleep apnea (adult) (pediatric): Secondary | ICD-10-CM | POA: Diagnosis not present

## 2019-01-17 DIAGNOSIS — E119 Type 2 diabetes mellitus without complications: Secondary | ICD-10-CM | POA: Diagnosis not present

## 2019-01-24 DIAGNOSIS — F331 Major depressive disorder, recurrent, moderate: Secondary | ICD-10-CM | POA: Diagnosis not present

## 2019-01-24 DIAGNOSIS — E782 Mixed hyperlipidemia: Secondary | ICD-10-CM | POA: Diagnosis not present

## 2019-01-24 DIAGNOSIS — F418 Other specified anxiety disorders: Secondary | ICD-10-CM | POA: Diagnosis not present

## 2019-02-05 DIAGNOSIS — R531 Weakness: Secondary | ICD-10-CM | POA: Diagnosis not present

## 2019-02-05 DIAGNOSIS — I5022 Chronic systolic (congestive) heart failure: Secondary | ICD-10-CM | POA: Diagnosis not present

## 2019-02-05 DIAGNOSIS — E1165 Type 2 diabetes mellitus with hyperglycemia: Secondary | ICD-10-CM | POA: Diagnosis not present

## 2019-02-05 DIAGNOSIS — I480 Paroxysmal atrial fibrillation: Secondary | ICD-10-CM | POA: Diagnosis not present

## 2019-02-07 DIAGNOSIS — F4323 Adjustment disorder with mixed anxiety and depressed mood: Secondary | ICD-10-CM | POA: Diagnosis not present

## 2019-02-07 DIAGNOSIS — F418 Other specified anxiety disorders: Secondary | ICD-10-CM | POA: Diagnosis not present

## 2019-02-07 DIAGNOSIS — F331 Major depressive disorder, recurrent, moderate: Secondary | ICD-10-CM | POA: Diagnosis not present

## 2019-02-11 DIAGNOSIS — I5023 Acute on chronic systolic (congestive) heart failure: Secondary | ICD-10-CM | POA: Diagnosis not present

## 2019-02-11 DIAGNOSIS — E1165 Type 2 diabetes mellitus with hyperglycemia: Secondary | ICD-10-CM | POA: Diagnosis not present

## 2019-02-11 DIAGNOSIS — I10 Essential (primary) hypertension: Secondary | ICD-10-CM | POA: Diagnosis not present

## 2019-02-11 DIAGNOSIS — I480 Paroxysmal atrial fibrillation: Secondary | ICD-10-CM | POA: Diagnosis not present

## 2019-02-20 DIAGNOSIS — I10 Essential (primary) hypertension: Secondary | ICD-10-CM | POA: Diagnosis not present

## 2019-02-20 DIAGNOSIS — R635 Abnormal weight gain: Secondary | ICD-10-CM | POA: Diagnosis not present

## 2019-02-20 DIAGNOSIS — E0821 Diabetes mellitus due to underlying condition with diabetic nephropathy: Secondary | ICD-10-CM | POA: Diagnosis not present

## 2019-02-20 DIAGNOSIS — R05 Cough: Secondary | ICD-10-CM | POA: Diagnosis not present

## 2019-02-21 DIAGNOSIS — I10 Essential (primary) hypertension: Secondary | ICD-10-CM | POA: Diagnosis not present

## 2019-02-21 DIAGNOSIS — M6281 Muscle weakness (generalized): Secondary | ICD-10-CM | POA: Diagnosis not present

## 2019-02-21 DIAGNOSIS — D513 Other dietary vitamin B12 deficiency anemia: Secondary | ICD-10-CM | POA: Diagnosis not present

## 2019-02-21 DIAGNOSIS — I5022 Chronic systolic (congestive) heart failure: Secondary | ICD-10-CM | POA: Diagnosis not present

## 2019-02-21 DIAGNOSIS — Z79899 Other long term (current) drug therapy: Secondary | ICD-10-CM | POA: Diagnosis not present

## 2019-02-21 DIAGNOSIS — Z794 Long term (current) use of insulin: Secondary | ICD-10-CM | POA: Diagnosis not present

## 2019-02-21 DIAGNOSIS — E0821 Diabetes mellitus due to underlying condition with diabetic nephropathy: Secondary | ICD-10-CM | POA: Diagnosis not present

## 2019-02-26 DIAGNOSIS — F418 Other specified anxiety disorders: Secondary | ICD-10-CM | POA: Diagnosis not present

## 2019-02-26 DIAGNOSIS — F320 Major depressive disorder, single episode, mild: Secondary | ICD-10-CM | POA: Diagnosis not present

## 2019-02-28 DIAGNOSIS — F4323 Adjustment disorder with mixed anxiety and depressed mood: Secondary | ICD-10-CM | POA: Diagnosis not present

## 2019-02-28 DIAGNOSIS — F320 Major depressive disorder, single episode, mild: Secondary | ICD-10-CM | POA: Diagnosis not present

## 2019-02-28 DIAGNOSIS — F418 Other specified anxiety disorders: Secondary | ICD-10-CM | POA: Diagnosis not present

## 2019-03-05 DIAGNOSIS — I10 Essential (primary) hypertension: Secondary | ICD-10-CM | POA: Diagnosis not present

## 2019-03-05 DIAGNOSIS — I480 Paroxysmal atrial fibrillation: Secondary | ICD-10-CM | POA: Diagnosis not present

## 2019-03-05 DIAGNOSIS — E1165 Type 2 diabetes mellitus with hyperglycemia: Secondary | ICD-10-CM | POA: Diagnosis not present

## 2019-03-05 DIAGNOSIS — I5022 Chronic systolic (congestive) heart failure: Secondary | ICD-10-CM | POA: Diagnosis not present

## 2019-03-13 DIAGNOSIS — F331 Major depressive disorder, recurrent, moderate: Secondary | ICD-10-CM | POA: Diagnosis not present

## 2019-03-13 DIAGNOSIS — F4323 Adjustment disorder with mixed anxiety and depressed mood: Secondary | ICD-10-CM | POA: Diagnosis not present

## 2019-03-13 DIAGNOSIS — F418 Other specified anxiety disorders: Secondary | ICD-10-CM | POA: Diagnosis not present

## 2019-03-16 DIAGNOSIS — E1165 Type 2 diabetes mellitus with hyperglycemia: Secondary | ICD-10-CM | POA: Diagnosis not present

## 2019-03-16 DIAGNOSIS — B350 Tinea barbae and tinea capitis: Secondary | ICD-10-CM | POA: Diagnosis not present

## 2019-03-18 DIAGNOSIS — E1165 Type 2 diabetes mellitus with hyperglycemia: Secondary | ICD-10-CM | POA: Diagnosis not present

## 2019-03-18 DIAGNOSIS — K5901 Slow transit constipation: Secondary | ICD-10-CM | POA: Diagnosis not present

## 2019-03-18 DIAGNOSIS — I480 Paroxysmal atrial fibrillation: Secondary | ICD-10-CM | POA: Diagnosis not present

## 2019-03-18 DIAGNOSIS — I5023 Acute on chronic systolic (congestive) heart failure: Secondary | ICD-10-CM | POA: Diagnosis not present

## 2019-03-26 DIAGNOSIS — R05 Cough: Secondary | ICD-10-CM | POA: Diagnosis not present

## 2019-03-26 DIAGNOSIS — R0602 Shortness of breath: Secondary | ICD-10-CM | POA: Diagnosis not present

## 2019-03-28 DIAGNOSIS — Z79899 Other long term (current) drug therapy: Secondary | ICD-10-CM | POA: Diagnosis not present

## 2019-03-28 DIAGNOSIS — E0821 Diabetes mellitus due to underlying condition with diabetic nephropathy: Secondary | ICD-10-CM | POA: Diagnosis not present

## 2019-04-02 DIAGNOSIS — I5022 Chronic systolic (congestive) heart failure: Secondary | ICD-10-CM | POA: Diagnosis not present

## 2019-04-02 DIAGNOSIS — E1140 Type 2 diabetes mellitus with diabetic neuropathy, unspecified: Secondary | ICD-10-CM | POA: Diagnosis not present

## 2019-04-02 DIAGNOSIS — E0590 Thyrotoxicosis, unspecified without thyrotoxic crisis or storm: Secondary | ICD-10-CM | POA: Diagnosis not present

## 2019-04-02 DIAGNOSIS — R531 Weakness: Secondary | ICD-10-CM | POA: Diagnosis not present

## 2019-04-04 DIAGNOSIS — F418 Other specified anxiety disorders: Secondary | ICD-10-CM | POA: Diagnosis not present

## 2019-04-04 DIAGNOSIS — F4323 Adjustment disorder with mixed anxiety and depressed mood: Secondary | ICD-10-CM | POA: Diagnosis not present

## 2019-04-18 DIAGNOSIS — I5022 Chronic systolic (congestive) heart failure: Secondary | ICD-10-CM | POA: Diagnosis not present

## 2019-04-18 DIAGNOSIS — F331 Major depressive disorder, recurrent, moderate: Secondary | ICD-10-CM | POA: Diagnosis not present

## 2019-04-18 DIAGNOSIS — F418 Other specified anxiety disorders: Secondary | ICD-10-CM | POA: Diagnosis not present

## 2019-04-18 DIAGNOSIS — E0843 Diabetes mellitus due to underlying condition with diabetic autonomic (poly)neuropathy: Secondary | ICD-10-CM | POA: Diagnosis not present

## 2019-04-19 DIAGNOSIS — E1165 Type 2 diabetes mellitus with hyperglycemia: Secondary | ICD-10-CM | POA: Diagnosis not present

## 2019-04-19 DIAGNOSIS — I480 Paroxysmal atrial fibrillation: Secondary | ICD-10-CM | POA: Diagnosis not present

## 2019-04-19 DIAGNOSIS — I10 Essential (primary) hypertension: Secondary | ICD-10-CM | POA: Diagnosis not present

## 2019-04-19 DIAGNOSIS — I5023 Acute on chronic systolic (congestive) heart failure: Secondary | ICD-10-CM | POA: Diagnosis not present

## 2019-04-20 DIAGNOSIS — Z20828 Contact with and (suspected) exposure to other viral communicable diseases: Secondary | ICD-10-CM | POA: Diagnosis not present

## 2019-04-23 DIAGNOSIS — I5022 Chronic systolic (congestive) heart failure: Secondary | ICD-10-CM | POA: Diagnosis not present

## 2019-04-23 DIAGNOSIS — Z0000 Encounter for general adult medical examination without abnormal findings: Secondary | ICD-10-CM | POA: Diagnosis not present

## 2019-04-23 DIAGNOSIS — K5901 Slow transit constipation: Secondary | ICD-10-CM | POA: Diagnosis not present

## 2019-04-23 DIAGNOSIS — I10 Essential (primary) hypertension: Secondary | ICD-10-CM | POA: Diagnosis not present

## 2019-04-23 DIAGNOSIS — E1165 Type 2 diabetes mellitus with hyperglycemia: Secondary | ICD-10-CM | POA: Diagnosis not present

## 2019-04-25 DIAGNOSIS — Z03818 Encounter for observation for suspected exposure to other biological agents ruled out: Secondary | ICD-10-CM | POA: Diagnosis not present

## 2019-04-25 DIAGNOSIS — R278 Other lack of coordination: Secondary | ICD-10-CM | POA: Diagnosis not present

## 2019-04-25 DIAGNOSIS — I480 Paroxysmal atrial fibrillation: Secondary | ICD-10-CM | POA: Diagnosis not present

## 2019-04-25 DIAGNOSIS — R279 Unspecified lack of coordination: Secondary | ICD-10-CM | POA: Diagnosis not present

## 2019-04-25 DIAGNOSIS — I10 Essential (primary) hypertension: Secondary | ICD-10-CM | POA: Diagnosis not present

## 2019-04-25 DIAGNOSIS — F331 Major depressive disorder, recurrent, moderate: Secondary | ICD-10-CM | POA: Diagnosis not present

## 2019-04-25 DIAGNOSIS — R4701 Aphasia: Secondary | ICD-10-CM | POA: Diagnosis not present

## 2019-04-25 DIAGNOSIS — F411 Generalized anxiety disorder: Secondary | ICD-10-CM | POA: Diagnosis not present

## 2019-04-25 DIAGNOSIS — I5033 Acute on chronic diastolic (congestive) heart failure: Secondary | ICD-10-CM | POA: Diagnosis not present

## 2019-04-25 DIAGNOSIS — E7849 Other hyperlipidemia: Secondary | ICD-10-CM | POA: Diagnosis not present

## 2019-04-25 DIAGNOSIS — E038 Other specified hypothyroidism: Secondary | ICD-10-CM | POA: Diagnosis not present

## 2019-04-25 DIAGNOSIS — G4733 Obstructive sleep apnea (adult) (pediatric): Secondary | ICD-10-CM | POA: Diagnosis not present

## 2019-04-25 DIAGNOSIS — M1000 Idiopathic gout, unspecified site: Secondary | ICD-10-CM | POA: Diagnosis not present

## 2019-04-25 DIAGNOSIS — I2510 Atherosclerotic heart disease of native coronary artery without angina pectoris: Secondary | ICD-10-CM | POA: Diagnosis not present

## 2019-04-25 DIAGNOSIS — R262 Difficulty in walking, not elsewhere classified: Secondary | ICD-10-CM | POA: Diagnosis not present

## 2019-04-25 DIAGNOSIS — M6281 Muscle weakness (generalized): Secondary | ICD-10-CM | POA: Diagnosis not present

## 2019-04-26 DIAGNOSIS — R4701 Aphasia: Secondary | ICD-10-CM | POA: Diagnosis not present

## 2019-04-26 DIAGNOSIS — G4733 Obstructive sleep apnea (adult) (pediatric): Secondary | ICD-10-CM | POA: Diagnosis not present

## 2019-04-26 DIAGNOSIS — I10 Essential (primary) hypertension: Secondary | ICD-10-CM | POA: Diagnosis not present

## 2019-04-26 DIAGNOSIS — I5033 Acute on chronic diastolic (congestive) heart failure: Secondary | ICD-10-CM | POA: Diagnosis not present

## 2019-04-26 DIAGNOSIS — I480 Paroxysmal atrial fibrillation: Secondary | ICD-10-CM | POA: Diagnosis not present

## 2019-04-27 DIAGNOSIS — G4733 Obstructive sleep apnea (adult) (pediatric): Secondary | ICD-10-CM | POA: Diagnosis not present

## 2019-04-27 DIAGNOSIS — I5033 Acute on chronic diastolic (congestive) heart failure: Secondary | ICD-10-CM | POA: Diagnosis not present

## 2019-04-27 DIAGNOSIS — I10 Essential (primary) hypertension: Secondary | ICD-10-CM | POA: Diagnosis not present

## 2019-04-27 DIAGNOSIS — R4701 Aphasia: Secondary | ICD-10-CM | POA: Diagnosis not present

## 2019-04-27 DIAGNOSIS — I480 Paroxysmal atrial fibrillation: Secondary | ICD-10-CM | POA: Diagnosis not present

## 2019-04-30 DIAGNOSIS — Z7409 Other reduced mobility: Secondary | ICD-10-CM | POA: Diagnosis not present

## 2019-04-30 DIAGNOSIS — I509 Heart failure, unspecified: Secondary | ICD-10-CM | POA: Diagnosis not present

## 2019-04-30 DIAGNOSIS — E119 Type 2 diabetes mellitus without complications: Secondary | ICD-10-CM | POA: Diagnosis not present

## 2019-04-30 DIAGNOSIS — I10 Essential (primary) hypertension: Secondary | ICD-10-CM | POA: Diagnosis not present

## 2019-05-01 DIAGNOSIS — G4733 Obstructive sleep apnea (adult) (pediatric): Secondary | ICD-10-CM | POA: Diagnosis not present

## 2019-05-01 DIAGNOSIS — I5033 Acute on chronic diastolic (congestive) heart failure: Secondary | ICD-10-CM | POA: Diagnosis not present

## 2019-05-01 DIAGNOSIS — I480 Paroxysmal atrial fibrillation: Secondary | ICD-10-CM | POA: Diagnosis not present

## 2019-05-01 DIAGNOSIS — I10 Essential (primary) hypertension: Secondary | ICD-10-CM | POA: Diagnosis not present

## 2019-05-01 DIAGNOSIS — R4701 Aphasia: Secondary | ICD-10-CM | POA: Diagnosis not present

## 2019-05-02 DIAGNOSIS — I10 Essential (primary) hypertension: Secondary | ICD-10-CM | POA: Diagnosis not present

## 2019-05-02 DIAGNOSIS — G4733 Obstructive sleep apnea (adult) (pediatric): Secondary | ICD-10-CM | POA: Diagnosis not present

## 2019-05-02 DIAGNOSIS — R4701 Aphasia: Secondary | ICD-10-CM | POA: Diagnosis not present

## 2019-05-02 DIAGNOSIS — I480 Paroxysmal atrial fibrillation: Secondary | ICD-10-CM | POA: Diagnosis not present

## 2019-05-02 DIAGNOSIS — I5033 Acute on chronic diastolic (congestive) heart failure: Secondary | ICD-10-CM | POA: Diagnosis not present

## 2019-05-04 DIAGNOSIS — Z20828 Contact with and (suspected) exposure to other viral communicable diseases: Secondary | ICD-10-CM | POA: Diagnosis not present

## 2019-05-07 DIAGNOSIS — I5033 Acute on chronic diastolic (congestive) heart failure: Secondary | ICD-10-CM | POA: Diagnosis not present

## 2019-05-07 DIAGNOSIS — R4701 Aphasia: Secondary | ICD-10-CM | POA: Diagnosis not present

## 2019-05-07 DIAGNOSIS — E0843 Diabetes mellitus due to underlying condition with diabetic autonomic (poly)neuropathy: Secondary | ICD-10-CM | POA: Diagnosis not present

## 2019-05-07 DIAGNOSIS — I5022 Chronic systolic (congestive) heart failure: Secondary | ICD-10-CM | POA: Diagnosis not present

## 2019-05-07 DIAGNOSIS — I480 Paroxysmal atrial fibrillation: Secondary | ICD-10-CM | POA: Diagnosis not present

## 2019-05-07 DIAGNOSIS — I10 Essential (primary) hypertension: Secondary | ICD-10-CM | POA: Diagnosis not present

## 2019-05-07 DIAGNOSIS — G4733 Obstructive sleep apnea (adult) (pediatric): Secondary | ICD-10-CM | POA: Diagnosis not present

## 2019-05-08 DIAGNOSIS — G4733 Obstructive sleep apnea (adult) (pediatric): Secondary | ICD-10-CM | POA: Diagnosis not present

## 2019-05-08 DIAGNOSIS — I5033 Acute on chronic diastolic (congestive) heart failure: Secondary | ICD-10-CM | POA: Diagnosis not present

## 2019-05-08 DIAGNOSIS — I480 Paroxysmal atrial fibrillation: Secondary | ICD-10-CM | POA: Diagnosis not present

## 2019-05-08 DIAGNOSIS — R4701 Aphasia: Secondary | ICD-10-CM | POA: Diagnosis not present

## 2019-05-08 DIAGNOSIS — I10 Essential (primary) hypertension: Secondary | ICD-10-CM | POA: Diagnosis not present

## 2019-05-09 DIAGNOSIS — R7309 Other abnormal glucose: Secondary | ICD-10-CM | POA: Diagnosis not present

## 2019-05-09 DIAGNOSIS — I10 Essential (primary) hypertension: Secondary | ICD-10-CM | POA: Diagnosis not present

## 2019-05-09 DIAGNOSIS — E7849 Other hyperlipidemia: Secondary | ICD-10-CM | POA: Diagnosis not present

## 2019-05-09 DIAGNOSIS — G4733 Obstructive sleep apnea (adult) (pediatric): Secondary | ICD-10-CM | POA: Diagnosis not present

## 2019-05-09 DIAGNOSIS — E038 Other specified hypothyroidism: Secondary | ICD-10-CM | POA: Diagnosis not present

## 2019-05-09 DIAGNOSIS — R4701 Aphasia: Secondary | ICD-10-CM | POA: Diagnosis not present

## 2019-05-09 DIAGNOSIS — I480 Paroxysmal atrial fibrillation: Secondary | ICD-10-CM | POA: Diagnosis not present

## 2019-05-09 DIAGNOSIS — I5033 Acute on chronic diastolic (congestive) heart failure: Secondary | ICD-10-CM | POA: Diagnosis not present

## 2019-05-10 DIAGNOSIS — I10 Essential (primary) hypertension: Secondary | ICD-10-CM | POA: Diagnosis not present

## 2019-05-10 DIAGNOSIS — I5033 Acute on chronic diastolic (congestive) heart failure: Secondary | ICD-10-CM | POA: Diagnosis not present

## 2019-05-10 DIAGNOSIS — R4701 Aphasia: Secondary | ICD-10-CM | POA: Diagnosis not present

## 2019-05-10 DIAGNOSIS — I480 Paroxysmal atrial fibrillation: Secondary | ICD-10-CM | POA: Diagnosis not present

## 2019-05-10 DIAGNOSIS — G4733 Obstructive sleep apnea (adult) (pediatric): Secondary | ICD-10-CM | POA: Diagnosis not present

## 2019-05-11 DIAGNOSIS — I5033 Acute on chronic diastolic (congestive) heart failure: Secondary | ICD-10-CM | POA: Diagnosis not present

## 2019-05-11 DIAGNOSIS — I10 Essential (primary) hypertension: Secondary | ICD-10-CM | POA: Diagnosis not present

## 2019-05-11 DIAGNOSIS — G4733 Obstructive sleep apnea (adult) (pediatric): Secondary | ICD-10-CM | POA: Diagnosis not present

## 2019-05-11 DIAGNOSIS — I480 Paroxysmal atrial fibrillation: Secondary | ICD-10-CM | POA: Diagnosis not present

## 2019-05-11 DIAGNOSIS — R4701 Aphasia: Secondary | ICD-10-CM | POA: Diagnosis not present

## 2019-05-14 DIAGNOSIS — I480 Paroxysmal atrial fibrillation: Secondary | ICD-10-CM | POA: Diagnosis not present

## 2019-05-14 DIAGNOSIS — I10 Essential (primary) hypertension: Secondary | ICD-10-CM | POA: Diagnosis not present

## 2019-05-14 DIAGNOSIS — G4733 Obstructive sleep apnea (adult) (pediatric): Secondary | ICD-10-CM | POA: Diagnosis not present

## 2019-05-14 DIAGNOSIS — I5033 Acute on chronic diastolic (congestive) heart failure: Secondary | ICD-10-CM | POA: Diagnosis not present

## 2019-05-14 DIAGNOSIS — R4701 Aphasia: Secondary | ICD-10-CM | POA: Diagnosis not present

## 2019-05-15 DIAGNOSIS — I5033 Acute on chronic diastolic (congestive) heart failure: Secondary | ICD-10-CM | POA: Diagnosis not present

## 2019-05-15 DIAGNOSIS — G4733 Obstructive sleep apnea (adult) (pediatric): Secondary | ICD-10-CM | POA: Diagnosis not present

## 2019-05-15 DIAGNOSIS — I480 Paroxysmal atrial fibrillation: Secondary | ICD-10-CM | POA: Diagnosis not present

## 2019-05-15 DIAGNOSIS — I10 Essential (primary) hypertension: Secondary | ICD-10-CM | POA: Diagnosis not present

## 2019-05-15 DIAGNOSIS — E119 Type 2 diabetes mellitus without complications: Secondary | ICD-10-CM | POA: Diagnosis not present

## 2019-05-15 DIAGNOSIS — R4701 Aphasia: Secondary | ICD-10-CM | POA: Diagnosis not present

## 2019-05-15 DIAGNOSIS — Z7409 Other reduced mobility: Secondary | ICD-10-CM | POA: Diagnosis not present

## 2019-05-15 DIAGNOSIS — I509 Heart failure, unspecified: Secondary | ICD-10-CM | POA: Diagnosis not present

## 2019-05-15 DIAGNOSIS — R41841 Cognitive communication deficit: Secondary | ICD-10-CM | POA: Diagnosis not present

## 2019-05-16 DIAGNOSIS — I5033 Acute on chronic diastolic (congestive) heart failure: Secondary | ICD-10-CM | POA: Diagnosis not present

## 2019-05-16 DIAGNOSIS — I10 Essential (primary) hypertension: Secondary | ICD-10-CM | POA: Diagnosis not present

## 2019-05-16 DIAGNOSIS — R4701 Aphasia: Secondary | ICD-10-CM | POA: Diagnosis not present

## 2019-05-16 DIAGNOSIS — I509 Heart failure, unspecified: Secondary | ICD-10-CM | POA: Diagnosis not present

## 2019-05-16 DIAGNOSIS — G4733 Obstructive sleep apnea (adult) (pediatric): Secondary | ICD-10-CM | POA: Diagnosis not present

## 2019-05-16 DIAGNOSIS — I480 Paroxysmal atrial fibrillation: Secondary | ICD-10-CM | POA: Diagnosis not present

## 2019-05-17 DIAGNOSIS — I480 Paroxysmal atrial fibrillation: Secondary | ICD-10-CM | POA: Diagnosis not present

## 2019-05-17 DIAGNOSIS — I10 Essential (primary) hypertension: Secondary | ICD-10-CM | POA: Diagnosis not present

## 2019-05-17 DIAGNOSIS — R4701 Aphasia: Secondary | ICD-10-CM | POA: Diagnosis not present

## 2019-05-17 DIAGNOSIS — I5033 Acute on chronic diastolic (congestive) heart failure: Secondary | ICD-10-CM | POA: Diagnosis not present

## 2019-05-17 DIAGNOSIS — G4733 Obstructive sleep apnea (adult) (pediatric): Secondary | ICD-10-CM | POA: Diagnosis not present

## 2019-05-18 DIAGNOSIS — R4701 Aphasia: Secondary | ICD-10-CM | POA: Diagnosis not present

## 2019-05-18 DIAGNOSIS — I10 Essential (primary) hypertension: Secondary | ICD-10-CM | POA: Diagnosis not present

## 2019-05-18 DIAGNOSIS — I5033 Acute on chronic diastolic (congestive) heart failure: Secondary | ICD-10-CM | POA: Diagnosis not present

## 2019-05-18 DIAGNOSIS — G4733 Obstructive sleep apnea (adult) (pediatric): Secondary | ICD-10-CM | POA: Diagnosis not present

## 2019-05-18 DIAGNOSIS — Z20828 Contact with and (suspected) exposure to other viral communicable diseases: Secondary | ICD-10-CM | POA: Diagnosis not present

## 2019-05-18 DIAGNOSIS — I480 Paroxysmal atrial fibrillation: Secondary | ICD-10-CM | POA: Diagnosis not present

## 2019-05-21 DIAGNOSIS — R278 Other lack of coordination: Secondary | ICD-10-CM | POA: Diagnosis not present

## 2019-05-21 DIAGNOSIS — M6281 Muscle weakness (generalized): Secondary | ICD-10-CM | POA: Diagnosis not present

## 2019-05-21 DIAGNOSIS — F411 Generalized anxiety disorder: Secondary | ICD-10-CM | POA: Diagnosis not present

## 2019-05-21 DIAGNOSIS — R279 Unspecified lack of coordination: Secondary | ICD-10-CM | POA: Diagnosis not present

## 2019-05-21 DIAGNOSIS — I2510 Atherosclerotic heart disease of native coronary artery without angina pectoris: Secondary | ICD-10-CM | POA: Diagnosis not present

## 2019-05-21 DIAGNOSIS — R262 Difficulty in walking, not elsewhere classified: Secondary | ICD-10-CM | POA: Diagnosis not present

## 2019-05-21 DIAGNOSIS — I480 Paroxysmal atrial fibrillation: Secondary | ICD-10-CM | POA: Diagnosis not present

## 2019-05-21 DIAGNOSIS — M1000 Idiopathic gout, unspecified site: Secondary | ICD-10-CM | POA: Diagnosis not present

## 2019-05-21 DIAGNOSIS — R4701 Aphasia: Secondary | ICD-10-CM | POA: Diagnosis not present

## 2019-05-21 DIAGNOSIS — E7849 Other hyperlipidemia: Secondary | ICD-10-CM | POA: Diagnosis not present

## 2019-05-21 DIAGNOSIS — G4733 Obstructive sleep apnea (adult) (pediatric): Secondary | ICD-10-CM | POA: Diagnosis not present

## 2019-05-21 DIAGNOSIS — Z03818 Encounter for observation for suspected exposure to other biological agents ruled out: Secondary | ICD-10-CM | POA: Diagnosis not present

## 2019-05-21 DIAGNOSIS — F331 Major depressive disorder, recurrent, moderate: Secondary | ICD-10-CM | POA: Diagnosis not present

## 2019-05-21 DIAGNOSIS — E038 Other specified hypothyroidism: Secondary | ICD-10-CM | POA: Diagnosis not present

## 2019-05-21 DIAGNOSIS — I5033 Acute on chronic diastolic (congestive) heart failure: Secondary | ICD-10-CM | POA: Diagnosis not present

## 2019-05-21 DIAGNOSIS — I10 Essential (primary) hypertension: Secondary | ICD-10-CM | POA: Diagnosis not present

## 2019-05-22 DIAGNOSIS — I480 Paroxysmal atrial fibrillation: Secondary | ICD-10-CM | POA: Diagnosis not present

## 2019-05-22 DIAGNOSIS — I5033 Acute on chronic diastolic (congestive) heart failure: Secondary | ICD-10-CM | POA: Diagnosis not present

## 2019-05-22 DIAGNOSIS — R4701 Aphasia: Secondary | ICD-10-CM | POA: Diagnosis not present

## 2019-05-22 DIAGNOSIS — I10 Essential (primary) hypertension: Secondary | ICD-10-CM | POA: Diagnosis not present

## 2019-05-22 DIAGNOSIS — G4733 Obstructive sleep apnea (adult) (pediatric): Secondary | ICD-10-CM | POA: Diagnosis not present

## 2019-05-23 DIAGNOSIS — G4733 Obstructive sleep apnea (adult) (pediatric): Secondary | ICD-10-CM | POA: Diagnosis not present

## 2019-05-23 DIAGNOSIS — I5033 Acute on chronic diastolic (congestive) heart failure: Secondary | ICD-10-CM | POA: Diagnosis not present

## 2019-05-23 DIAGNOSIS — I480 Paroxysmal atrial fibrillation: Secondary | ICD-10-CM | POA: Diagnosis not present

## 2019-05-23 DIAGNOSIS — R4701 Aphasia: Secondary | ICD-10-CM | POA: Diagnosis not present

## 2019-05-23 DIAGNOSIS — I10 Essential (primary) hypertension: Secondary | ICD-10-CM | POA: Diagnosis not present

## 2019-05-24 DIAGNOSIS — I10 Essential (primary) hypertension: Secondary | ICD-10-CM | POA: Diagnosis not present

## 2019-05-24 DIAGNOSIS — I5033 Acute on chronic diastolic (congestive) heart failure: Secondary | ICD-10-CM | POA: Diagnosis not present

## 2019-05-24 DIAGNOSIS — G4733 Obstructive sleep apnea (adult) (pediatric): Secondary | ICD-10-CM | POA: Diagnosis not present

## 2019-05-24 DIAGNOSIS — I480 Paroxysmal atrial fibrillation: Secondary | ICD-10-CM | POA: Diagnosis not present

## 2019-05-24 DIAGNOSIS — R4701 Aphasia: Secondary | ICD-10-CM | POA: Diagnosis not present

## 2019-05-25 DIAGNOSIS — G4733 Obstructive sleep apnea (adult) (pediatric): Secondary | ICD-10-CM | POA: Diagnosis not present

## 2019-05-25 DIAGNOSIS — I5033 Acute on chronic diastolic (congestive) heart failure: Secondary | ICD-10-CM | POA: Diagnosis not present

## 2019-05-25 DIAGNOSIS — I480 Paroxysmal atrial fibrillation: Secondary | ICD-10-CM | POA: Diagnosis not present

## 2019-05-25 DIAGNOSIS — R4701 Aphasia: Secondary | ICD-10-CM | POA: Diagnosis not present

## 2019-05-25 DIAGNOSIS — I10 Essential (primary) hypertension: Secondary | ICD-10-CM | POA: Diagnosis not present

## 2019-05-28 DIAGNOSIS — I10 Essential (primary) hypertension: Secondary | ICD-10-CM | POA: Diagnosis not present

## 2019-05-28 DIAGNOSIS — G4733 Obstructive sleep apnea (adult) (pediatric): Secondary | ICD-10-CM | POA: Diagnosis not present

## 2019-05-28 DIAGNOSIS — R4701 Aphasia: Secondary | ICD-10-CM | POA: Diagnosis not present

## 2019-05-28 DIAGNOSIS — I5033 Acute on chronic diastolic (congestive) heart failure: Secondary | ICD-10-CM | POA: Diagnosis not present

## 2019-05-28 DIAGNOSIS — Z20828 Contact with and (suspected) exposure to other viral communicable diseases: Secondary | ICD-10-CM | POA: Diagnosis not present

## 2019-05-28 DIAGNOSIS — I480 Paroxysmal atrial fibrillation: Secondary | ICD-10-CM | POA: Diagnosis not present

## 2019-05-29 DIAGNOSIS — I480 Paroxysmal atrial fibrillation: Secondary | ICD-10-CM | POA: Diagnosis not present

## 2019-05-29 DIAGNOSIS — G4733 Obstructive sleep apnea (adult) (pediatric): Secondary | ICD-10-CM | POA: Diagnosis not present

## 2019-05-29 DIAGNOSIS — I5033 Acute on chronic diastolic (congestive) heart failure: Secondary | ICD-10-CM | POA: Diagnosis not present

## 2019-05-29 DIAGNOSIS — I10 Essential (primary) hypertension: Secondary | ICD-10-CM | POA: Diagnosis not present

## 2019-05-29 DIAGNOSIS — R4701 Aphasia: Secondary | ICD-10-CM | POA: Diagnosis not present

## 2019-05-30 DIAGNOSIS — G4733 Obstructive sleep apnea (adult) (pediatric): Secondary | ICD-10-CM | POA: Diagnosis not present

## 2019-05-30 DIAGNOSIS — I5033 Acute on chronic diastolic (congestive) heart failure: Secondary | ICD-10-CM | POA: Diagnosis not present

## 2019-05-30 DIAGNOSIS — I10 Essential (primary) hypertension: Secondary | ICD-10-CM | POA: Diagnosis not present

## 2019-05-30 DIAGNOSIS — I480 Paroxysmal atrial fibrillation: Secondary | ICD-10-CM | POA: Diagnosis not present

## 2019-05-30 DIAGNOSIS — R4701 Aphasia: Secondary | ICD-10-CM | POA: Diagnosis not present

## 2019-05-30 DIAGNOSIS — Z20828 Contact with and (suspected) exposure to other viral communicable diseases: Secondary | ICD-10-CM | POA: Diagnosis not present

## 2019-05-31 DIAGNOSIS — G4733 Obstructive sleep apnea (adult) (pediatric): Secondary | ICD-10-CM | POA: Diagnosis not present

## 2019-05-31 DIAGNOSIS — I509 Heart failure, unspecified: Secondary | ICD-10-CM | POA: Diagnosis not present

## 2019-05-31 DIAGNOSIS — I10 Essential (primary) hypertension: Secondary | ICD-10-CM | POA: Diagnosis not present

## 2019-05-31 DIAGNOSIS — I480 Paroxysmal atrial fibrillation: Secondary | ICD-10-CM | POA: Diagnosis not present

## 2019-05-31 DIAGNOSIS — R5381 Other malaise: Secondary | ICD-10-CM | POA: Diagnosis not present

## 2019-05-31 DIAGNOSIS — I5033 Acute on chronic diastolic (congestive) heart failure: Secondary | ICD-10-CM | POA: Diagnosis not present

## 2019-05-31 DIAGNOSIS — R32 Unspecified urinary incontinence: Secondary | ICD-10-CM | POA: Diagnosis not present

## 2019-05-31 DIAGNOSIS — R4701 Aphasia: Secondary | ICD-10-CM | POA: Diagnosis not present

## 2019-05-31 DIAGNOSIS — Z7409 Other reduced mobility: Secondary | ICD-10-CM | POA: Diagnosis not present

## 2019-06-01 DIAGNOSIS — R4701 Aphasia: Secondary | ICD-10-CM | POA: Diagnosis not present

## 2019-06-01 DIAGNOSIS — Z20828 Contact with and (suspected) exposure to other viral communicable diseases: Secondary | ICD-10-CM | POA: Diagnosis not present

## 2019-06-01 DIAGNOSIS — I480 Paroxysmal atrial fibrillation: Secondary | ICD-10-CM | POA: Diagnosis not present

## 2019-06-01 DIAGNOSIS — G4733 Obstructive sleep apnea (adult) (pediatric): Secondary | ICD-10-CM | POA: Diagnosis not present

## 2019-06-01 DIAGNOSIS — I10 Essential (primary) hypertension: Secondary | ICD-10-CM | POA: Diagnosis not present

## 2019-06-01 DIAGNOSIS — I5033 Acute on chronic diastolic (congestive) heart failure: Secondary | ICD-10-CM | POA: Diagnosis not present

## 2019-06-04 DIAGNOSIS — I480 Paroxysmal atrial fibrillation: Secondary | ICD-10-CM | POA: Diagnosis not present

## 2019-06-04 DIAGNOSIS — I5033 Acute on chronic diastolic (congestive) heart failure: Secondary | ICD-10-CM | POA: Diagnosis not present

## 2019-06-04 DIAGNOSIS — I509 Heart failure, unspecified: Secondary | ICD-10-CM | POA: Diagnosis not present

## 2019-06-04 DIAGNOSIS — I10 Essential (primary) hypertension: Secondary | ICD-10-CM | POA: Diagnosis not present

## 2019-06-04 DIAGNOSIS — E0590 Thyrotoxicosis, unspecified without thyrotoxic crisis or storm: Secondary | ICD-10-CM | POA: Diagnosis not present

## 2019-06-04 DIAGNOSIS — R4701 Aphasia: Secondary | ICD-10-CM | POA: Diagnosis not present

## 2019-06-04 DIAGNOSIS — E119 Type 2 diabetes mellitus without complications: Secondary | ICD-10-CM | POA: Diagnosis not present

## 2019-06-04 DIAGNOSIS — E669 Obesity, unspecified: Secondary | ICD-10-CM | POA: Diagnosis not present

## 2019-06-04 DIAGNOSIS — G4733 Obstructive sleep apnea (adult) (pediatric): Secondary | ICD-10-CM | POA: Diagnosis not present

## 2019-06-11 DIAGNOSIS — Z20828 Contact with and (suspected) exposure to other viral communicable diseases: Secondary | ICD-10-CM | POA: Diagnosis not present

## 2019-06-25 DIAGNOSIS — Z20828 Contact with and (suspected) exposure to other viral communicable diseases: Secondary | ICD-10-CM | POA: Diagnosis not present

## 2019-07-04 DIAGNOSIS — R5381 Other malaise: Secondary | ICD-10-CM | POA: Diagnosis not present

## 2019-07-04 DIAGNOSIS — E119 Type 2 diabetes mellitus without complications: Secondary | ICD-10-CM | POA: Diagnosis not present

## 2019-07-04 DIAGNOSIS — I10 Essential (primary) hypertension: Secondary | ICD-10-CM | POA: Diagnosis not present

## 2019-07-04 DIAGNOSIS — Z20828 Contact with and (suspected) exposure to other viral communicable diseases: Secondary | ICD-10-CM | POA: Diagnosis not present

## 2019-07-04 DIAGNOSIS — R41841 Cognitive communication deficit: Secondary | ICD-10-CM | POA: Diagnosis not present

## 2019-07-09 DIAGNOSIS — Z20828 Contact with and (suspected) exposure to other viral communicable diseases: Secondary | ICD-10-CM | POA: Diagnosis not present

## 2019-07-10 DIAGNOSIS — M79641 Pain in right hand: Secondary | ICD-10-CM | POA: Diagnosis not present

## 2019-07-10 DIAGNOSIS — R278 Other lack of coordination: Secondary | ICD-10-CM | POA: Diagnosis not present

## 2019-07-10 DIAGNOSIS — E785 Hyperlipidemia, unspecified: Secondary | ICD-10-CM | POA: Diagnosis not present

## 2019-07-10 DIAGNOSIS — S0031XA Abrasion of nose, initial encounter: Secondary | ICD-10-CM | POA: Diagnosis not present

## 2019-07-10 DIAGNOSIS — I509 Heart failure, unspecified: Secondary | ICD-10-CM | POA: Diagnosis not present

## 2019-07-10 DIAGNOSIS — R531 Weakness: Secondary | ICD-10-CM | POA: Diagnosis not present

## 2019-07-10 DIAGNOSIS — M109 Gout, unspecified: Secondary | ICD-10-CM | POA: Diagnosis not present

## 2019-07-10 DIAGNOSIS — Z79899 Other long term (current) drug therapy: Secondary | ICD-10-CM | POA: Diagnosis not present

## 2019-07-10 DIAGNOSIS — S0990XA Unspecified injury of head, initial encounter: Secondary | ICD-10-CM | POA: Diagnosis not present

## 2019-07-10 DIAGNOSIS — I480 Paroxysmal atrial fibrillation: Secondary | ICD-10-CM | POA: Diagnosis not present

## 2019-07-10 DIAGNOSIS — S60021A Contusion of right index finger without damage to nail, initial encounter: Secondary | ICD-10-CM | POA: Diagnosis not present

## 2019-07-10 DIAGNOSIS — S60221A Contusion of right hand, initial encounter: Secondary | ICD-10-CM | POA: Diagnosis not present

## 2019-07-10 DIAGNOSIS — F329 Major depressive disorder, single episode, unspecified: Secondary | ICD-10-CM | POA: Diagnosis not present

## 2019-07-10 DIAGNOSIS — S0180XA Unspecified open wound of other part of head, initial encounter: Secondary | ICD-10-CM | POA: Diagnosis not present

## 2019-07-10 DIAGNOSIS — E039 Hypothyroidism, unspecified: Secondary | ICD-10-CM | POA: Diagnosis not present

## 2019-07-10 DIAGNOSIS — S0033XA Contusion of nose, initial encounter: Secondary | ICD-10-CM | POA: Diagnosis not present

## 2019-07-10 DIAGNOSIS — R4182 Altered mental status, unspecified: Secondary | ICD-10-CM | POA: Diagnosis not present

## 2019-07-10 DIAGNOSIS — I110 Hypertensive heart disease with heart failure: Secondary | ICD-10-CM | POA: Diagnosis not present

## 2019-07-10 DIAGNOSIS — S0993XA Unspecified injury of face, initial encounter: Secondary | ICD-10-CM | POA: Diagnosis not present

## 2019-08-03 DIAGNOSIS — Z20828 Contact with and (suspected) exposure to other viral communicable diseases: Secondary | ICD-10-CM | POA: Diagnosis not present

## 2019-08-20 DIAGNOSIS — E119 Type 2 diabetes mellitus without complications: Secondary | ICD-10-CM | POA: Diagnosis not present

## 2019-08-20 DIAGNOSIS — H109 Unspecified conjunctivitis: Secondary | ICD-10-CM | POA: Diagnosis not present

## 2019-08-20 DIAGNOSIS — Z7409 Other reduced mobility: Secondary | ICD-10-CM | POA: Diagnosis not present

## 2019-08-20 DIAGNOSIS — R5381 Other malaise: Secondary | ICD-10-CM | POA: Diagnosis not present

## 2019-08-30 DIAGNOSIS — I509 Heart failure, unspecified: Secondary | ICD-10-CM | POA: Diagnosis not present

## 2019-08-30 DIAGNOSIS — R32 Unspecified urinary incontinence: Secondary | ICD-10-CM | POA: Diagnosis not present

## 2019-08-30 DIAGNOSIS — R5381 Other malaise: Secondary | ICD-10-CM | POA: Diagnosis not present

## 2019-08-30 DIAGNOSIS — I2510 Atherosclerotic heart disease of native coronary artery without angina pectoris: Secondary | ICD-10-CM | POA: Diagnosis not present

## 2019-09-05 DIAGNOSIS — Z20828 Contact with and (suspected) exposure to other viral communicable diseases: Secondary | ICD-10-CM | POA: Diagnosis not present

## 2019-09-13 DIAGNOSIS — Z03818 Encounter for observation for suspected exposure to other biological agents ruled out: Secondary | ICD-10-CM | POA: Diagnosis not present

## 2019-10-03 DIAGNOSIS — E7849 Other hyperlipidemia: Secondary | ICD-10-CM | POA: Diagnosis not present

## 2019-10-03 DIAGNOSIS — R278 Other lack of coordination: Secondary | ICD-10-CM | POA: Diagnosis not present

## 2019-10-03 DIAGNOSIS — R633 Feeding difficulties: Secondary | ICD-10-CM | POA: Diagnosis not present

## 2019-10-03 DIAGNOSIS — Z03818 Encounter for observation for suspected exposure to other biological agents ruled out: Secondary | ICD-10-CM | POA: Diagnosis not present

## 2019-10-03 DIAGNOSIS — R4701 Aphasia: Secondary | ICD-10-CM | POA: Diagnosis not present

## 2019-10-03 DIAGNOSIS — R5381 Other malaise: Secondary | ICD-10-CM | POA: Diagnosis not present

## 2019-10-03 DIAGNOSIS — M6281 Muscle weakness (generalized): Secondary | ICD-10-CM | POA: Diagnosis not present

## 2019-10-03 DIAGNOSIS — M1000 Idiopathic gout, unspecified site: Secondary | ICD-10-CM | POA: Diagnosis not present

## 2019-10-03 DIAGNOSIS — Z7409 Other reduced mobility: Secondary | ICD-10-CM | POA: Diagnosis not present

## 2019-10-03 DIAGNOSIS — I480 Paroxysmal atrial fibrillation: Secondary | ICD-10-CM | POA: Diagnosis not present

## 2019-10-03 DIAGNOSIS — F411 Generalized anxiety disorder: Secondary | ICD-10-CM | POA: Diagnosis not present

## 2019-10-03 DIAGNOSIS — R293 Abnormal posture: Secondary | ICD-10-CM | POA: Diagnosis not present

## 2019-10-03 DIAGNOSIS — G4733 Obstructive sleep apnea (adult) (pediatric): Secondary | ICD-10-CM | POA: Diagnosis not present

## 2019-10-03 DIAGNOSIS — D649 Anemia, unspecified: Secondary | ICD-10-CM | POA: Diagnosis not present

## 2019-10-03 DIAGNOSIS — I10 Essential (primary) hypertension: Secondary | ICD-10-CM | POA: Diagnosis not present

## 2019-10-03 DIAGNOSIS — F331 Major depressive disorder, recurrent, moderate: Secondary | ICD-10-CM | POA: Diagnosis not present

## 2019-10-03 DIAGNOSIS — I2510 Atherosclerotic heart disease of native coronary artery without angina pectoris: Secondary | ICD-10-CM | POA: Diagnosis not present

## 2019-10-03 DIAGNOSIS — E038 Other specified hypothyroidism: Secondary | ICD-10-CM | POA: Diagnosis not present

## 2019-10-03 DIAGNOSIS — I5033 Acute on chronic diastolic (congestive) heart failure: Secondary | ICD-10-CM | POA: Diagnosis not present

## 2019-10-04 DIAGNOSIS — I5033 Acute on chronic diastolic (congestive) heart failure: Secondary | ICD-10-CM | POA: Diagnosis not present

## 2019-10-04 DIAGNOSIS — R293 Abnormal posture: Secondary | ICD-10-CM | POA: Diagnosis not present

## 2019-10-04 DIAGNOSIS — G4733 Obstructive sleep apnea (adult) (pediatric): Secondary | ICD-10-CM | POA: Diagnosis not present

## 2019-10-04 DIAGNOSIS — I480 Paroxysmal atrial fibrillation: Secondary | ICD-10-CM | POA: Diagnosis not present

## 2019-10-04 DIAGNOSIS — I10 Essential (primary) hypertension: Secondary | ICD-10-CM | POA: Diagnosis not present

## 2019-10-05 DIAGNOSIS — R293 Abnormal posture: Secondary | ICD-10-CM | POA: Diagnosis not present

## 2019-10-05 DIAGNOSIS — I10 Essential (primary) hypertension: Secondary | ICD-10-CM | POA: Diagnosis not present

## 2019-10-05 DIAGNOSIS — I5033 Acute on chronic diastolic (congestive) heart failure: Secondary | ICD-10-CM | POA: Diagnosis not present

## 2019-10-05 DIAGNOSIS — G4733 Obstructive sleep apnea (adult) (pediatric): Secondary | ICD-10-CM | POA: Diagnosis not present

## 2019-10-05 DIAGNOSIS — I480 Paroxysmal atrial fibrillation: Secondary | ICD-10-CM | POA: Diagnosis not present

## 2019-10-08 DIAGNOSIS — R293 Abnormal posture: Secondary | ICD-10-CM | POA: Diagnosis not present

## 2019-10-08 DIAGNOSIS — I480 Paroxysmal atrial fibrillation: Secondary | ICD-10-CM | POA: Diagnosis not present

## 2019-10-08 DIAGNOSIS — I5033 Acute on chronic diastolic (congestive) heart failure: Secondary | ICD-10-CM | POA: Diagnosis not present

## 2019-10-08 DIAGNOSIS — G4733 Obstructive sleep apnea (adult) (pediatric): Secondary | ICD-10-CM | POA: Diagnosis not present

## 2019-10-08 DIAGNOSIS — I10 Essential (primary) hypertension: Secondary | ICD-10-CM | POA: Diagnosis not present

## 2019-10-09 DIAGNOSIS — I10 Essential (primary) hypertension: Secondary | ICD-10-CM | POA: Diagnosis not present

## 2019-10-09 DIAGNOSIS — R293 Abnormal posture: Secondary | ICD-10-CM | POA: Diagnosis not present

## 2019-10-09 DIAGNOSIS — G4733 Obstructive sleep apnea (adult) (pediatric): Secondary | ICD-10-CM | POA: Diagnosis not present

## 2019-10-09 DIAGNOSIS — I5033 Acute on chronic diastolic (congestive) heart failure: Secondary | ICD-10-CM | POA: Diagnosis not present

## 2019-10-09 DIAGNOSIS — I480 Paroxysmal atrial fibrillation: Secondary | ICD-10-CM | POA: Diagnosis not present

## 2019-10-10 DIAGNOSIS — I10 Essential (primary) hypertension: Secondary | ICD-10-CM | POA: Diagnosis not present

## 2019-10-10 DIAGNOSIS — G4733 Obstructive sleep apnea (adult) (pediatric): Secondary | ICD-10-CM | POA: Diagnosis not present

## 2019-10-10 DIAGNOSIS — I5033 Acute on chronic diastolic (congestive) heart failure: Secondary | ICD-10-CM | POA: Diagnosis not present

## 2019-10-10 DIAGNOSIS — I480 Paroxysmal atrial fibrillation: Secondary | ICD-10-CM | POA: Diagnosis not present

## 2019-10-10 DIAGNOSIS — R293 Abnormal posture: Secondary | ICD-10-CM | POA: Diagnosis not present

## 2019-10-11 DIAGNOSIS — R293 Abnormal posture: Secondary | ICD-10-CM | POA: Diagnosis not present

## 2019-10-11 DIAGNOSIS — G4733 Obstructive sleep apnea (adult) (pediatric): Secondary | ICD-10-CM | POA: Diagnosis not present

## 2019-10-11 DIAGNOSIS — I10 Essential (primary) hypertension: Secondary | ICD-10-CM | POA: Diagnosis not present

## 2019-10-11 DIAGNOSIS — I5033 Acute on chronic diastolic (congestive) heart failure: Secondary | ICD-10-CM | POA: Diagnosis not present

## 2019-10-11 DIAGNOSIS — I480 Paroxysmal atrial fibrillation: Secondary | ICD-10-CM | POA: Diagnosis not present

## 2019-10-12 DIAGNOSIS — R293 Abnormal posture: Secondary | ICD-10-CM | POA: Diagnosis not present

## 2019-10-12 DIAGNOSIS — G4733 Obstructive sleep apnea (adult) (pediatric): Secondary | ICD-10-CM | POA: Diagnosis not present

## 2019-10-12 DIAGNOSIS — I480 Paroxysmal atrial fibrillation: Secondary | ICD-10-CM | POA: Diagnosis not present

## 2019-10-12 DIAGNOSIS — I5033 Acute on chronic diastolic (congestive) heart failure: Secondary | ICD-10-CM | POA: Diagnosis not present

## 2019-10-12 DIAGNOSIS — I10 Essential (primary) hypertension: Secondary | ICD-10-CM | POA: Diagnosis not present

## 2019-10-15 DIAGNOSIS — I10 Essential (primary) hypertension: Secondary | ICD-10-CM | POA: Diagnosis not present

## 2019-10-15 DIAGNOSIS — I480 Paroxysmal atrial fibrillation: Secondary | ICD-10-CM | POA: Diagnosis not present

## 2019-10-15 DIAGNOSIS — R293 Abnormal posture: Secondary | ICD-10-CM | POA: Diagnosis not present

## 2019-10-15 DIAGNOSIS — G4733 Obstructive sleep apnea (adult) (pediatric): Secondary | ICD-10-CM | POA: Diagnosis not present

## 2019-10-15 DIAGNOSIS — I5033 Acute on chronic diastolic (congestive) heart failure: Secondary | ICD-10-CM | POA: Diagnosis not present

## 2019-10-16 DIAGNOSIS — I480 Paroxysmal atrial fibrillation: Secondary | ICD-10-CM | POA: Diagnosis not present

## 2019-10-16 DIAGNOSIS — I10 Essential (primary) hypertension: Secondary | ICD-10-CM | POA: Diagnosis not present

## 2019-10-16 DIAGNOSIS — R293 Abnormal posture: Secondary | ICD-10-CM | POA: Diagnosis not present

## 2019-10-16 DIAGNOSIS — G4733 Obstructive sleep apnea (adult) (pediatric): Secondary | ICD-10-CM | POA: Diagnosis not present

## 2019-10-16 DIAGNOSIS — I5033 Acute on chronic diastolic (congestive) heart failure: Secondary | ICD-10-CM | POA: Diagnosis not present

## 2019-10-17 DIAGNOSIS — G4733 Obstructive sleep apnea (adult) (pediatric): Secondary | ICD-10-CM | POA: Diagnosis not present

## 2019-10-17 DIAGNOSIS — I480 Paroxysmal atrial fibrillation: Secondary | ICD-10-CM | POA: Diagnosis not present

## 2019-10-17 DIAGNOSIS — I10 Essential (primary) hypertension: Secondary | ICD-10-CM | POA: Diagnosis not present

## 2019-10-17 DIAGNOSIS — R293 Abnormal posture: Secondary | ICD-10-CM | POA: Diagnosis not present

## 2019-10-17 DIAGNOSIS — I5033 Acute on chronic diastolic (congestive) heart failure: Secondary | ICD-10-CM | POA: Diagnosis not present

## 2019-10-22 DIAGNOSIS — E038 Other specified hypothyroidism: Secondary | ICD-10-CM | POA: Diagnosis not present

## 2019-10-22 DIAGNOSIS — I10 Essential (primary) hypertension: Secondary | ICD-10-CM | POA: Diagnosis not present

## 2019-10-22 DIAGNOSIS — R7309 Other abnormal glucose: Secondary | ICD-10-CM | POA: Diagnosis not present

## 2019-10-23 DIAGNOSIS — I10 Essential (primary) hypertension: Secondary | ICD-10-CM | POA: Diagnosis not present

## 2019-10-23 DIAGNOSIS — E119 Type 2 diabetes mellitus without complications: Secondary | ICD-10-CM | POA: Diagnosis not present

## 2019-10-23 DIAGNOSIS — I509 Heart failure, unspecified: Secondary | ICD-10-CM | POA: Diagnosis not present

## 2019-10-23 DIAGNOSIS — R5381 Other malaise: Secondary | ICD-10-CM | POA: Diagnosis not present

## 2020-01-21 DEATH — deceased
